# Patient Record
Sex: Male | Born: 1960 | Race: White | Hispanic: No | Marital: Married | Smoking: Former smoker
Health system: Southern US, Community
[De-identification: ages and names within clinical notes are randomized; demographics above are authoritative.]

## PROBLEM LIST (undated history)

## (undated) DIAGNOSIS — M1990 Unspecified osteoarthritis, unspecified site: Secondary | ICD-10-CM

## (undated) DIAGNOSIS — K5900 Constipation, unspecified: Secondary | ICD-10-CM

## (undated) DIAGNOSIS — E119 Type 2 diabetes mellitus without complications: Secondary | ICD-10-CM

## (undated) DIAGNOSIS — Z8546 Personal history of malignant neoplasm of prostate: Secondary | ICD-10-CM

## (undated) DIAGNOSIS — N189 Chronic kidney disease, unspecified: Secondary | ICD-10-CM

## (undated) DIAGNOSIS — E1142 Type 2 diabetes mellitus with diabetic polyneuropathy: Secondary | ICD-10-CM

## (undated) DIAGNOSIS — K219 Gastro-esophageal reflux disease without esophagitis: Secondary | ICD-10-CM

## (undated) DIAGNOSIS — G4730 Sleep apnea, unspecified: Secondary | ICD-10-CM

## (undated) DIAGNOSIS — I739 Peripheral vascular disease, unspecified: Secondary | ICD-10-CM

## (undated) DIAGNOSIS — I10 Essential (primary) hypertension: Secondary | ICD-10-CM

## (undated) DIAGNOSIS — C61 Malignant neoplasm of prostate: Secondary | ICD-10-CM

## (undated) DIAGNOSIS — I639 Cerebral infarction, unspecified: Secondary | ICD-10-CM

## (undated) HISTORY — PX: PROSTATECTOMY: SHX69

## (undated) HISTORY — PX: TAYLOR BUNIONECTOMY: SHX2485

## (undated) HISTORY — PX: WISDOM TOOTH EXTRACTION: SHX21

## (undated) HISTORY — PX: HERNIA REPAIR: SHX51

## (undated) HISTORY — PX: CORONARY ARTERY BYPASS GRAFT: SHX141

---

## 2004-08-09 ENCOUNTER — Ambulatory Visit: Payer: Self-pay | Admitting: Family Medicine

## 2005-03-14 ENCOUNTER — Ambulatory Visit (HOSPITAL_COMMUNITY): Admission: RE | Admit: 2005-03-14 | Discharge: 2005-03-14 | Payer: Self-pay | Admitting: Neurosurgery

## 2005-06-08 ENCOUNTER — Encounter: Admission: RE | Admit: 2005-06-08 | Discharge: 2005-06-08 | Payer: Self-pay | Admitting: Neurosurgery

## 2005-06-20 ENCOUNTER — Encounter: Admission: RE | Admit: 2005-06-20 | Discharge: 2005-06-20 | Payer: Self-pay | Admitting: Neurosurgery

## 2005-10-27 ENCOUNTER — Other Ambulatory Visit: Payer: Self-pay

## 2005-11-02 ENCOUNTER — Ambulatory Visit: Payer: Self-pay | Admitting: Surgery

## 2005-11-02 ENCOUNTER — Other Ambulatory Visit: Payer: Self-pay

## 2006-06-15 ENCOUNTER — Encounter: Payer: Self-pay | Admitting: Orthopedic Surgery

## 2006-06-26 ENCOUNTER — Encounter: Payer: Self-pay | Admitting: Orthopedic Surgery

## 2006-07-27 ENCOUNTER — Encounter: Payer: Self-pay | Admitting: Orthopedic Surgery

## 2012-08-02 ENCOUNTER — Ambulatory Visit: Payer: Self-pay | Admitting: Unknown Physician Specialty

## 2012-08-03 LAB — PATHOLOGY REPORT

## 2012-11-01 ENCOUNTER — Emergency Department: Payer: Self-pay | Admitting: Emergency Medicine

## 2012-11-01 LAB — CBC
MCH: 27.8 pg (ref 26.0–34.0)
MCHC: 33.7 g/dL (ref 32.0–36.0)
MCV: 83 fL (ref 80–100)
Platelet: 223 10*3/uL (ref 150–440)

## 2012-11-01 LAB — COMPREHENSIVE METABOLIC PANEL
Anion Gap: 6 — ABNORMAL LOW (ref 7–16)
Bilirubin,Total: 0.4 mg/dL (ref 0.2–1.0)
Calcium, Total: 9 mg/dL (ref 8.5–10.1)
Chloride: 101 mmol/L (ref 98–107)
Co2: 28 mmol/L (ref 21–32)
Creatinine: 1.32 mg/dL — ABNORMAL HIGH (ref 0.60–1.30)
EGFR (Non-African Amer.): >60
SGPT (ALT): 118 U/L — ABNORMAL HIGH (ref 12–78)
Total Protein: 7.3 g/dL (ref 6.4–8.2)

## 2012-11-01 LAB — CK TOTAL AND CKMB (NOT AT ARMC): CK, Total: 111 U/L (ref 35–232)

## 2012-11-01 LAB — TROPONIN I: Troponin-I: <0.02 ng/mL

## 2012-11-01 LAB — PRO B NATRIURETIC PEPTIDE: B-Type Natriuretic Peptide: 7 pg/mL (ref 0–125)

## 2013-01-08 ENCOUNTER — Ambulatory Visit: Payer: Self-pay | Admitting: Anesthesiology

## 2013-01-09 ENCOUNTER — Ambulatory Visit: Payer: Self-pay | Admitting: Anesthesiology

## 2013-02-15 ENCOUNTER — Ambulatory Visit: Payer: Self-pay | Admitting: Anesthesiology

## 2013-03-06 ENCOUNTER — Observation Stay: Payer: Self-pay | Admitting: Internal Medicine

## 2013-03-06 LAB — LIPID PANEL: VLDL Cholesterol, Calc: 48 mg/dL — ABNORMAL HIGH (ref 5–40)

## 2013-03-06 LAB — BASIC METABOLIC PANEL
Anion Gap: 5 — ABNORMAL LOW (ref 7–16)
BUN: 18 mg/dL (ref 7–18)
Chloride: 104 mmol/L (ref 98–107)
Creatinine: 1.08 mg/dL (ref 0.60–1.30)
EGFR (African American): >60
EGFR (Non-African Amer.): >60
Osmolality: 287 (ref 275–301)

## 2013-03-06 LAB — CBC
HGB: 14.7 g/dL (ref 13.0–18.0)
MCH: 27.8 pg (ref 26.0–34.0)
MCV: 83 fL (ref 80–100)
RBC: 5.29 10*6/uL (ref 4.40–5.90)

## 2013-03-06 LAB — CK TOTAL AND CKMB (NOT AT ARMC)
CK, Total: 112 U/L (ref 35–232)
CK, Total: 77 U/L (ref 35–232)
CK-MB: 2.4 ng/mL (ref 0.5–3.6)

## 2013-03-06 LAB — HEMOGLOBIN A1C: Hemoglobin A1C: 10.3 % — ABNORMAL HIGH (ref 4.2–6.3)

## 2013-03-06 LAB — TROPONIN I: Troponin-I: <0.02 ng/mL

## 2013-03-07 LAB — CBC WITH DIFFERENTIAL/PLATELET
Basophil #: 0.1 10*3/uL (ref 0.0–0.1)
Lymphocyte #: 2.5 10*3/uL (ref 1.0–3.6)
Lymphocyte %: 30.3 %
MCH: 28.1 pg (ref 26.0–34.0)
MCV: 84 fL (ref 80–100)
Monocyte %: 9.3 %
Neutrophil %: 57.4 %
Platelet: 196 10*3/uL (ref 150–440)
WBC: 8.4 10*3/uL (ref 3.8–10.6)

## 2013-03-07 LAB — BASIC METABOLIC PANEL
Anion Gap: 7 (ref 7–16)
BUN: 18 mg/dL (ref 7–18)
Calcium, Total: 8.7 mg/dL (ref 8.5–10.1)
Sodium: 138 mmol/L (ref 136–145)

## 2013-03-07 LAB — CK TOTAL AND CKMB (NOT AT ARMC): CK, Total: 64 U/L (ref 35–232)

## 2013-03-07 LAB — TROPONIN I: Troponin-I: <0.02 ng/mL

## 2013-06-13 ENCOUNTER — Ambulatory Visit: Payer: Self-pay | Admitting: Family Medicine

## 2013-08-26 ENCOUNTER — Ambulatory Visit: Payer: Self-pay | Admitting: Anesthesiology

## 2013-09-23 ENCOUNTER — Ambulatory Visit: Payer: Self-pay | Admitting: Anesthesiology

## 2015-01-16 NOTE — H&P (Signed)
PATIENT NAME:  Chase Harris, Chase Harris MR#:  [id number] DATE OF BIRTH:  05/30/1961  DATE OF ADMISSION:  03/06/2013  ADMITTING PHYSICIAN: Gladstone Lighter, MD   PRIMARY CARE PHYSICIAN: Dion Body, MD    CHIEF COMPLAINT: Chest pain.   HISTORY OF PRESENT ILLNESS: Mr. Shakoor is a 54 year old obese Caucasian male with past medical history significant for hypertension, diabetes, obstructive sleep apnea on CPAP, arthritis and history of prostate cancer, comes to the hospital secondary to chest pain that started this morning. The patient said since he was a diabetic he had a stress test done every year earlier about 5 years ago and then stopped because all of his stress tests were normal. He has not had a stress test done in the past 5 years, has not had chest pain lately up until this morning at 10:30 when he was sitting and talking to his friends and all of a sudden felt like a cramping heavy pain on the left side of the chest radiating to his jaw, and it bothered his right jaw more.  It was not associated with any diaphoresis, dyspnea, nausea or vomiting. It lasted for a good while, and so he presented to the ER. He is started on aspirin, nitro and his first set of troponin is negative.  Because of his risk factors, he is being admitted under observation to rule out angina.   PAST MEDICAL HISTORY: 1.  Chronic low back pain secondary to degenerative disk disease.  2.  Hypertension.  3.  History of chronic bronchitis and emphysema.  4.  Obstructive sleep apnea on continuous positive airway pressure.  5.  Diabetes mellitus.  6.  Rheumatoid arthritis.  7.  History of prostate cancer.   PAST SURGICAL HISTORY:  1.  Prostate cancer surgery.  2.  Hernia surgeries.   ALLERGIES: ALLERGIC TO PENICILLIN.    HOME MEDICATIONS:   1.  Lantus 56 units in the morning and 80 units at bedtime.  2.  Humalog 50-50 mix 18 units subcutaneously twice a day, which he has not been taking for a long time.  3.   Glyburide 10 mg p.o. b.i.d.  4.  Aspirin 81 mg p.o. daily.  5.  Lisinopril/HCTZ 10/12.5 mg 1 tablet p.o. daily.  6.  Loratadine 10 mg p.o. daily.  7.  Metformin 1000 mg in the morning and 1500 mg at bedtime.  8.  Multivitamin 1 tablet daily.  9.  Pravastatin 20 mg p.o. daily.  10.  Tramadol 100 mg every 4 hours as needed for pain.  11.  Vitamin D3, 1 tablet p.o. daily.  12.  Zantac 150 mg p.o. at  bedtime.   SOCIAL HISTORY: Lives at home by himself.  Quit smoking more than 24 years ago.  Currently still working.  Occasional alcohol use.   FAMILY HISTORY: Mom with a-fib and thyroid disease and dad had back problems.   REVIEW OF SYSTEMS:   CONSTITUTIONAL: No fever, fatigue or weakness.  EYES: No blurred vision, double vision, inflammation or glaucoma.  ENT: No tinnitus, ear pain, hearing loss, epistaxis or discharge.  RESPIRATORY: No cough, wheeze, hemoptysis or COPD.  CARDIOVASCULAR: Positive for chest pain.  No orthopnea, edema, arrhythmia, palpitations or syncope.  GASTROINTESTINAL: No nausea, vomiting, diarrhea, abdominal pain, hematemesis or melena.  GENITOURINARY: No dysuria, hematuria, renal calculus, frequency or incontinence.  ENDOCRINE: No polyuria, nocturia, thyroid problems, heat or cold intolerance.  HEMATOLOGY: No anemia, easy bruising or bleeding.  SKIN: No acne, rash or lesions.  MUSCULOSKELETAL: Positive for  small joint pain secondary to his rheumatoid arthritis and chronic back pain with radiculopathy and pain radiating down his calves and both legs.  NEUROLOGICAL: No numbness, weakness, CVA, transient ischemic attack or seizures.  PSYCHOLOGICAL: No anxiety, insomnia or depression.   PHYSICAL EXAMINATION: VITAL SIGNS: Temperature 98.3 degrees Fahrenheit, pulse 88, respirations 20, blood pressure 164/83, pulse oximetry 96% on room air.  GENERAL: A heavily built, well-nourished male sitting in bed, not in any acute distress.  HEENT: Normocephalic, atraumatic. Pupils are  equal, round, reacting to light. Anicteric sclerae. Extraocular movements intact. Oropharynx clear without erythema, mass or exudates.  NECK: Supple. No thyromegaly, JVD or carotid bruits. No lymphadenopathy.  LUNGS: Moving air bilaterally. No wheeze or crackles. No use of accessory muscles for breathing.  CARDIOVASCULAR: S1, S2 regular rate and rhythm. No murmurs, rubs or gallops.  ABDOMEN: Obese, soft, nontender, nondistended. No hepatosplenomegaly. Normal bowel sounds.  EXTREMITIES: No pedal edema. No clubbing or cyanosis. 2+ dorsalis pedis pulses palpable bilaterally.  SKIN: No acne, rash or lesions.  LYMPHATICS: No cervical lymphadenopathy.  NEUROLOGICAL: Cranial nerves intact. No focal motor or sensory deficits.  PSYCHOLOGICAL: The patient is awake, alert, oriented x 3.   LABORATORY AND RADIOLOGICAL DATA:  WBC 7.8, hemoglobin 14.7, hematocrit 44.1, platelet count is 200.  Sodium 136, potassium 4.2, chloride 104, bicarbonate 27, BUN 18, creatinine 1.0, glucose 327, and calcium of 9.3.  First set  of troponin is less than 0.02. D-dimer is negative.   Chest x-ray showing clear lung fields. No acute disease of the chest. EKG normal sinus rhythm, heart rate of 89, no acute ST-T wave abnormalities.   ASSESSMENT AND PLAN: A 54 year old, obese man with history of hypertension, diabetes, hyperlipidemia and obstructive sleep apnea, comes in for chest pain.   1.  Chest pain, likely angina:  Admit under observation to telemetry. First set of troponin is negative. We will recycle further 2 sets and get Myoview in the a.m. Aspirin, nitro and statin will be started.  2.  Hypertension:  Continue his lisinopril/HCTZ at this time.  3.  Diabetes mellitus:  Check hemoglobin A1c. Sugars seem to be uncontrolled. Continue his Lantus b.i.d., sliding scale insulin, metformin and glyburide.  4.  Obstructive sleep apnea: Continue on his CPAP.  5.  Gastrointestinal and deep vein thrombosis prophylaxis on Protonix  and Lovenox.   CODE STATUS:  FULL CODE.     TIME SPENT ON ADMISSION: 50 minutes.    ____________________________ Gladstone Lighter, MD rk:cb D: 03/06/2013 17:17:10 ET T: 03/06/2013 17:33:19 ET JOB#: [id number]  cc: Gladstone Lighter, MD, <Dictator> Dion Body, MD Gladstone Lighter MD ELECTRONICALLY SIGNED 03/19/2013 15:04

## 2015-01-16 NOTE — H&P (Signed)
PATIENT NAME:  Chase Harris, Chase Harris MR#:  [id number] DATE OF BIRTH:  1961/07/23  DATE OF ADMISSION:  01/08/2013  CHIEF COMPLAINT: Chronic low back pain.   PROCEDURE: None.   HISTORY OF PRESENT ILLNESS: The patient is a pleasant 54 year old white male with long-standing history of low back pain. He has been previously seen by Dr. Glenna Fellows over in [place] and this was 8 years ago, at which point he followed there. A previous MRI shows evidence of degenerative disk disease at multiple levels, but this is unavailable to me at this time and has been requested. He has previously been on medication management that has helped with his pain including hydrocodone and Dr. Carloyn Manner is no longer working with him and subsequently he is without medication management at this time. He has been seen by Dr. Netty Starring, who put him on tramadol on this was ineffective. He is basically describing a low back pain that radiates in the posterior lateral legs, especially the calves and anterior thighs. The etiology of the pain is unknown, but has been present for greater than 10 years with a maximum VAS of 10, best of 5, average of 7. It is not influenced by time of day, but worse with sitting and prolonged standing and certain types of rotational movement. Hot packs, medication management and warm showers seem to help the pain. It is worse with daytime cramping and associated with an aching, gnawing type of pain. A previous MRI scan was obtained that shows evidence of multilevel degenerative disk disease and this has been requested. Medication management has helped.   PAST MEDICAL HISTORY: Significant for high blood pressure, bronchitis, history of sleep apnea, history of diabetes, rheumatoid arthritis with a history of prostate cancer.    SOCIAL HISTORY: He is separated with two children. He does not smoke, quit 24 years ago. 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   CURRENT MEDICATIONS: Include loratadine, aspirin low  dose once a day, metformin, hydrochlorothiazide, pravastatin, glyburide, multivitamin, vitamin D3, Lantus, tramadol and Humalog.   ALLERGIES: PENICILLIN.   PHYSICAL EXAMINATION: Reveals a pleasant white male in no acute distress. He is alert and oriented x 3, cooperative and compliant. Heart has regular rate and rhythm. Lungs are clear to auscultation. Pupils equally round and reactive to light. Extraocular muscles are intact. Inspection of the low back reveals some paraspinous muscle tenderness, but this is mild. He does have pain with extension and left lateral rotation in the standing position. This does reproduce some of his pain on the left side. It is also present on the right, but less dramatic than on the left. With the patient in the supine position, he does have calf cramping with straight leg raise bilaterally. Muscle tone and bulk is good. No fasciculations. Sensation is intact throughout the lower extremities. I could not elicit a patella or Achilles reflex on either leg.   ASSESSMENT:  1.  Multilevel degenerative disk disease with chronic low back pain and L5 and L3 radicular symptoms.  2.  Myofascial low back pain.  3.  Facetogenic low back pain.   PLAN:  1.  I am going to schedule him for some physical therapy at the next available date for evaluation and treatment for his low back pain.  2.  He is going to proceed with a lumbar epidural steroid secondary to the constant calf cramping and anterior thigh pain he is experiencing. I do think this would be beneficial to help get his pain under better  control. The risks, benefits of the procedure are reviewed with him in full detail. All questions answered and no guarantees are made. We will plan to do this within the next few days 3.  I am also going to start him on Vicodin 7.5/300 to take 1 or 2 tablets per day for pain control. 4.  He can use anti-inflammatories as well baseline pain control. 5.  He may be a candidate for a diagnostic  facet block as well.   ____________________________ Alvina Filbert. Andree Elk, MD jga:aw D: 01/08/2013 14:28:45 ET T: 01/08/2013 14:36:41 ET JOB#: [id number]  cc: Alvina Filbert. Andree Elk, MD, <Dictator> Dion Body, MD Alvina Filbert Keaisha Sublette MD ELECTRONICALLY SIGNED 01/09/2013 9:05

## 2015-01-16 NOTE — Discharge Summary (Signed)
PATIENT NAME:  Chase Harris, Chase Harris MR#:  [id number] DATE OF BIRTH:  20-Feb-1961  DATE OF ADMISSION:  03/06/2013 DATE OF DISCHARGE:  03/07/2013  REASON FOR ADMISSION: Chest pain.   HISTORY OF PRESENT ILLNESS: Please see the dictated HPI done by Dr. Tressia Miners on 03/06/2013.  PAST MEDICAL HISTORY: 1.  Obesity.  2.  Benign hypertension.  3.  Type 2 diabetes.  4.  Obstructive sleep apnea, on CPAP.  5.  Osteoarthritis.  6.  Prostate cancer.  7.  Degenerative disk disease with chronic back pain.  8.  Rheumatoid arthritis.   MEDICATIONS ON ADMISSION: Please see admission note.   ALLERGIES: PENICILLIN.   SOCIAL AND FAMILY HISTORY AND REVIEW OF SYSTEMS: As per admission note.  PHYSICAL EXAMINATION: The patient was in no acute distress. Vital signs were stable and he was afebrile. HEENT exam was unremarkable. Neck was supple without JVD. Lungs were clear. Cardiac exam revealed a regular rate and rhythm with normal S1 and S2. Abdomen was soft and nontender. Extremities were without edema. Neurologic exam was grossly nonfocal.   LABORATORY AND DIAGNOSTICS: Chest x-ray was unremarkable. EKG revealed no acute ST-T wave changes.   Initial cardiac enzymes were negative. D-dimer was normal.   HOSPITAL COURSE: The patient was admitted with chest pain worrisome for angina. He was observed on telemetry. His enzymes remained negative. The patient underwent stress Myoview testing on 03/07/2013 which was negative with no evidence of ischemia. The patient had no further chest pain. He was subsequently discharged home for further outpatient follow-up. He was discharged in stable condition.   DISCHARGE DIAGNOSES: 1.  Noncardiac chest pain.  2.  Gastroesophageal reflux disease with esophageal spasm.  3.  Obesity.  4.  Benign hypertension.  5.  Type 2 diabetes.  6.  Morbid obesity.  7.  Obstructive sleep apnea, on CPAP.   DISCHARGE MEDICATIONS: 1.  Claritin 10 mg p.o. daily.  2.  Aspirin 81 mg p.o.  daily.  3.  Metformin 1000 mg p.o. q. a.m. and 1500 mg p.o. q. p.m.  4.  Pravastatin 20 mg p.o. at bedtime.  5.  Glyburide 10 mg p.o. b.i.d.  6.  Multivitamin 1 p.o. daily.  7.  Vitamin D3 1000 units p.o. daily.  8.  Tramadol 50 mg 1 to 2 tablets p.o. q. 4 hours p.r.n. pain.  9.  Zestoretic 10/12.5 mg 1 p.o. daily.  10.  Zantac 150 mg p.o. at bedtime.  11.  Humalog 50-50 mix 18 units sub-Q t.i.d. before meals.  12.  Lantus 80 units sub-Q at bedtime.  13.  Lantus 56 units sub-Q every a.m.   FOLLOW-UP PLANS AND APPOINTMENTS: The patient was discharged on a low-sodium, carbohydrate diet. He will follow up with Dr. Netty Starring in 1 to 2 weeks, sooner if needed.  ____________________________ Leonie Douglas Doy Hutching, MD jds:sb D: 03/15/2013 08:00:57 ET T: 03/15/2013 08:16:44 ET JOB#: [id number]  cc: Leonie Douglas. Doy Hutching, MD, <Dictator> JEFFREY Lennice Sites MD ELECTRONICALLY SIGNED 03/24/2013 21:52

## 2015-03-08 ENCOUNTER — Emergency Department: Payer: No Typology Code available for payment source

## 2015-03-08 ENCOUNTER — Emergency Department
Admission: EM | Admit: 2015-03-08 | Discharge: 2015-03-08 | Disposition: A | Discharge status: Home / Self Care | Payer: No Typology Code available for payment source | Attending: Emergency Medicine | Admitting: Emergency Medicine

## 2015-03-08 DIAGNOSIS — E119 Type 2 diabetes mellitus without complications: Secondary | ICD-10-CM | POA: Insufficient documentation

## 2015-03-08 DIAGNOSIS — H538 Other visual disturbances: Secondary | ICD-10-CM | POA: Insufficient documentation

## 2015-03-08 DIAGNOSIS — R51 Headache: Secondary | ICD-10-CM | POA: Diagnosis present

## 2015-03-08 DIAGNOSIS — Z87891 Personal history of nicotine dependence: Secondary | ICD-10-CM | POA: Insufficient documentation

## 2015-03-08 DIAGNOSIS — I10 Essential (primary) hypertension: Secondary | ICD-10-CM | POA: Diagnosis not present

## 2015-03-08 DIAGNOSIS — R519 Headache, unspecified: Secondary | ICD-10-CM

## 2015-03-08 HISTORY — DX: Malignant neoplasm of prostate: C61

## 2015-03-08 HISTORY — DX: Type 2 diabetes mellitus without complications: E11.9

## 2015-03-08 HISTORY — DX: Essential (primary) hypertension: I10

## 2015-03-08 LAB — COMPREHENSIVE METABOLIC PANEL
ALK PHOS: 61 U/L (ref 38–126)
ALT: 59 U/L (ref 17–63)
ANION GAP: 10 (ref 5–15)
AST: 38 U/L (ref 15–41)
Albumin: 3.8 g/dL (ref 3.5–5.0)
BUN: 21 mg/dL — AB (ref 6–20)
CALCIUM: 9.5 mg/dL (ref 8.9–10.3)
CO2: 26 mmol/L (ref 22–32)
Chloride: 104 mmol/L (ref 101–111)
Creatinine, Ser: 1.08 mg/dL (ref 0.61–1.24)
GLUCOSE: 139 mg/dL — AB (ref 65–99)
Potassium: 4.2 mmol/L (ref 3.5–5.1)
Sodium: 140 mmol/L (ref 135–145)
Total Bilirubin: 0.6 mg/dL (ref 0.3–1.2)
Total Protein: 6.9 g/dL (ref 6.5–8.1)

## 2015-03-08 LAB — CBC
HEMATOCRIT: 49 % (ref 40.0–52.0)
Hemoglobin: 16.1 g/dL (ref 13.0–18.0)
MCH: 27.6 pg (ref 26.0–34.0)
MCHC: 32.7 g/dL (ref 32.0–36.0)
MCV: 84.3 fL (ref 80.0–100.0)
Platelets: 215 10*3/uL (ref 150–440)
RBC: 5.82 MIL/uL (ref 4.40–5.90)
RDW: 14.5 % (ref 11.5–14.5)
WBC: 7.3 10*3/uL (ref 3.8–10.6)

## 2015-03-08 LAB — SEDIMENTATION RATE: SED RATE: 3 mm/h (ref 0–20)

## 2015-03-08 MED ORDER — SODIUM CHLORIDE 0.9 % IV SOLN
1000.0000 mL | Freq: Once | INTRAVENOUS | Status: AC
  Administered 2015-03-08: 1000 mL via INTRAVENOUS

## 2015-03-08 MED ORDER — LIDOCAINE-EPINEPHRINE-TETRACAINE (LET) SOLUTION: 3.0000 mL | Freq: Once | NASAL | Status: DC

## 2015-03-08 MED ORDER — TETRACAINE HCL 0.5 % OP SOLN
OPHTHALMIC | Status: AC
  Administered 2015-03-08: 2 [drp] via OPHTHALMIC
  Filled 2015-03-08: qty 2

## 2015-03-08 MED ORDER — DIPHENHYDRAMINE HCL 50 MG/ML IJ SOLN
INTRAMUSCULAR | Status: AC
  Filled 2015-03-08: qty 1

## 2015-03-08 MED ORDER — METOCLOPRAMIDE HCL 5 MG/ML IJ SOLN
20.0000 mg | Freq: Once | INTRAVENOUS | Status: AC
  Administered 2015-03-08: 20 mg via INTRAVENOUS
  Filled 2015-03-08: qty 4

## 2015-03-08 MED ORDER — BUTALBITAL-APAP-CAFFEINE 50-325-40 MG PO TABS: 1.0000 | ORAL_TABLET | Freq: Four times a day (QID) | ORAL | Status: AC | PRN

## 2015-03-08 MED ORDER — TETRACAINE HCL 0.5 % OP SOLN
2.0000 [drp] | Freq: Once | OPHTHALMIC | Status: AC
Start: 2015-03-08 — End: 2015-03-08
  Administered 2015-03-08: 2 [drp] via OPHTHALMIC

## 2015-03-08 MED ORDER — DIPHENHYDRAMINE HCL 50 MG/ML IJ SOLN
25.0000 mg | Freq: Once | INTRAMUSCULAR | Status: AC
  Administered 2015-03-08: 25 mg via INTRAVENOUS

## 2015-03-08 NOTE — ED Notes (Signed)
Pt c/o pain behind the left eye with HA since last Monday, was seen by PCP on Friday and given injection told to take benadryl and told to comes back on Monday if not better to do CT.the patient states the pain was worse today..denies migraine hx.Chase Harris

## 2015-03-08 NOTE — ED Notes (Signed)
Patient transported to CT 

## 2015-03-08 NOTE — ED Provider Notes (Signed)
[facility] Emergency Department Provider Note  ____________________________________________  Time seen: 11:45 AM  I have reviewed the triage vital signs and the nursing notes.   HISTORY  Chief Complaint Headache    HPI Chase Harris is a 54 y.o. male who presents with a headache. Patient notes that the pain is moderate, pressure-like in nature and behind his left eye. He has had this headache for a week. He saw his primary care physician who gave him a shot of medication and told to take Benadryl and Phenergan but it did not help. Patient is requesting a CT scan as he says he does not get headaches frequently. He has not had any fevers or chills. He has no neck pain either. No neuro deficits. No recent trauma. No blood thinners. He does not smoke     Past Medical History  Diagnosis Date  . Diabetes mellitus without complication   . Prostate cancer   . Hypertension     There are no active problems to display for this patient.   Past Surgical History  Procedure Laterality Date  . Prostatectomy    . Hernia repair      No current outpatient prescriptions on file.  Allergies Dynabac and Penicillins  No family history on file.  Social History History  Substance Use Topics  . Smoking status: Former Research scientist (life sciences)  . Smokeless tobacco: Never Used  . Alcohol Use: No    Review of Systems  Constitutional: Negative for fever. Eyes: Left eye feels blurry ENT: Negative for sore throat Cardiovascular: Negative for chest pain. Respiratory: Negative for shortness of breath. Gastrointestinal: Negative for abdominal pain, vomiting and diarrhea. Genitourinary: Negative for dysuria. Musculoskeletal: Negative for back pain. Skin: Negative for rash. Neurological: Positive for headache, negative for focal weakness   10-point ROS otherwise negative.  ____________________________________________   PHYSICAL EXAM:  VITAL SIGNS: ED Triage Vitals   Enc Vitals Group     BP 03/08/15 1131 165/75 mmHg     Pulse Rate 03/08/15 1131 98     Resp 03/08/15 1131 18     Temp 03/08/15 1131 98.7 F (37.1 C)     Temp Source 03/08/15 1131 Oral     SpO2 03/08/15 1131 94 %     Weight 03/08/15 1131 226 lb (102.513 kg)     Height 03/08/15 1131 _0  (1.753 m)     Head Cir --      Peak Flow --      Pain Score 03/08/15 1132 6     Pain Loc --      Pain Edu? --      Excl. in [place]? --      Constitutional: Alert and oriented. Well appearing and in no distress. Eyes: Conjunctivae are normal. PERRL. Tono-Pen used to check pressure. Highest pressure was 19 in the left eye. Eye exam otherwise normal.  ENT   Head: Normocephalic and atraumatic.   Nose: No rhinnorhea.   Mouth/Throat: Mucous membranes are moist. Cardiovascular: Normal rate, regular rhythm. Normal and symmetric distal pulses are present in all extremities. No murmurs, rubs, or gallops. Respiratory: Normal respiratory effort without tachypnea nor retractions. Breath sounds are clear and equal bilaterally.  Gastrointestinal: Soft and non-tender in all quadrants. No distention. There is no CVA tenderness. Genitourinary: deferred Musculoskeletal: Nontender with normal range of motion in all extremities. No lower extremity tenderness nor edema. Neurologic:  Normal speech and language. No gross focal neurologic deficits are appreciated. Skin:  Skin is warm, dry and  intact. No rash noted. Psychiatric: Mood and affect are normal. Patient exhibits appropriate insight and judgment.  ____________________________________________    LABS (pertinent positives/negatives)  Labs Reviewed  COMPREHENSIVE METABOLIC PANEL - Abnormal; Notable for the following:    Glucose, Bld 139 (*)    BUN 21 (*)    All other components within normal limits  CBC  SEDIMENTATION RATE    ____________________________________________   EKG  None  ____________________________________________     RADIOLOGY  CT head unremarkable  ____________________________________________   PROCEDURES  Procedure(s) performed: none  Critical Care performed: none  ____________________________________________   INITIAL IMPRESSION / ASSESSMENT AND PLAN / ED COURSE  Pertinent labs & imaging results that were available during my care of the patient were reviewed by me and considered in my medical decision making (see chart for details).  Patient given IV Reglan, IV Benadryl and normal saline and he reports significant improvement in headache. His CT scan is reassuring. His ESR is normal. Using the Tono-Pen I checked the pressure in the left eye which was also normal. Given that he is feeling similar feeling better and has excellent outpatient follow-up I will discharge him with fioricet prescription  ____________________________________________   FINAL CLINICAL IMPRESSION(S) / ED DIAGNOSES  Final diagnoses:  Acute nonintractable headache, unspecified headache type     Lavonia Drafts, MD 03/08/15 1416

## 2015-03-08 NOTE — ED Notes (Addendum)
Headache behind left eye since 03/02/15- no vomiting. Mild nausea. No light or photo sensitivity. Pt alert and oriented X4, active, cooperative, pt in NAD. RR even and unlabored, color WNL.

## 2015-03-08 NOTE — Discharge Instructions (Signed)

## 2015-03-11 ENCOUNTER — Other Ambulatory Visit: Payer: Self-pay | Admitting: Family Medicine

## 2015-03-11 DIAGNOSIS — R519 Headache, unspecified: Secondary | ICD-10-CM

## 2015-03-11 DIAGNOSIS — R51 Headache: Principal | ICD-10-CM

## 2015-03-12 ENCOUNTER — Other Ambulatory Visit: Payer: Self-pay | Admitting: Family Medicine

## 2015-03-12 ENCOUNTER — Ambulatory Visit
Admission: RE | Admit: 2015-03-12 | Discharge: 2015-03-12 | Disposition: A | Discharge status: Home / Self Care | Payer: No Typology Code available for payment source | Source: Ambulatory Visit | Attending: Family Medicine | Admitting: Family Medicine

## 2015-03-12 DIAGNOSIS — R51 Headache: Principal | ICD-10-CM

## 2015-03-12 DIAGNOSIS — R519 Headache, unspecified: Secondary | ICD-10-CM

## 2015-03-12 DIAGNOSIS — Z880 Allergy status to penicillin: Secondary | ICD-10-CM | POA: Diagnosis not present

## 2015-03-12 DIAGNOSIS — J0100 Acute maxillary sinusitis, unspecified: Secondary | ICD-10-CM | POA: Diagnosis not present

## 2015-03-12 DIAGNOSIS — Z888 Allergy status to other drugs, medicaments and biological substances status: Secondary | ICD-10-CM | POA: Diagnosis not present

## 2015-03-12 DIAGNOSIS — G9389 Other specified disorders of brain: Secondary | ICD-10-CM | POA: Diagnosis not present

## 2015-03-12 MED ORDER — GADOBENATE DIMEGLUMINE 529 MG/ML IV SOLN
20.0000 mL | Freq: Once | INTRAVENOUS | Status: AC | PRN
  Administered 2015-03-12: 20 mL via INTRAVENOUS

## 2015-03-13 ENCOUNTER — Other Ambulatory Visit: Payer: Self-pay | Admitting: Neurology

## 2015-03-13 DIAGNOSIS — R519 Headache, unspecified: Secondary | ICD-10-CM

## 2015-03-13 DIAGNOSIS — G8929 Other chronic pain: Secondary | ICD-10-CM

## 2015-03-13 DIAGNOSIS — R51 Headache: Principal | ICD-10-CM

## 2015-03-16 ENCOUNTER — Ambulatory Visit
Admission: RE | Admit: 2015-03-16 | Discharge: 2015-03-16 | Disposition: A | Discharge status: Home / Self Care | Payer: No Typology Code available for payment source | Source: Ambulatory Visit | Attending: Neurology | Admitting: Neurology

## 2015-03-16 DIAGNOSIS — I679 Cerebrovascular disease, unspecified: Secondary | ICD-10-CM | POA: Insufficient documentation

## 2015-03-16 DIAGNOSIS — R51 Headache: Secondary | ICD-10-CM | POA: Diagnosis not present

## 2015-03-16 DIAGNOSIS — G8929 Other chronic pain: Secondary | ICD-10-CM

## 2015-03-16 DIAGNOSIS — R519 Headache, unspecified: Secondary | ICD-10-CM

## 2015-03-16 MED ORDER — IOHEXOL 240 MG/ML SOLN: 25.0000 mL | Freq: Once | INTRAMUSCULAR | Status: DC | PRN

## 2015-03-16 MED ORDER — IOHEXOL 350 MG/ML SOLN
80.0000 mL | Freq: Once | INTRAVENOUS | Status: AC | PRN
  Administered 2015-03-16: 80 mL via INTRAVENOUS

## 2015-03-17 DIAGNOSIS — R519 Headache, unspecified: Secondary | ICD-10-CM | POA: Insufficient documentation

## 2015-03-17 DIAGNOSIS — R51 Headache: Secondary | ICD-10-CM

## 2016-03-21 DIAGNOSIS — M79642 Pain in left hand: Secondary | ICD-10-CM

## 2016-03-21 DIAGNOSIS — E119 Type 2 diabetes mellitus without complications: Secondary | ICD-10-CM | POA: Insufficient documentation

## 2016-03-21 DIAGNOSIS — M79641 Pain in right hand: Secondary | ICD-10-CM | POA: Insufficient documentation

## 2016-03-21 DIAGNOSIS — M1990 Unspecified osteoarthritis, unspecified site: Secondary | ICD-10-CM | POA: Insufficient documentation

## 2016-03-21 DIAGNOSIS — M0600 Rheumatoid arthritis without rheumatoid factor, unspecified site: Secondary | ICD-10-CM | POA: Insufficient documentation

## 2016-04-04 DIAGNOSIS — Z79899 Other long term (current) drug therapy: Secondary | ICD-10-CM | POA: Insufficient documentation

## 2016-05-29 IMAGING — MR MR HEAD WO/W CM
10 of 13 series · 37 of 48 positions shown · IV contrast (multihance)
Comparison: None.

CLINICAL DATA: Acute intractable headache with vision changes and
confusion.

EXAM:
MRI HEAD WITHOUT AND WITH CONTRAST
TECHNIQUE: Multiplanar, multiecho pulse sequences of the brain and surrounding
structures were obtained without and with intravenous contrast.
CONTRAST:  20mL MULTIHANCE GADOBENATE DIMEGLUMINE 529 MG/ML IV SOLN

[Series 4: DWI · axial · 4.0mm · 0.94mm/px · z∈[-77,+97]mm · 4 of 45 slices shown (1 of 4)]
[im 1/45]
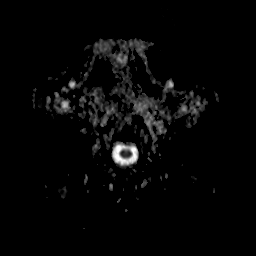
[im 15/45]
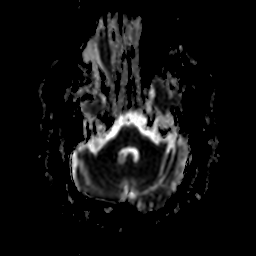
[im 30/45]
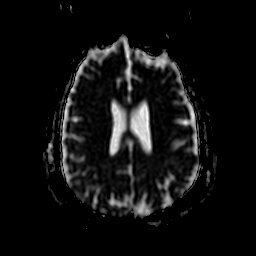
[im 45/45]
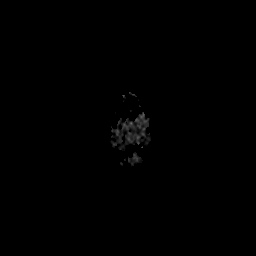

[Series 5: DWI · axial · 4.0mm · 0.94mm/px · z∈[-77,+97]mm · 4 of 45 slices shown (2 of 4)]
[im 1/45]
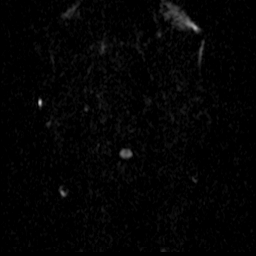
[im 15/45]
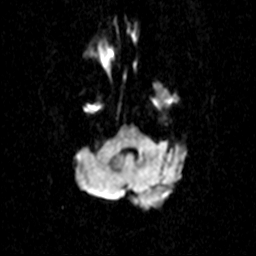
[im 30/45]
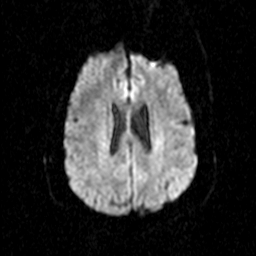
[im 45/45]
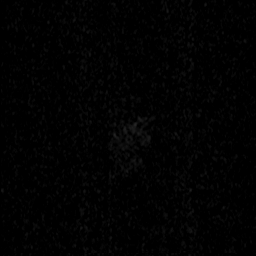

[Series 7: DWI · coronal · 5.0mm · 1.80mm/px · 4 of 41 slices shown (3 of 4)]
[im 1/41]
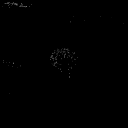
[im 14/41]
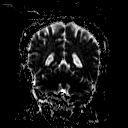
[im 27/41]
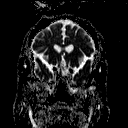
[im 41/41]
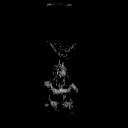

[Series 8: DWI · coronal · 5.0mm · 1.80mm/px · 4 of 38 slices shown (4 of 4)]
[im 1/38]
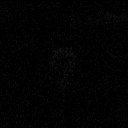
[im 13/38]
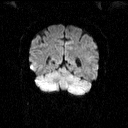
[im 25/38]
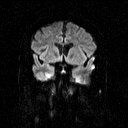
[im 38/38]
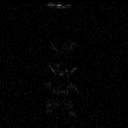

[Series 9: T2 · axial · 5.0mm · 0.45mm/px · z∈[-72,+96]mm · 3 of 27 slices shown (1 of 2)]
[im 1/27]
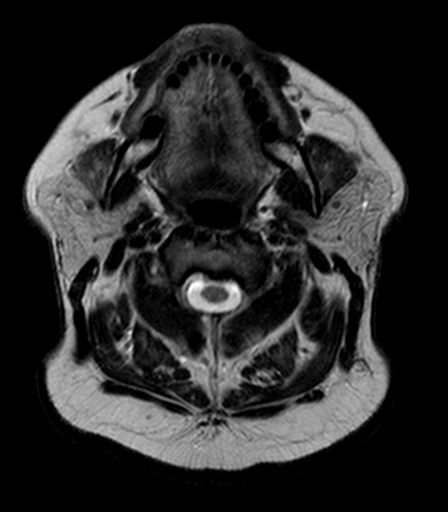
[im 14/27]
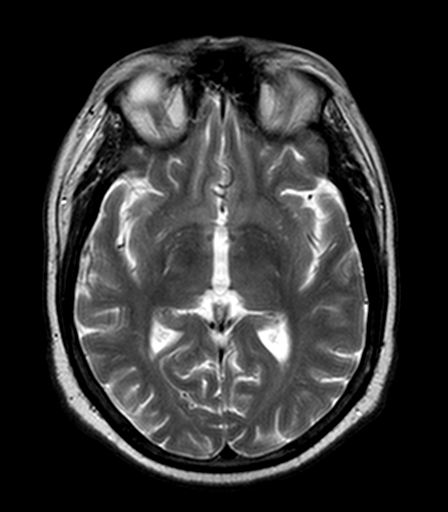
[im 27/27]
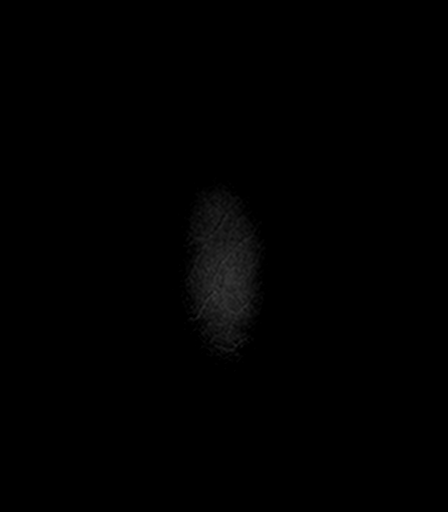

[Series 10: FLAIR · axial · 5.0mm · 0.90mm/px · z∈[-72,+96]mm · 3 of 27 slices shown]
[im 1/27]
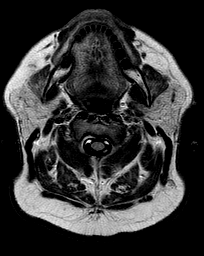
[im 14/27]
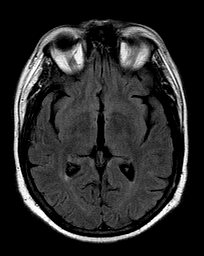
[im 27/27]
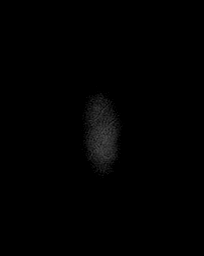

[Series 11: T2 · axial · 5.0mm · 0.45mm/px · z∈[-72,+96]mm · 3 of 27 slices shown (2 of 2)]
[im 1/27]
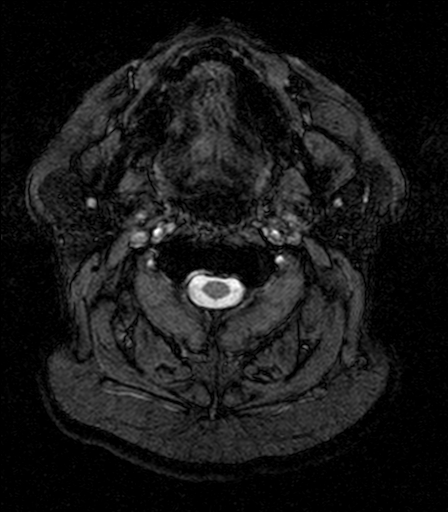
[im 14/27]
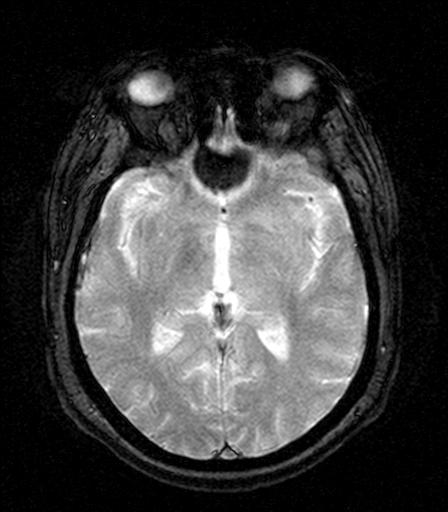
[im 27/27]
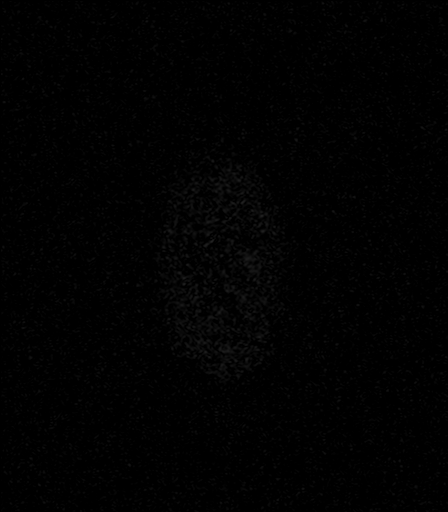

[Series 14: T1 post-contrast · axial · 3.0mm · 0.45mm/px · z∈[-82,+106]mm · 6 of 64 slices shown (1 of 3)]
[im 1/64]
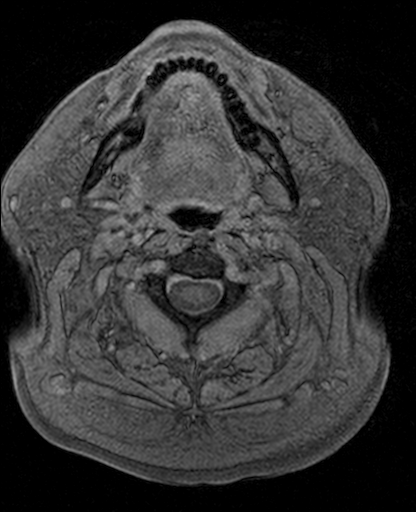
[im 13/64]
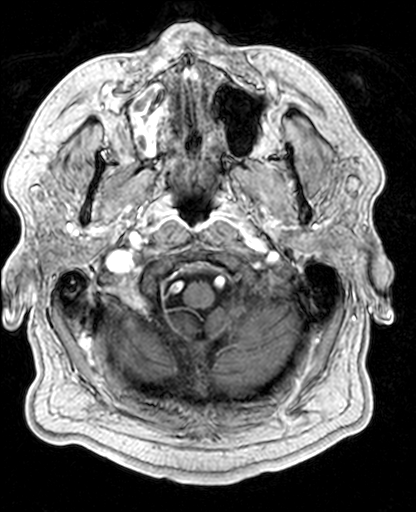
[im 26/64]
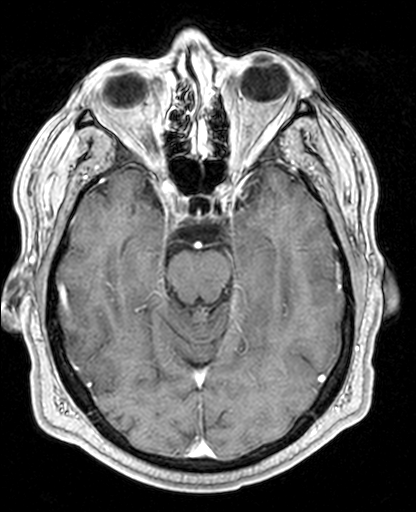
[im 38/64]
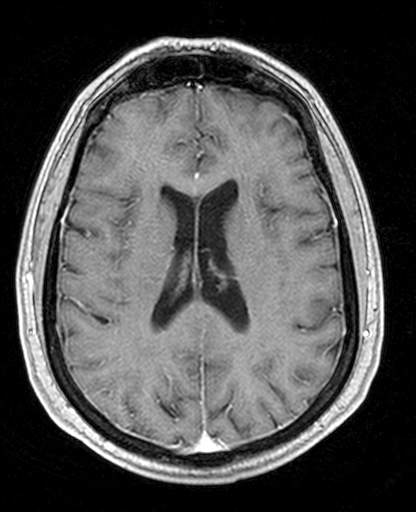
[im 51/64]
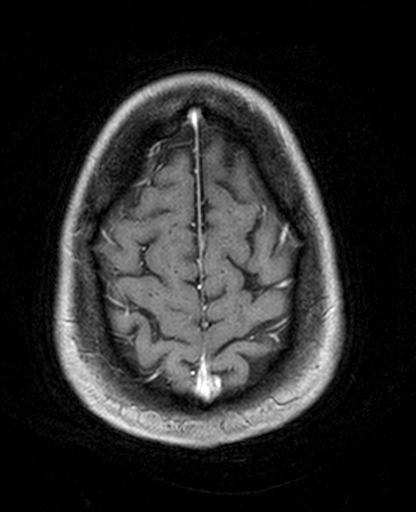
[im 64/64]
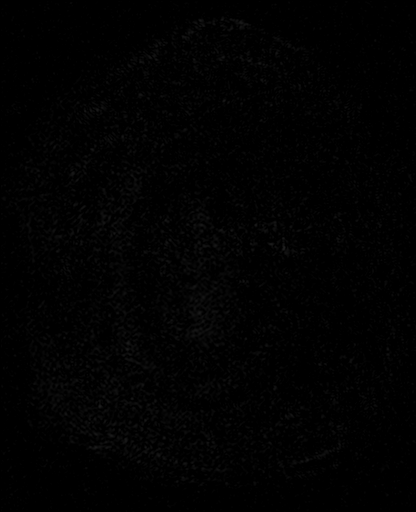

[Series 15: T1 post-contrast · coronal · 5.0mm · 0.45mm/px · 3 of 32 slices shown (2 of 3)]
[im 1/32]
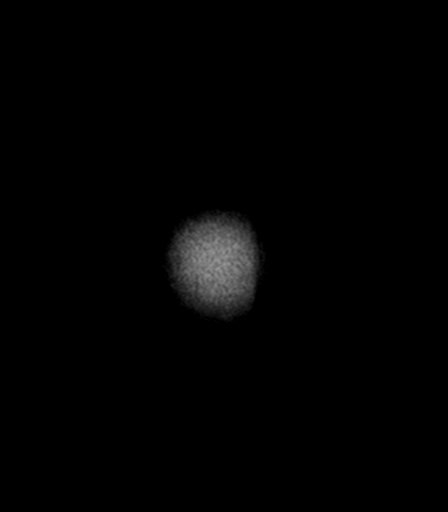
[im 16/32]
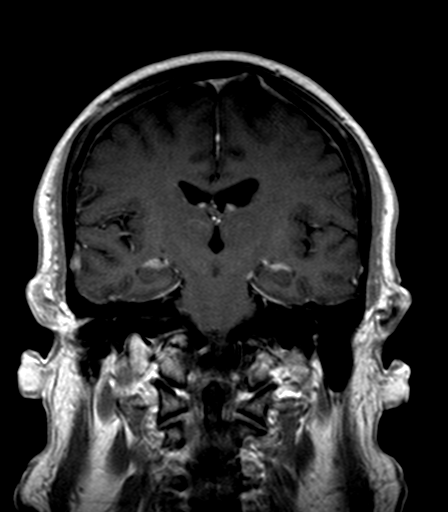
[im 32/32]
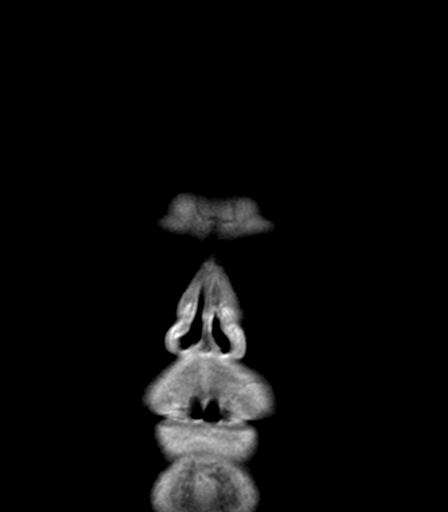

[Series 16: T1 post-contrast · sagittal · 5.0mm · 0.45mm/px · 3 of 29 slices shown (3 of 3)]
[im 1/29]
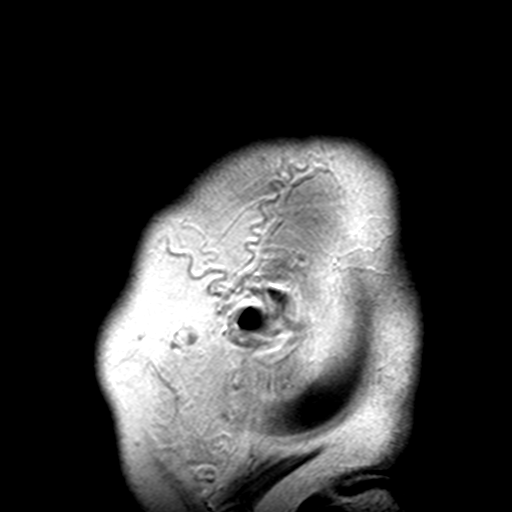
[im 15/29]
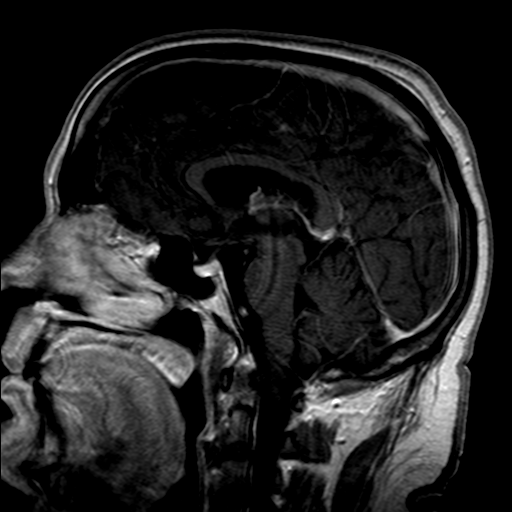
[im 29/29]
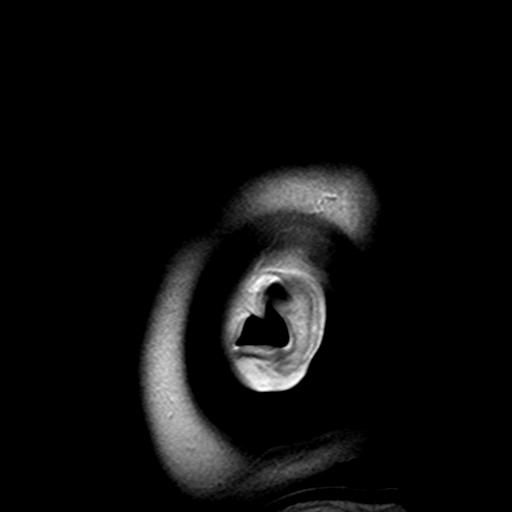

[37 of 48 positions shown; findings below may reference images not displayed]

FINDINGS: Calvarium and upper cervical spine: No focal marrow signal
abnormality.

Orbits: Negative.

Sinuses and Mastoids: Mucosal thickening and fluid level in the
right maxillary sinus. No evidence of vascular or intracranial
complication to explain the symptoms.

Brain:

Small area of gliosis in the superficial right temporal occipital
cortex, likely remote infarct. Slight irregularity of the right V4
segment, just before the PICA origin, could reflect atherosclerosis
and be related.

Few scattered T2 and FLAIR hyperintense foci in the bilateral
cerebral white matter, acceptable for age.

No acute infarct, hemorrhage, hydrocephalus, or mass lesion. No
evidence of large vessel occlusion.
IMPRESSION: 1. No acute intracranial findings.
2. Acute right maxillary sinusitis.
3. Small area of right temporal occipital cortex gliosis, likely
remote infarct.

## 2016-06-13 DIAGNOSIS — M65351 Trigger finger, right little finger: Secondary | ICD-10-CM | POA: Insufficient documentation

## 2016-06-23 ENCOUNTER — Telehealth: Payer: Self-pay | Admitting: *Deleted

## 2016-06-23 ENCOUNTER — Encounter: Payer: Self-pay | Admitting: Podiatry

## 2016-06-23 ENCOUNTER — Ambulatory Visit (INDEPENDENT_AMBULATORY_CARE_PROVIDER_SITE_OTHER): Payer: BLUE CROSS/BLUE SHIELD | Admitting: Podiatry

## 2016-06-23 ENCOUNTER — Ambulatory Visit (INDEPENDENT_AMBULATORY_CARE_PROVIDER_SITE_OTHER): Payer: BLUE CROSS/BLUE SHIELD

## 2016-06-23 DIAGNOSIS — R52 Pain, unspecified: Secondary | ICD-10-CM

## 2016-06-23 DIAGNOSIS — M7742 Metatarsalgia, left foot: Secondary | ICD-10-CM

## 2016-06-23 DIAGNOSIS — M21629 Bunionette of unspecified foot: Secondary | ICD-10-CM | POA: Diagnosis not present

## 2016-06-23 NOTE — Telephone Encounter (Addendum)
-----   Message from Trula Slade, DPM sent at 06/23/2016  1:51 PM EDT ----- Can you please order arterial studies for him due to vessel calcification  on xray. Thanks. Faxed to [facility].

## 2016-06-23 NOTE — Progress Notes (Signed)
Subjective:     Patient ID: Chase Harris, male   DOB: Mar 09, 1961, 55 y.o.   MRN: [id number]  HPI 55 year old male presents the office they for concerns of pain to the left foot which is been ongoing for quite some time however over the last several weeks he states he is a pain in the ball the foot after he walks more than 10-15 minutes. He states he is an avid hiker and walker and he feels that he is walking on a stone or a bruise to the pad of his foot. He denies any recent injury or trauma. Denies any any swelling or redness. He is in no recent treatment for this. He is diabetic and his last A1c was 7.5. Denies any claudication symptoms. No other complaints at this time.  Review of Systems  All other systems reviewed and are negative.      Objective:   Physical Exam General: AAO x3, NAD  Dermatological: Skin is warm, dry and supple bilateral. Nails x 10 are well manicured; remaining integument appears unremarkable at this time. There are no open sores, no preulcerative lesions, no rash or signs of infection present.  Vascular: Dorsalis Pedis artery and Posterior Tibial artery pedal pulses are 2/4 bilateral with immedate capillary fill time. Pedal hair growth present.No lower extremity edema is present. There is no pain with calf compression, swelling, warmth, erythema.   Neruologic: Sensation mildly decreased with Chase Harris monofilament  Musculoskeletal: There is prominence of the metatarsal heads plantarly with atrophy of the fat pad. There is mild tenderness along submetatarsal one to 3. There is no palpable neuroma identified. Mild tailors bunion is present and sufficiently he does get tenderness to lateral aspect of the fifth metatarsal head as well. There is no area pinpoint bony tenderness or pain the vibratory sensation. No pain on the dorsal aspect of the metatarsals. There is no overlying edema, erythema, increase in warmth. No other areas of tenderness bilaterally. MMT/5.  Range of motion intact.  Gait: Unassisted, Nonantalgic.      Assessment:     55 year old male left foot metatarsalgia, mild tailors bunion; vessel calcifications on x-ray    Plan:     -Treatment options discussed including all alternatives, risks, and complications -Etiology of symptoms were discussed -X-rays were obtained and reviewed with the patient. 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 Calcaneal spurring is present. No evidence of acute fracture. Vessel calcifications noted. -Metatarsal offloading pads were dispensed as well as offloading pad for the tailors bunion. Discussed with him the orthotic. Also discussed shoe gear modifications. -Given the vessel calcification we'll obtain arterial studies have baseline studies. -Follow-up in 4 weeks or sooner if needed. Call any questions or concerns meantime.  Chase Harris, DPM

## 2016-07-11 ENCOUNTER — Ambulatory Visit (INDEPENDENT_AMBULATORY_CARE_PROVIDER_SITE_OTHER): Payer: BLUE CROSS/BLUE SHIELD

## 2016-07-11 DIAGNOSIS — R52 Pain, unspecified: Secondary | ICD-10-CM

## 2016-07-11 DIAGNOSIS — M79609 Pain in unspecified limb: Secondary | ICD-10-CM | POA: Diagnosis not present

## 2016-07-12 NOTE — Telephone Encounter (Addendum)
-----   Message from Trula Slade, DPM sent at 07/12/2016 10:11 AM EDT ----- Please let him know that his arterial studies came back abnormal and there was some blockage. Please schedule him an appointment at [facility] for further evaluation. Informed pt of Dr. Leigh Aurora review of results and referral to [facility].

## 2016-07-13 ENCOUNTER — Encounter (INDEPENDENT_AMBULATORY_CARE_PROVIDER_SITE_OTHER): Payer: Self-pay

## 2016-07-18 ENCOUNTER — Encounter (INDEPENDENT_AMBULATORY_CARE_PROVIDER_SITE_OTHER): Payer: BLUE CROSS/BLUE SHIELD | Admitting: Vascular Surgery

## 2016-07-21 ENCOUNTER — Ambulatory Visit (INDEPENDENT_AMBULATORY_CARE_PROVIDER_SITE_OTHER): Payer: BLUE CROSS/BLUE SHIELD | Admitting: Podiatry

## 2016-07-21 ENCOUNTER — Encounter: Payer: Self-pay | Admitting: Podiatry

## 2016-07-21 ENCOUNTER — Telehealth: Payer: Self-pay | Admitting: *Deleted

## 2016-07-21 DIAGNOSIS — L84 Corns and callosities: Secondary | ICD-10-CM | POA: Diagnosis not present

## 2016-07-21 DIAGNOSIS — M216X2 Other acquired deformities of left foot: Secondary | ICD-10-CM | POA: Diagnosis not present

## 2016-07-21 DIAGNOSIS — M21629 Bunionette of unspecified foot: Secondary | ICD-10-CM | POA: Diagnosis not present

## 2016-07-21 DIAGNOSIS — R0989 Other specified symptoms and signs involving the circulatory and respiratory systems: Secondary | ICD-10-CM

## 2016-07-21 DIAGNOSIS — M79604 Pain in right leg: Secondary | ICD-10-CM

## 2016-07-21 DIAGNOSIS — M79605 Pain in left leg: Principal | ICD-10-CM

## 2016-07-21 NOTE — Telephone Encounter (Addendum)
-----   Message from Trula Slade, DPM sent at 07/21/2016 11:13 AM EDT ----- Can you please put in a referral for Dr. Gwenlyn Found? He is scheduled at [facility] but would like to see Dr. Gwenlyn Found. He had abnormal arterial studies and he has leg pain.   Also, he was told he needs a cardiac stress test and has a family history of coronary artery disease so I figured it would be best to see Dr. Gwenlyn Found. Faxed referral, pt clinicals and demographics to Dr. Kennon Holter office. 07/28/2016-Pt states he has not received a call from Dr. Kennon Holter office. I reviewed pt's Appt Referrals area and it said he was ready for scheduling. I left message informing pt that his records had be reviewed and he was ready to schedule and he could call for the appt, left [facility] appt line.

## 2016-07-28 ENCOUNTER — Encounter (INDEPENDENT_AMBULATORY_CARE_PROVIDER_SITE_OTHER): Payer: BLUE CROSS/BLUE SHIELD | Admitting: Vascular Surgery

## 2016-07-31 NOTE — Progress Notes (Signed)
Subjective: 55 year old male presents the office today for follow-up evaluation of left foot pain. He states it is about the same as to what it was last appointment. He had his vascular testing performed which should come back abnormal he has an upcoming appointment with vein and vascular. He has one spot of the bottom of his left foot that has become painfully points of submetatarsal 5. Denies any open sores denies any redness or drainage or any swelling to his foot. He states he does get cramping and pain to his thigh into his legs at times. Denies any further claudication symptoms.  Denies any systemic complaints such as fevers, chills, nausea, vomiting. No acute changes since last appointment, and no other complaints at this time.   Objective: AAO x3, NAD DP/PT pulses palpable bilaterally, CRT less than 3 seconds Minimal hyperkeratotic tissue present left foot metatarsal 5. Upon debridement no underlying ulceration, drainage or signs of infection. There is tenderness palpation of this area. Mild tailors bunion is also present. There is no other areas of tenderness no other areas of pinpoint bony tenderness or pain the vibratory sensation. There is prominent metatarsal heads plantarly and atrophy of fat pad. No edema, erythema, increase in warmth to bilateral lower extremities.  No open lesions or pre-ulcerative lesions.  No pain with calf compression, swelling, warmth, erythema  Assessment: Submetatarsal 5 pain left foot  Plan: -All treatment options discussed with the patient including all alternatives, risks, complications.  -Hyperkeratotic lesion was debrided without complications or bleeding. Dispensed metatarsal offloading pad as well as pad for tailors bunion. Discussed orthotics in the future. -Follow-up of an vascular due to abnormal arterial studies -Patient encouraged to call the office with any questions, concerns, change in symptoms.   Celesta Gentile, DPM

## 2016-08-05 ENCOUNTER — Encounter (INDEPENDENT_AMBULATORY_CARE_PROVIDER_SITE_OTHER): Payer: BLUE CROSS/BLUE SHIELD | Admitting: Vascular Surgery

## 2016-08-08 ENCOUNTER — Telehealth: Payer: Self-pay | Admitting: Cardiovascular Disease

## 2016-08-08 NOTE — Telephone Encounter (Signed)
Records received from [facility] for apt on 08/23/16 with Dr Gwenlyn Found. Records given to Nenita h (medical records) CN

## 2016-08-22 ENCOUNTER — Encounter: Payer: Self-pay | Admitting: *Deleted

## 2016-08-22 ENCOUNTER — Other Ambulatory Visit: Payer: Self-pay | Admitting: *Deleted

## 2016-08-22 DIAGNOSIS — I10 Essential (primary) hypertension: Secondary | ICD-10-CM | POA: Insufficient documentation

## 2016-08-22 DIAGNOSIS — E6609 Other obesity due to excess calories: Secondary | ICD-10-CM | POA: Insufficient documentation

## 2016-08-22 DIAGNOSIS — Z8546 Personal history of malignant neoplasm of prostate: Secondary | ICD-10-CM | POA: Insufficient documentation

## 2016-08-22 DIAGNOSIS — E119 Type 2 diabetes mellitus without complications: Secondary | ICD-10-CM

## 2016-08-22 DIAGNOSIS — IMO0001 Reserved for inherently not codable concepts without codable children: Secondary | ICD-10-CM | POA: Insufficient documentation

## 2016-08-22 DIAGNOSIS — Z794 Long term (current) use of insulin: Secondary | ICD-10-CM

## 2016-08-22 DIAGNOSIS — E782 Mixed hyperlipidemia: Secondary | ICD-10-CM | POA: Insufficient documentation

## 2016-08-22 DIAGNOSIS — G4730 Sleep apnea, unspecified: Secondary | ICD-10-CM | POA: Insufficient documentation

## 2016-08-23 ENCOUNTER — Ambulatory Visit (INDEPENDENT_AMBULATORY_CARE_PROVIDER_SITE_OTHER): Payer: BLUE CROSS/BLUE SHIELD | Admitting: Cardiovascular Disease

## 2016-08-23 ENCOUNTER — Encounter: Payer: Self-pay | Admitting: Cardiovascular Disease

## 2016-08-23 VITALS — BP 124/75 | HR 87 | Ht 69.0 in | Wt 241.8 lb

## 2016-08-23 DIAGNOSIS — I739 Peripheral vascular disease, unspecified: Secondary | ICD-10-CM

## 2016-08-23 DIAGNOSIS — I10 Essential (primary) hypertension: Secondary | ICD-10-CM | POA: Diagnosis not present

## 2016-08-23 DIAGNOSIS — I70219 Atherosclerosis of native arteries of extremities with intermittent claudication, unspecified extremity: Secondary | ICD-10-CM | POA: Insufficient documentation

## 2016-08-23 NOTE — Patient Instructions (Signed)
Medication Instructions: Your physician recommends that you continue on your current medications as directed. Please refer to the Current Medication list given to you today.   Follow-Up: Your physician recommends that you schedule a follow-up appointment as needed with Dr. Berry.  If you need a refill on your cardiac medications before your next appointment, please call your pharmacy.  

## 2016-08-23 NOTE — Assessment & Plan Note (Signed)
Chase Harris was referred to me by Dr. Jacqualyn Posey for evaluation of PAD. He has a history of hypertension and diabetes. He has chronic pain in both lower extremities there regardless of activity when he goes to sleep and wakes up, stands up and ambulates. He has seen Dr. Jacqualyn Posey for a painful left foot. Dopplers performed at [facility] were essentially normal with medial calcification and triphasic waveforms. He did have a moderately elevated velocity in his right popliteal artery but really denies claudication. At this point, I see no indication for intervention and recommend conservative therapy.

## 2016-08-23 NOTE — Progress Notes (Signed)
08/23/2016 Chase Harris   1960/12/07  [id number]  Primary Physician Chase Body, MD Primary Cardiologist: Chase Harp MD Chase Harris  HPI:  [name] is a 55 year old moderately overweight married Caucasian male father of 2, grandfather and 2 grandchildren accompanied by his wife Chase Harris  today. 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 He has a history of hypertension, hyperlipidemia, diabetes with hemoglobin A1c in the 7 range. He has had a stroke June 2016 that had brief ophthalmologic sequela. He's never had a heart attack. He denies chest pain or shortness of breath. He saw Dr. Jacqualyn Harris for left foot pain. Dopplers performed at [facility] were essentially normal triphasic waveforms are void down to his tibial arteries. He had a moderately elevated velocity is right popliteal artery. He gives no symptoms of claudication and has no evidence of skin breakdown.   Current Outpatient Prescriptions  Medication Sig Dispense Refill  . aspirin EC 81 MG tablet Take by mouth.    . canagliflozin (INVOKANA) 300 MG TABS tablet Take by mouth.    . fluticasone (FLONASE) 50 MCG/ACT nasal spray Place into the nose.    . gabapentin (NEURONTIN) 300 MG capsule   4  . glyBURIDE (DIABETA) 5 MG tablet   10  . hydroxychloroquine (PLAQUENIL) 200 MG tablet Take by mouth daily.    . insulin aspart (NOVOLOG) 100 UNIT/ML FlexPen Inject into the skin.    [name] insulin aspart protamine - aspart (NOVOLOG 70/30 MIX) (70-30) 100 UNIT/ML FlexPen Inject into the skin.    . Insulin Detemir (LEVEMIR) 100 UNIT/ML Pen Inject into the skin.    . Insulin Glargine (LANTUS SOLOSTAR) 100 UNIT/ML Solostar Pen 80 units sq qhs    . Insulin Pen Needle (B-D ULTRAFINE III SHORT PEN) 31G X 8 MM MISC USE THREE TIMES A DAY    . leflunomide (ARAVA) 20 MG tablet   1  . lisinopril-hydrochlorothiazide (PRINZIDE,ZESTORETIC) 10-12.5 MG tablet Take by mouth.    .  loratadine (CLARITIN) 10 MG tablet Take by mouth.    [name] LYRICA 75 MG capsule   0  . metFORMIN (GLUCOPHAGE) 1000 MG tablet TAKE 1 TABLET IN  THE MORNING AND TAKE 1 AND 1/2 TABLET IN THE EVENING    . Multiple Vitamin (MULTI-VITAMINS) TABS Take by mouth.    . nortriptyline (PAMELOR) 25 MG capsule Take by mouth.    . ranitidine (ZANTAC) 150 MG tablet Take by mouth.     No current facility-administered medications for this visit.     Allergies  Allergen Reactions  . Penicillins Rash    At birth  . Dynabac [Dirithromycin] Nausea And Vomiting    Other reaction(s): Abdominal Pain    Social History   Social History  . Marital status: Divorced    Spouse name: N/A  . Number of children: N/A  . Years of education: N/A   Occupational History  . Not on file.   Social History Main Topics  . Smoking status: Former Research scientist (life sciences)  . Smokeless tobacco: Never Used  . Alcohol use No  . Drug use: No  . Sexual activity: Yes   Other Topics Concern  . Not on file   Social History Narrative  . No narrative on file     Review of Systems: General: negative for chills, fever, night sweats or weight changes.  Cardiovascular: negative for chest pain, dyspnea on exertion, edema, orthopnea, palpitations, paroxysmal nocturnal dyspnea or shortness of  breath Dermatological: negative for rash Respiratory: negative for cough or wheezing Urologic: negative for hematuria Abdominal: negative for nausea, vomiting, diarrhea, bright red blood per rectum, melena, or hematemesis Neurologic: negative for visual changes, syncope, or dizziness All other systems reviewed and are otherwise negative except as noted above.    Blood pressure 124/75, pulse 87, height 5\' 9"  (1.753 m), weight 241 lb 12.8 oz (109.7 kg).  General appearance: alert and no distress Neck: no adenopathy, no carotid bruit, no JVD, supple, symmetrical, trachea midline and thyroid not enlarged, symmetric, no tenderness/mass/nodules Lungs: clear to  auscultation bilaterally Heart: regular rate and rhythm, S1, S2 normal, no murmur, click, rub or gallop Extremities: extremities normal, atraumatic, no cyanosis or edema and 2+ pedal pulses bilaterally  EKG Sinus rhythm at 87 with right bundle branch block. I personally reviewed this EKG  ASSESSMENT AND PLAN:   Peripheral arterial disease [facility]) Mr. Chase Harris was referred to me by Dr. Jacqualyn Harris for evaluation of PAD. He has a history of hypertension and diabetes. He has chronic pain in both lower extremities there regardless of activity when he goes to sleep and wakes up, stands up and ambulates. He has seen Dr. Jacqualyn Harris for a painful left foot. Dopplers performed at [facility] were essentially normal with medial calcification and triphasic waveforms. He did have a moderately elevated velocity in his right popliteal artery but really denies claudication. At this point, I see no indication for intervention and recommend conservative therapy.      Chase Harp MD FACP,FACC,FAHA, [facility] 08/23/2016 9:49 AM

## 2016-09-01 ENCOUNTER — Encounter: Payer: Self-pay | Admitting: Podiatry

## 2016-09-01 ENCOUNTER — Ambulatory Visit (INDEPENDENT_AMBULATORY_CARE_PROVIDER_SITE_OTHER): Payer: BLUE CROSS/BLUE SHIELD | Admitting: Podiatry

## 2016-09-01 DIAGNOSIS — Q828 Other specified congenital malformations of skin: Secondary | ICD-10-CM | POA: Diagnosis not present

## 2016-09-01 DIAGNOSIS — M216X2 Other acquired deformities of left foot: Secondary | ICD-10-CM | POA: Diagnosis not present

## 2016-09-01 DIAGNOSIS — M21629 Bunionette of unspecified foot: Secondary | ICD-10-CM | POA: Diagnosis not present

## 2016-09-01 DIAGNOSIS — M722 Plantar fascial fibromatosis: Secondary | ICD-10-CM

## 2016-09-06 NOTE — Progress Notes (Signed)
Subjective: 55 year old male presents the office today for follow-up evaluation of left foot pain. He states he continues to get pain to the left foot submetatarsal 5 where he points. Areas painful with weightbearing and pressure. He continues to get small callus formation over the area.no recent injury or trauma. He occasionally gets some discomfort in the arch of the foot as well. This is more intermittent in nature.He did recently follow-up with Dr. Alvester Chou as well. Denies any systemic complaints such as fevers, chills, nausea, vomiting. No acute changes since last appointment, and no other complaints at this time.   Objective: AAO x3, NAD DP/PT pulses palpable bilaterally, CRT less than 3 seconds Minimal hyperkeratotic tissue present left foot metatarsal 5. Upon debridement no underlying ulceration, drainage or signs of infection. There is tenderness palpation of this area. Mild tailors bunion is also present. There is prominent submetatarsal 5 and the left foot there is tenderness directly along the metatarsal head plantarly to this area. This is where the majority of symptoms are localized. There are no other areas of tenderness no other areas of pinpoint bony tenderness or pain the vibratory sensation. There is prominent metatarsal heads plantarly and atrophy of fat pad. There is currently no tenderness to palpatiohe medial and the plantar fascial in the arch the foot however subjectively he does get discomfort in this area at times. No edema, erythema, increase in warmth to bilateral lower extremities.  No open lesions or pre-ulcerative lesions.  No pain with calf compression, swelling, warmth, erythema  Assessment: Submetatarsal 5 pain left foot; plantar fasciitis  Plan: -All treatment options discussed with the patient including all alternatives, risks, complications.  -Hyperkeratotic lesion was debrided without complications or bleeding.  -At this time given the pain as well as the  prominence I recommended a custom insert with the fifth metatarsal head offloading.he wishes to proceed with this today. He was scanned for orthotics and they were sent to [facility] labs. -Follow-up in 3 weeks to pick up inserts or sooner if needed.  Celesta Gentile, DPM

## 2016-09-09 DIAGNOSIS — Z7189 Other specified counseling: Secondary | ICD-10-CM | POA: Insufficient documentation

## 2016-09-09 DIAGNOSIS — Z7185 Encounter for immunization safety counseling: Secondary | ICD-10-CM | POA: Insufficient documentation

## 2016-09-22 ENCOUNTER — Encounter: Payer: Self-pay | Admitting: Podiatry

## 2016-09-22 ENCOUNTER — Ambulatory Visit (INDEPENDENT_AMBULATORY_CARE_PROVIDER_SITE_OTHER): Payer: BLUE CROSS/BLUE SHIELD | Admitting: Podiatry

## 2016-09-22 DIAGNOSIS — M216X2 Other acquired deformities of left foot: Secondary | ICD-10-CM

## 2016-09-22 DIAGNOSIS — M21629 Bunionette of unspecified foot: Secondary | ICD-10-CM

## 2016-09-22 DIAGNOSIS — M7662 Achilles tendinitis, left leg: Secondary | ICD-10-CM

## 2016-09-22 NOTE — Progress Notes (Signed)
Subjective: 55 year old male presents the office if the cup orthotics for pain to the second metatarsal 5 year and the left foot. However over the last week he started to have pain in the back of his left heel. Denies any recent injury or trauma. The pain is worse in the morning he first gets up and after activity the pain is improved however it does rub in shoes causing irritation of the back of the heel. No numbness or tingling to this area. The pain does not wake him up at night. Denies any systemic complaints such as fevers, chills, nausea, vomiting. No acute changes since last appointment, and no other complaints at this time.   Objective: AAO x3, NAD DP/PT pulses palpable bilaterally, CRT less than 3 seconds There is tenderness posterior aspect the left calcaneus insertion of the Achilles tendon. There is no pain along the substance of the Achilles tendon and Thompson test is negative. There is no overlying edema, erythema, increase in warmth. There is no pain with lateral compression of the calcaneus. Minimal discomfort to metatarsal 5 of the left foot. No other areas of tenderness identified today. No other open lesions or pre-ulcerative lesions. No pain with calf compression, swelling, warmth, erythema.  Assessment: Patient was in state pick up orthotics and a new complaint of posterior heel pain/insertional Achilles tendinitis  Plan: -Treatment options discussed including all alternatives, risks, and complications -Etiology of symptoms were discussed -Night splint was dispensed. Stretching exercises. Order compound cream for anti-inflammatory. Ice the area and limit activity. -Orthotics were dispensed today. Oral and written break in instructions were discussed. -Follow up in 4 weeks or sooner if needed. Call any questions concerns meantime.  Celesta Gentile, DPM

## 2016-09-22 NOTE — Patient Instructions (Signed)

## 2016-10-20 ENCOUNTER — Ambulatory Visit: Payer: BLUE CROSS/BLUE SHIELD | Admitting: Podiatry

## 2016-11-03 ENCOUNTER — Encounter: Payer: Self-pay | Admitting: Podiatry

## 2016-11-03 ENCOUNTER — Ambulatory Visit (INDEPENDENT_AMBULATORY_CARE_PROVIDER_SITE_OTHER): Payer: BLUE CROSS/BLUE SHIELD | Admitting: Podiatry

## 2016-11-03 DIAGNOSIS — L84 Corns and callosities: Secondary | ICD-10-CM

## 2016-11-03 DIAGNOSIS — M21629 Bunionette of unspecified foot: Secondary | ICD-10-CM

## 2016-11-04 ENCOUNTER — Telehealth: Payer: Self-pay | Admitting: *Deleted

## 2016-11-04 NOTE — Telephone Encounter (Signed)
"  I saw Dr. Jacqualyn Posey yesterday and he wanted me to call you to set up my surgery."  What type of surgery are you having?  "It's a Oncologist."  Do you have a date in mind that you would like to do it?  He does surgery on Tuesday.  "Yes, I'd like to do it on Wednesday, March 7."  I will get it scheduled.  You can register with the surgical center, instructions are in the pamphlet in blue bag.  Someone from the surgical center will call you a day or two prior to surgery date with the arrival time.

## 2016-11-08 NOTE — Progress Notes (Signed)
Subjective: 56 year old male presents the office today for follow-up evaluation and contined to the left foot he points to the lateral aspect of the fifth metatarsal where he if majority of pain. He gets some pain in the plantar aspect of the fifth metatarsal he states the majority of pain is the outside. At this time he is inquiring about possible surgical intervention to help decrease his pain. He also is no systemic callus the back of his heel which is been painful which is been ongoing for 1-2 weeks. Denies any recent injury. No drainage or pus.  Objective: AAO x3, NAD DP/PT pulses palpable bilaterally, CRT less than 3 seconds There is mild tenderness posterior aspect of the calcaneous on the area of the hyperkeratotic lesion. Upon debridement there is no underlying ulceration, drainage or any signs of infection. The majority tenderness appears to be localized the left foot on the tailor's bunion on the lateral aspect of the fifth metatarsal head. There is mild irritation from shoe gear. There is no pain with MPJ range of motion. Minimal tenderness palpation along submetatarsal 5. There is no other areas of bony tenderness or pain the vibratory sensation. No other open lesions or pre-ulcerative lesions. No pain with calf compression, swelling, warmth, erythema.  Assessment: Mr. Kirkman presents today for concerns of continuing pain along the tailors bunion to the left foot  Plan: -Treatment options discussed including all alternatives, risks, and complications -Hyperkeratotic lesion was sharply debrided without complications or bleeding. Offloading. -Ice and his orthotics back to help further modify them increase the arch. -At this time I discussed with him both conservative and surgical treatment options. The pain to his left is been ongoing for several months at this time and he said no improvement. He wishes to pursue a surgical intervention. I discussed that this does guarantee resolution  of pain.  Discussed with him the left tailors bunionectomy with screw fixation and he wishes to proceed.  -The incision placement as well as the postoperative course was discussed with the patient. I discussed risks of the surgery which include, but not limited to, infection, bleeding, pain, swelling, need for further surgery, delayed or nonhealing, painful or ugly scar, numbness or sensation changes, over/under correction, recurrence, transfer lesions, further deformity, hardware failure, DVT/PE, loss of toe/foot. Patient understands these risks and wishes to proceed with surgery. The surgical consent was reviewed with the patient all 3 pages were signed. No promises or guarantees were given to the outcome of the procedure. All questions were answered to the best of my ability. Before the surgery the patient was encouraged to call the office if there is any further questions. The surgery will be performed at the [facility] on an outpatient basis.  Celesta Gentile, DPM

## 2016-11-09 ENCOUNTER — Telehealth: Payer: Self-pay | Admitting: *Deleted

## 2016-11-09 NOTE — Telephone Encounter (Signed)
-----   Message from Trula Slade, DPM sent at 11/08/2016  7:26 AM EST ----- I have sent letters to his rheumatologist and cardiologist asking for clearance. Can you see if he has had a recent A1c? His last was from several years ago and was pretty elevated. If he has not had it recently, can you please order one? Thanks.

## 2016-11-09 NOTE — Telephone Encounter (Signed)
I am calling on behalf of Dr. Jacqualyn Posey.  He wants to know if you have had a recent A1c.  "Yes, I told him I had one about a week ago.  It was 7.1.  He must have forgot I told him."  Can you call your doctor and get him to send [place] a copy of the results.  "Yeah I guess but I can't do it right now.  I'll do it later."

## 2016-11-09 NOTE — Telephone Encounter (Signed)
Yes, I just would like it in writing if possible. Thanks.

## 2016-11-16 ENCOUNTER — Encounter: Payer: Self-pay | Admitting: Podiatry

## 2016-11-18 NOTE — Telephone Encounter (Signed)
Per Dawn, patient called back and stated he had taken care of this already.  He said he took it to the [place] office.  He also stated his doctor's office had sent it as well.

## 2016-11-18 NOTE — Telephone Encounter (Signed)
I left patient a reminder that we need a copy of his most recent A1c.

## 2016-11-30 DIAGNOSIS — M2012 Hallux valgus (acquired), left foot: Secondary | ICD-10-CM | POA: Diagnosis not present

## 2016-12-01 ENCOUNTER — Telehealth: Payer: Self-pay | Admitting: *Deleted

## 2016-12-01 NOTE — Telephone Encounter (Addendum)
Pt states he has sharp and throbbing pain and percocet isn't helping. I spoke with pt and instructed to remove cam boot, open-ended sock, ace wrap only and elevate foot for 15 minutes, but if pain worsened to dangle foot 15 minutes, this being the only time it was advised to dangle the foot. After 15 minutes, place foot level with hip and rewrap the ace looser starting at the toes going up, replace sock and reapply the cam boot. I told pt if he tolerated ibuprofen OTC, he could take as the package instructed in between doses of the Percocet. I encouraged pt to call with concerns. Pt states understanding.12/07/2016-pt states Dr. Jacqualyn Posey said if he needed pain medication percocet he could pick it up in the [place] office. Dr. Jacqualyn Posey states refill as previously. I told pt he could pick up the rx in the [place] office today between 4:30pm and 5:00pm or tomorrow 11:30am and 12:00pm. Pt states he will pick up today.

## 2016-12-02 ENCOUNTER — Telehealth: Payer: Self-pay

## 2016-12-02 MED ORDER — OXYCODONE-ACETAMINOPHEN 5-325 MG PO TABS: 1.0000 | ORAL_TABLET | ORAL | 0 refills | Status: DC | PRN

## 2016-12-02 NOTE — Telephone Encounter (Signed)
Patient called requesting a refill of Percocet from Dr. Jacqualyn Posey.  Per Marcy Siren, ok to give Percocet 5/325 1 to 2 q4-6 hours prn #[id number]  Per Dr. Jacqualyn Posey.

## 2016-12-02 NOTE — Telephone Encounter (Signed)
Rx has been printed and signed, patient contacted to come to [place] office to pick up rx

## 2016-12-05 ENCOUNTER — Ambulatory Visit (INDEPENDENT_AMBULATORY_CARE_PROVIDER_SITE_OTHER): Payer: Self-pay | Admitting: Podiatry

## 2016-12-05 ENCOUNTER — Ambulatory Visit (INDEPENDENT_AMBULATORY_CARE_PROVIDER_SITE_OTHER): Payer: BLUE CROSS/BLUE SHIELD

## 2016-12-05 DIAGNOSIS — Z9889 Other specified postprocedural states: Secondary | ICD-10-CM

## 2016-12-05 DIAGNOSIS — M21629 Bunionette of unspecified foot: Secondary | ICD-10-CM

## 2016-12-05 MED ORDER — CLINDAMYCIN HCL 300 MG PO CAPS: 300.0000 mg | ORAL_CAPSULE | Freq: Three times a day (TID) | ORAL | 2 refills | Status: DC

## 2016-12-05 NOTE — Progress Notes (Signed)
Subjective: Chase Harris is a 56 y.o. is seen today in office s/p left tailors bunionectomy preformed on 11/30/16. He state his pain is improving. He has remained in the CAM boot at all times and he does use crutches for balance to avoid putting all of his weight on his surgical foot. He has been taking antibiotics, clindamycin. Denies any systemic complaints such as fevers, chills, nausea, vomiting. No calf pain, chest pain, shortness of breath.   Objective: General: No acute distress, AAOx3  DP/PT pulses palpable 2/4, CRT < 3 sec to all digits.  Protective sensation intact. Motor function intact.  Left foot: Incision is well coapted without any evidence of dehiscence and sutures are intact. There is no ascending cellulitis, fluctuance, crepitus, malodor, drainage/purulence. There is mild edema around the surgical site. There is moderate pain along the surgical site. There does appear to be some mild erythema to the lateral foot but there is no warmth associated with it and there is no ascending cellulitis. This is likely more from inflammation as opposed to infection.  No other areas of tenderness to bilateral lower extremities.  No other open lesions or pre-ulcerative lesions.  No pain with calf compression, swelling, warmth, erythema.   Assessment and Plan:  Status post left tailors bunionectomy, doing well with no complications   -Treatment options discussed including all alternatives, risks, and complications -X-rays were obtained and reviewed with the patient. S/p tailors bunionectomy. Hardware intact.  -Continue clindamycin. Will continue for 1 more week to prevent infection.  -Continue with surgical boot. Wear at all times. Crutches as needed but can be WBAT.  -Ice/elevation -Pain medication as needed. -Monitor for any clinical signs or symptoms of infection and DVT/PE and directed to call the office immediately should any occur or go to the ER. -Follow-up in 1 week for suture  removal or sooner if any problems arise. In the meantime, encouraged to call the office with any questions, concerns, change in symptoms.   Celesta Gentile, DPM

## 2016-12-07 MED ORDER — OXYCODONE-ACETAMINOPHEN 5-325 MG PO TABS: 1.0000 | ORAL_TABLET | Freq: Three times a day (TID) | ORAL | 0 refills | Status: DC | PRN

## 2016-12-07 NOTE — Telephone Encounter (Signed)
Rx printed and given to the patient at 5:01pm today

## 2016-12-12 ENCOUNTER — Ambulatory Visit (INDEPENDENT_AMBULATORY_CARE_PROVIDER_SITE_OTHER): Payer: Self-pay | Admitting: Podiatry

## 2016-12-12 DIAGNOSIS — M21629 Bunionette of unspecified foot: Secondary | ICD-10-CM

## 2016-12-12 DIAGNOSIS — Z9889 Other specified postprocedural states: Secondary | ICD-10-CM

## 2016-12-12 MED ORDER — OXYCODONE-ACETAMINOPHEN 5-325 MG PO TABS: 1.0000 | ORAL_TABLET | Freq: Three times a day (TID) | ORAL | 0 refills | Status: DC | PRN

## 2016-12-18 NOTE — Progress Notes (Signed)
Subjective: Chase Harris is a 56 y.o. is seen today in office s/p left tailors bunionectomy preformed on 11/30/16. He state his pain is improving. He has tried to go for a day without pain medication but he did have to go back to taking it. He has remained in the CAM boot. He has been taking antibiotics, clindamycin. Denies any systemic complaints such as fevers, chills, nausea, vomiting. No calf pain, chest pain, shortness of breath.   Objective: General: No acute distress, AAOx3  DP/PT pulses palpable 2/4, CRT < 3 sec to all digits.  Protective sensation intact. Motor function intact.  Left foot: Incision is well coapted without any evidence of dehiscence and sutures are intact. There is no ascending cellulitis, fluctuance, crepitus, malodor, drainage/purulence. There is decreased edema around the surgical site. There is mild to moderate pain along the surgical site however this is improving. The area of the erythema has greatly improved. This was likely more from inflammation as opposed to infection. No increase in warmth. No ascending cellulitis.  No other areas of tenderness to bilateral lower extremities.  No other open lesions or pre-ulcerative lesions.  No pain with calf compression, swelling, warmth, erythema.   Assessment and Plan:  Status post left tailors bunionectomy, improving.   -Treatment options discussed including all alternatives, risks, and complications -I left the sutures intact today. The incision is healing well but not yet ready to come out.  Elizebeth Koller course of clindamycin.  --Continue with surgical boot. Wear at all times. Crutches as needed but can be WBAT.  -Ice/elevation -Pain medication as needed. -Monitor for any clinical signs or symptoms of infection and DVT/PE and directed to call the office immediately should any occur or go to the ER. -Follow-up in 1 week for suture removal or sooner if any problems arise. In the meantime, encouraged to call the office  with any questions, concerns, change in symptoms.   Celesta Gentile, DPM

## 2016-12-23 ENCOUNTER — Encounter: Payer: Self-pay | Admitting: Podiatry

## 2016-12-23 ENCOUNTER — Ambulatory Visit (INDEPENDENT_AMBULATORY_CARE_PROVIDER_SITE_OTHER): Payer: BLUE CROSS/BLUE SHIELD | Admitting: Podiatry

## 2016-12-23 DIAGNOSIS — M21629 Bunionette of unspecified foot: Secondary | ICD-10-CM | POA: Diagnosis not present

## 2016-12-23 DIAGNOSIS — Z9889 Other specified postprocedural states: Secondary | ICD-10-CM

## 2016-12-23 MED ORDER — OXYCODONE-ACETAMINOPHEN 5-325 MG PO TABS: 1.0000 | ORAL_TABLET | Freq: Three times a day (TID) | ORAL | 0 refills | Status: DC | PRN

## 2016-12-23 NOTE — Progress Notes (Signed)
Subjective: Chase Harris is a 56 y.o. is seen today in office s/p left tailors bunionectomy preformed on 11/30/16. He state his pain is improving. He is a fever refill pain medicine today. He also presents today to have his sutures removed. He remained in the cam boot. He is been elevating the has not been putting ice on the foot.   Denies any systemic complaints such as fevers, chills, nausea, vomiting. No calf pain, chest pain, shortness of breath.   Objective: General: No acute distress, AAOx3  DP/PT pulses palpable 2/4, CRT < 3 sec to all digits.  Protective sensation intact. Motor function intact.  Left foot: Incision is well coapted without any evidence of dehiscence and sutures are intact. There is no ascending cellulitis, fluctuance, crepitus, malodor, drainage/purulence. There is mild and improved edema around the surgical site. There is mild pain along the surgical site however this is improving he states as well. He states the area just feels "sensitive". There is no erythema. There is no increase in warmth of the foot.  No ascending cellulitis.  No other areas of tenderness to bilateral lower extremities.  No other open lesions or pre-ulcerative lesions.  No pain with calf compression, swelling, warmth, erythema.   Assessment and Plan:  Status post left tailors bunionectomy, improving.   -Treatment options discussed including all alternatives, risks, and complications -Sutures removed today without complications. Interbody on it was applied followed by a bandage. He can start to shower tomorrow and apply Neosporin and a bandage. -He can with a surgical shoe. -Pain medication as needed. Refilled this today. -Weightbearing as tolerated. -Ice and elevation. -Compression anklet dispensed today. -Monitor for any clinical signs or symptoms of infection and DVT/PE and directed to call the office immediately should any occur or go to the ER. -Follow-up in 2 weeks for x-rays or sooner  if any problems arise. In the meantime, encouraged to call the office with any questions, concerns, change in symptoms.   Celesta Gentile, DPM

## 2017-01-06 ENCOUNTER — Ambulatory Visit (INDEPENDENT_AMBULATORY_CARE_PROVIDER_SITE_OTHER): Payer: Self-pay | Admitting: Podiatry

## 2017-01-06 ENCOUNTER — Ambulatory Visit (INDEPENDENT_AMBULATORY_CARE_PROVIDER_SITE_OTHER): Payer: BLUE CROSS/BLUE SHIELD

## 2017-01-06 ENCOUNTER — Encounter: Payer: Self-pay | Admitting: Podiatry

## 2017-01-06 VITALS — Temp 97.5°F

## 2017-01-06 DIAGNOSIS — L03116 Cellulitis of left lower limb: Secondary | ICD-10-CM

## 2017-01-06 DIAGNOSIS — M21629 Bunionette of unspecified foot: Secondary | ICD-10-CM

## 2017-01-06 MED ORDER — OXYCODONE-ACETAMINOPHEN 5-325 MG PO TABS: 1.0000 | ORAL_TABLET | Freq: Three times a day (TID) | ORAL | 0 refills | Status: DC | PRN

## 2017-01-06 MED ORDER — CLINDAMYCIN HCL 300 MG PO CAPS: 300.0000 mg | ORAL_CAPSULE | Freq: Three times a day (TID) | ORAL | 2 refills | Status: DC

## 2017-01-09 NOTE — Progress Notes (Signed)
Subjective: Chase Harris is a 56 y.o. is seen today in office s/p left tailors bunionectomy preformed on 11/30/16. He states it is starting this past Monday started to get more sensitive and his wife is noticed this gotten somewhat more red around the incision. He has been taking Percocet for pain. Denies any drainage or pus and denies any red streaks. His remain in surgical shoe.  Denies any systemic complaints such as fevers, chills, nausea, vomiting. No calf pain, chest pain, shortness of breath.   Objective: General: No acute distress, AAOx3  DP/PT pulses palpable 2/4, CRT < 3 sec to all digits.  Protective sensation intact. Motor function intact.  Left foot: Incision appears to be coapted how there does appear to be small superficial area within the incision of fibrotic tissue. There does appear to be some faint erythema directly around the incision but there is no ascending cellulitis. There is mild edema to the area with is no fluctuation, crepitus or any malodor. No drainage or pus is expressed today.  No other areas of tenderness to bilateral lower extremities.  No other open lesions or pre-ulcerative lesions.  No pain with calf compression, swelling, warmth, erythema.   Assessment and Plan:  Status post left tailors bunionectomy,with localized infection..   -Treatment options discussed including all alternatives, risks, and complications -X-rays were obtained and reviewed with the patient. Hardware intact. No evidence of loosening. No evidence of acute fracture and foot otherwise. -Recommended Betadine wet-to-dry dressing changes. -Remaining surgical shoe/cam boot at all times. -We'll start clindamycin given erythema. -Pain medication as needed. Refilled this today. -Weightbearing as tolerated. -Ice and elevation. -Monitor for any clinical signs or symptoms of infection and DVT/PE and directed to call the office immediately should any occur or go to the ER. -Follow-up in 1  weeks or sooner if any problems arise. In the meantime, encouraged to call the office with any questions, concerns, change in symptoms.   Celesta Gentile, DPM

## 2017-01-13 ENCOUNTER — Encounter: Payer: Self-pay | Admitting: Podiatry

## 2017-01-13 ENCOUNTER — Ambulatory Visit (INDEPENDENT_AMBULATORY_CARE_PROVIDER_SITE_OTHER): Payer: BLUE CROSS/BLUE SHIELD | Admitting: Podiatry

## 2017-01-13 ENCOUNTER — Telehealth: Payer: Self-pay | Admitting: *Deleted

## 2017-01-13 VITALS — Temp 97.2°F

## 2017-01-13 DIAGNOSIS — M21629 Bunionette of unspecified foot: Secondary | ICD-10-CM

## 2017-01-13 DIAGNOSIS — T8130XA Disruption of wound, unspecified, initial encounter: Secondary | ICD-10-CM

## 2017-01-13 MED ORDER — OXYCODONE-ACETAMINOPHEN 5-325 MG PO TABS: 1.0000 | ORAL_TABLET | Freq: Three times a day (TID) | ORAL | 0 refills | Status: DC | PRN

## 2017-01-13 MED ORDER — COLLAGENASE 250 UNIT/GM EX OINT: 1.0000 "application " | TOPICAL_OINTMENT | Freq: Every day | CUTANEOUS | 0 refills | Status: DC

## 2017-01-13 NOTE — Telephone Encounter (Addendum)
Faxed required form, demographics and clinicals to [facility] for Santyl order. [facility] called for the depth wound. Faxed forms again with 0.1cm depth to [facility]. 01/18/2017-[facility] states the [facility] arrived at pt's pharmacy this morning.02/17/2017-[facility] states pt says he is allergic to Doxycycline. Dr. Jacqualyn Posey ordered Clindamycin 300mg  #21 one capsule tid. Orders escribed to [place]. Pt called to see if it would be okay for him to be in recreational water this weekend. I left message informing pt that if he had any wound on his foot he should not swim or be in any water, lake, ocean or pool.

## 2017-01-16 NOTE — Progress Notes (Signed)
Subjective: Chase Harris is a 56 y.o. is seen today in office s/p left tailors bunionectomy preformed on 11/30/16. He states that his pain is improved some but is still symptomatic is occasional sharp pains on surgical site. He has remained in the surgical shoe. He is been using Betadine dressing changes of the wound daily. He states the redness has improved denies any red streaks. Denies any drainage or pus. He has continue with antibiotics. Denies any systemic complaints such as fevers, chills, nausea, vomiting. No calf pain, chest pain, shortness of breath.   Objective: General: No acute distress, AAOx3  DP/PT pulses palpable 2/4, CRT < 3 sec to all digits.  Protective sensation intact. Motor function intact.  Left foot: Small superficial wound dehiscence encompassing the underlying skin. Area of fibrotic skin present within the wound base. The periphery the wound appears granular with the wound is majority fibrotic. There is localized edema and faint erythema but this appears be much improved compared to what it was last appointment. There is no drainage or pus expressed today there is no malodor. No other areas of tenderness to bilateral lower extremities.  No other open lesions or pre-ulcerative lesions.  No pain with calf compression, swelling, warmth, erythema.   Assessment and Plan:  Status post left tailors bunionectomy, with superficial wound dehiscence  -Treatment options discussed including all alternatives, risks, and complications -Recommended Santyl dressing changes daily. This was ordered for him today. Now continue with Betadine until he gets the Santyl. -Remaining surgical shoe. -Continue antibiotics. -Monitor for any clinical signs or symptoms of infection and directed to call the office immediately should any occur or go to the ER. -Follow-up 3 weeks or sooner if any problems arise. In the meantime, encouraged to call the office with any questions, concerns, change in  symptoms.   Celesta Gentile, DPM

## 2017-01-23 ENCOUNTER — Ambulatory Visit (INDEPENDENT_AMBULATORY_CARE_PROVIDER_SITE_OTHER): Payer: Self-pay | Admitting: Podiatry

## 2017-01-23 ENCOUNTER — Encounter: Payer: Self-pay | Admitting: Podiatry

## 2017-01-23 VITALS — BP 126/79 | HR 90

## 2017-01-23 DIAGNOSIS — T8130XA Disruption of wound, unspecified, initial encounter: Secondary | ICD-10-CM

## 2017-01-23 DIAGNOSIS — M21629 Bunionette of unspecified foot: Secondary | ICD-10-CM

## 2017-01-23 MED ORDER — OXYCODONE-ACETAMINOPHEN 5-325 MG PO TABS: 1.0000 | ORAL_TABLET | Freq: Three times a day (TID) | ORAL | 0 refills | Status: DC | PRN

## 2017-01-23 NOTE — Progress Notes (Signed)
Subjective: Chase Harris is a 56 y.o. is seen today in office s/p left tailors bunionectomy preformed on 11/30/16. He presents today for follow-up evaluation of wound to the left foot due to wound dehiscence. He has been using santyl daily and he has continued on antibiotics. He will finish antibiotics this Thursday. Denies any systemic complaints such as fevers, chills, nausea, vomiting. No calf pain, chest pain, shortness of breath.   Objective: General: No acute distress, AAOx3  DP/PT pulses palpable 2/4, CRT < 3 sec to all digits.  Protective sensation intact. Motor function intact.  Left foot: Small superficial wound dehiscence encompassing the underlying skin. There is still some fibrotic tissue over the wound but it is much improved. There is a faint rim of erythema around the incision but this looks more like inflammation as opposed to infection. No drainage or pus. No ascending cellulitis. No malodor. No other areas of tenderness to bilateral lower extremities.  No other open lesions or pre-ulcerative lesions.  No pain with calf compression, swelling, warmth, erythema.   Assessment and Plan:  Status post left tailors bunionectomy, with superficial wound dehiscence  -Treatment options discussed including all alternatives, risks, and complications -the wound is improving. I sharply debrided the fibrotic tissue today. Use steri-strips followed by santyl daily.  -Remaining surgical shoe. -Finish course of antibiotics. If there is any increase in redness, swelling or any drainage will restart but we will observe off of antibiotics.  -Monitor for any clinical signs or symptoms of infection and directed to call the office immediately should any occur or go to the ER. -Follow-up  or sooner if any problems arise. In the meantime, encouraged to call the office with any questions, concerns, change in symptoms.   Celesta Gentile, DPM

## 2017-01-23 NOTE — Patient Instructions (Signed)
Continue santyl ointment daily

## 2017-01-30 ENCOUNTER — Ambulatory Visit: Payer: BLUE CROSS/BLUE SHIELD | Admitting: Podiatry

## 2017-02-02 ENCOUNTER — Ambulatory Visit (INDEPENDENT_AMBULATORY_CARE_PROVIDER_SITE_OTHER): Payer: Self-pay | Admitting: Podiatry

## 2017-02-02 ENCOUNTER — Encounter: Payer: Self-pay | Admitting: Podiatry

## 2017-02-02 DIAGNOSIS — M21629 Bunionette of unspecified foot: Secondary | ICD-10-CM

## 2017-02-02 DIAGNOSIS — T8130XA Disruption of wound, unspecified, initial encounter: Secondary | ICD-10-CM

## 2017-02-09 NOTE — Progress Notes (Signed)
Subjective: Chase Harris is a 56 y.o. is seen today in office s/p left tailors bunionectomy preformed on 11/30/16. He states the areas painful. His continue with daily dressing changes with Santyl to the wound. He states the wound is getting better denies any drainage or pus. He states that she is slow healing. He still taking pain medication however he does not need a refill today. Denies any systemic complaints such as fevers, chills, nausea, vomiting. No calf pain, chest pain, shortness of breath.   Objective: General: No acute distress, AAOx3  DP/PT pulses palpable 2/4, CRT < 3 sec to all digits.  Protective sensation intact. Motor function intact.  Left foot: Small superficial wound dehiscence encompassing the underlying skin. There is fibrotic tissue along the wound. There is very minimal faint rim of erythema around the incision but there is no ascending cellulitis. There is no warmth. Mild edema to the area but this appears to be somewhat improved. There is no fluctuance or crepitus. There is no malodor. Mild tenderness to palpation of the surgical site today. This has seen about the same. No other areas of tenderness to bilateral lower extremities.  No other open lesions or pre-ulcerative lesions.  No pain with calf compression, swelling, warmth, erythema.   Assessment and Plan:  Status post left tailors bunionectomy, with superficial wound dehiscence  -Treatment options discussed including all alternatives, risks, and complications -The wound is improving, although slowly. I sharply debrided the fibrotic tissue today. Use steri-strips followed by santyl daily.  -Remain in surgical shoe. -Monitor for any clinical signs or symptoms of infection and directed to call the office immediately should any occur or go to the ER. -Follow-up  or sooner if any problems arise. In the meantime, encouraged to call the office with any questions, concerns, change in symptoms.   Celesta Gentile,  DPM

## 2017-02-10 ENCOUNTER — Ambulatory Visit (INDEPENDENT_AMBULATORY_CARE_PROVIDER_SITE_OTHER): Payer: Self-pay | Admitting: Podiatry

## 2017-02-10 ENCOUNTER — Encounter: Payer: Self-pay | Admitting: Podiatry

## 2017-02-10 ENCOUNTER — Ambulatory Visit: Payer: BLUE CROSS/BLUE SHIELD

## 2017-02-10 DIAGNOSIS — M21629 Bunionette of unspecified foot: Secondary | ICD-10-CM

## 2017-02-10 DIAGNOSIS — T8130XA Disruption of wound, unspecified, initial encounter: Secondary | ICD-10-CM

## 2017-02-10 MED ORDER — OXYCODONE-ACETAMINOPHEN 5-325 MG PO TABS: 1.0000 | ORAL_TABLET | Freq: Three times a day (TID) | ORAL | 0 refills | Status: DC | PRN

## 2017-02-10 NOTE — Progress Notes (Signed)
Subjective: Chase Harris is a 56 y.o. is seen today in office s/p left tailors bunionectomy preformed on 11/30/16. He states that he is doing somewhat better and he continue the Santyl dressing changes daily. He states the areas still tenderness asking for refill of pain medication today. He denies any drainage or pus coming from the wound denies any red streaking. Denies any pus coming from the wound. He is continued surgical shoe. Denies any systemic complaints such as fevers, chills, nausea, vomiting. No calf pain, chest pain, shortness of breath.   Objective: General: No acute distress, AAOx3  DP/PT pulses palpable 2/4, CRT < 3 sec to all digits.  Protective sensation intact. Motor function intact.  Left foot: Small superficial wound dehiscence encompassing the underlying skin. There is fibrotic tissue along the wound. The proximal portion incision appears to be healed. The wound measures 2.4 x 0.4 cm in a superficial.  There is very minimal faint rim of erythema around the incision but there is no ascending cellulitis. There is no warmth. Mild edema to the area but this appears to be somewhat improved. There is no fluctuance or crepitus. There is no malodor. Mild tenderness to palpation of the surgical site today. This has seen about the same. No other areas of tenderness to bilateral lower extremities.  No other open lesions or pre-ulcerative lesions.  No pain with calf compression, swelling, warmth, erythema.   Assessment and Plan:  Status post left tailors bunionectomy, with superficial wound dehiscence  -Treatment options discussed including all alternatives, risks, and complications -Today I anesthetized the area with a mixture of lidocaine and Marcaine plain. Once anesthetized I did Shelby debridement the wound to healthy, granular wound base. Santyl was applied followed by a bandage. Continue daily dressing changes with Santyl. -Refill pain medication today. -Remain in surgical  shoe. -Monitor for any clinical signs or symptoms of infection and directed to call the office immediately should any occur or go to the ER. -Follow-up  or sooner if any problems arise. In the meantime, encouraged to call the office with any questions, concerns, change in symptoms.   Celesta Gentile, DPM

## 2017-02-13 DIAGNOSIS — E1142 Type 2 diabetes mellitus with diabetic polyneuropathy: Secondary | ICD-10-CM | POA: Insufficient documentation

## 2017-02-17 ENCOUNTER — Encounter: Payer: Self-pay | Admitting: Podiatry

## 2017-02-17 ENCOUNTER — Ambulatory Visit (INDEPENDENT_AMBULATORY_CARE_PROVIDER_SITE_OTHER): Payer: BLUE CROSS/BLUE SHIELD | Admitting: Podiatry

## 2017-02-17 DIAGNOSIS — T8130XA Disruption of wound, unspecified, initial encounter: Secondary | ICD-10-CM

## 2017-02-17 DIAGNOSIS — L97521 Non-pressure chronic ulcer of other part of left foot limited to breakdown of skin: Secondary | ICD-10-CM

## 2017-02-17 MED ORDER — CLINDAMYCIN HCL 300 MG PO CAPS: 300.0000 mg | ORAL_CAPSULE | Freq: Three times a day (TID) | ORAL | 0 refills | Status: DC

## 2017-02-17 MED ORDER — DOXYCYCLINE HYCLATE 100 MG PO TABS: 100.0000 mg | ORAL_TABLET | Freq: Two times a day (BID) | ORAL | 0 refills | Status: DC

## 2017-02-17 NOTE — Progress Notes (Signed)
Subjective: Chase Harris is a 56 y.o. is seen today in office s/p left tailors bunionectomy preformed on 11/30/16. He states that he is doing better and the pain is improving. He has decreased the amount of pain medication he is taking. He feels the wound has improved. He continues with santyl dressing changes daily. He has continued surgical shoe. Denies any systemic complaints such as fevers, chills, nausea, vomiting. No calf pain, chest pain, shortness of breath.   Objective: General: No acute distress, AAOx3  DP/PT pulses palpable 2/4, CRT < 3 sec to all digits.  Protective sensation intact. Motor function intact.  Left foot: Small superficial wound dehiscence encompassing the underlying skin. There is fibrotic tissue along the wound. The proximal portion incision appears to be healed. The wound measures 2 x 0.3 cm and is superficial.  There is very minimal faint rim of erythema around the incision but there is no ascending cellulitis. There is no warmth. Mild edema to the area but this appears to continue to be some mild improvement. There is no fluctuance or crepitus. There is no malodor. Mild tenderness to palpation of the surgical site today. This has seen about the same. No other areas of tenderness to bilateral lower extremities.  No other open lesions or pre-ulcerative lesions.  No pain with calf compression, swelling, warmth, erythema.   Assessment and Plan:  Status post left tailors bunionectomy, with superficial wound dehiscence  -Treatment options discussed including all alternatives, risks, and complications -Today I anesthetized the area with a mixture of lidocaine and Marcaine plain. Once anesthetized I did sharply debrided the wound to healthy, granular wound base. Santyl was applied followed by a bandage. Continue daily dressing changes with Santyl. -Pain medication prn -Remain in surgical shoe. -Doxycycline due to mild erythema and the wound has been open for some time.  This is more of a precatuation.  -Monitor for any clinical signs or symptoms of infection and directed to call the office immediately should any occur or go to the ER. -Follow-up  In 1 week or sooner if any problems arise. In the meantime, encouraged to call the office with any questions, concerns, change in symptoms.   Celesta Gentile, DPM

## 2017-02-24 ENCOUNTER — Encounter: Payer: Self-pay | Admitting: Podiatry

## 2017-02-24 ENCOUNTER — Ambulatory Visit (INDEPENDENT_AMBULATORY_CARE_PROVIDER_SITE_OTHER): Payer: BLUE CROSS/BLUE SHIELD | Admitting: Podiatry

## 2017-02-24 DIAGNOSIS — L97521 Non-pressure chronic ulcer of other part of left foot limited to breakdown of skin: Secondary | ICD-10-CM

## 2017-02-24 DIAGNOSIS — T8130XA Disruption of wound, unspecified, initial encounter: Secondary | ICD-10-CM

## 2017-02-24 NOTE — Progress Notes (Signed)
Subjective: Chase Harris is a 56 y.o. is seen today in office s/p left tailors bunionectomy preformed on 11/30/16. He states the wound is healing he is having less pain when he sits pain. Majority pain decreases to the bottom of the foot and he points to submetatarsal 5. He has decrease amount of pain medicine he has been taking. He is continue with surgical shoe. He did go to the beach over the weekend or surgical shoe. Denies any systemic complaints such as fevers, chills, nausea, vomiting. No calf pain, chest pain, shortness of breath.   Objective: General: No acute distress, AAOx3  DP/PT pulses palpable 2/4, CRT < 3 sec to all digits.  Protective sensation intact. Motor function intact.  Left foot: Small superficial wound dehiscence encompassing the underlying skin.  The majority incision appears much well healed compared to previous. On the distal portion are 2 separate small areas. The very distal portion incision has a superficial opening approximate 0.3 x 0.2 cm in a superficial. This proximal to this more along the mid substance the incision or just distal to this meds line of the incision is a wound measuring 0.3 x 0.3 x 0.2 cm. Is no probing, undermining or tunneling. Faint erythema this is likely correlate more to the bandage inflammation as opposed to infection. There is no ascending synovitis, warmth. There is mild edema.  Tenderness on the dorsal 5 and mildly overlying the incision.  No other areas of tenderness to bilateral lower extremities.  No other open lesions or pre-ulcerative lesions.  No pain with calf compression, swelling, warmth, erythema.     Assessment and Plan:  Status post left tailors bunionectomy, with superficial wound dehiscence with improvement  -Treatment options discussed including all alternatives, risks, and complications -I sharply debrided the wound today to healthy tissue. Iodosorb dressing changes daily. Hold off on the [place]. Continue daily  dressing changes. -Continue surgical shoe at all times. -Monitor for any clinical signs or symptoms of infection and directed to call the office immediately should any occur or go to the ER. -Finish course of antibiotics -RTC 1 week or sooner if needed.  Celesta Gentile, DPM

## 2017-02-28 ENCOUNTER — Encounter: Payer: Self-pay | Admitting: Podiatry

## 2017-02-28 NOTE — Progress Notes (Signed)
Left foot talus bunionectomy

## 2017-03-03 ENCOUNTER — Ambulatory Visit (INDEPENDENT_AMBULATORY_CARE_PROVIDER_SITE_OTHER): Payer: BLUE CROSS/BLUE SHIELD

## 2017-03-03 ENCOUNTER — Encounter: Payer: Self-pay | Admitting: Podiatry

## 2017-03-03 ENCOUNTER — Ambulatory Visit (INDEPENDENT_AMBULATORY_CARE_PROVIDER_SITE_OTHER): Payer: Self-pay | Admitting: Podiatry

## 2017-03-03 DIAGNOSIS — M21629 Bunionette of unspecified foot: Secondary | ICD-10-CM | POA: Diagnosis not present

## 2017-03-06 NOTE — Progress Notes (Signed)
Subjective: Chase Harris is a 56 y.o. is seen today in office s/p left tailors bunionectomy preformed on 11/30/16. He states that he is doing better. He still gets some pain on the bottom and he points to submetatarsal 5. He has not noticed any drainage or pus coming from the incision incision is doing better. Denies any increase in redness or swelling. Denies any systemic complaints such as fevers, chills, nausea, vomiting. No calf pain, chest pain, shortness of breath.   Objective: General: No acute distress, AAOx3  DP/PT pulses palpable 2/4, CRT < 3 sec to all digits.  Protective sensation intact. Motor function intact.  Left foot: Small superficial wound dehiscence emesis towards the distal portion of the incision. The remainder the incision appears to be coapted this point and the wound overall is much improved. The wound measures 0.2 x 0.2 x 0.1 cm distally. There is a faint from of erythema around the wound but this is more inflammation as opposed to infection. There is no ascending cellulitis. There is no warmth. There is minimal edema. Tenderness submetatarsal 5 area. No other area of tenderness identified at this time. No other areas of tenderness to bilateral lower extremities.  No other open lesions or pre-ulcerative lesions.  No pain with calf compression, swelling, warmth, erythema.   Assessment and Plan:  Status post left tailors bunionectomy, with superficial wound dehiscence with improvement  -Treatment options discussed including all alternatives, risks, and complications -X-rays were taken and reviewed. Hardware intact. Slight prominence the screw plantarly which may be causing patient's symptoms. -Shop and debrided the wound surgically no tissue. Steri-Strip was applied followed by Iodosorb. Continue daily dressing changes. -Continue surgical shoe. Offloading pads were dispensed. -I discussed with him in the future may get have the screw removed. -Monitor for any clinical  signs or symptoms of infection and directed to call the office immediately should any occur or go to the ER. -RTC as scheduled or sooner if needed.  Celesta Gentile, DPM

## 2017-03-13 ENCOUNTER — Ambulatory Visit (INDEPENDENT_AMBULATORY_CARE_PROVIDER_SITE_OTHER): Payer: Self-pay | Admitting: Podiatry

## 2017-03-13 ENCOUNTER — Encounter: Payer: Self-pay | Admitting: Podiatry

## 2017-03-13 DIAGNOSIS — L97521 Non-pressure chronic ulcer of other part of left foot limited to breakdown of skin: Secondary | ICD-10-CM

## 2017-03-13 DIAGNOSIS — M21629 Bunionette of unspecified foot: Secondary | ICD-10-CM

## 2017-03-13 MED ORDER — SULFAMETHOXAZOLE-TRIMETHOPRIM 800-160 MG PO TABS: 1.0000 | ORAL_TABLET | Freq: Two times a day (BID) | ORAL | 0 refills | Status: DC

## 2017-03-13 NOTE — Progress Notes (Signed)
Subjective: Chase Harris is a 56 y.o. is seen today in office s/p left tailors bunionectomy preformed on 11/30/16. He states that the pain does continue but the wound is healing. He has not measuring drains or pus. He has some faint redness from the area but denies any red streaks or any pus. He did try to wear a shoe but the pressure on the incisions that were did not wear to cause more pain. Denies any systemic complaints such as fevers, chills, nausea, vomiting. No calf pain, chest pain, shortness of breath.   Objective: General: No acute distress, AAOx3  DP/PT pulses palpable 2/4, CRT < 3 sec to all digits.  Protective sensation intact. Motor function intact.  Left foot: Hyperkeratotic lesion present to the distal portion of the incision. Upon debridement the wound appears to almost completely healed this point. A well-formed scars present on the proximal aspect of the incision. His family and redness from the area this moment to clear drainage but no pus. No ascending synovitis. There is no fluctuance, crepitus, malodor. There is no other area of tenderness except on surgical site. No other areas of tenderness to bilateral lower extremities.  No other open lesions or pre-ulcerative lesions.  No pain with calf compression, swelling, warmth, erythema.   Assessment and Plan:  Status post left tailors bunionectomy, with superficial wound dehiscence with improvement  -Treatment options discussed including all alternatives, risks, and complications -Debrided the hyperkeratotic tissue on the incision. Given the faint amount of erythema will start Bactrim which was prescribed today. Overall the wound is almost completely healed. -Discussed with him and open toed shoe to take pressure off the area. Otherwise continuing surgical shoe. Dispensed a compression anklet for swelling as well. -Monitor for any clinical signs or symptoms of infection and directed to call the office immediately should any  occur or go to the ER. -RTC 10 days or sooner if needed.   Celesta Gentile, DPM

## 2017-03-24 ENCOUNTER — Ambulatory Visit (INDEPENDENT_AMBULATORY_CARE_PROVIDER_SITE_OTHER): Payer: Self-pay | Admitting: Podiatry

## 2017-03-24 DIAGNOSIS — M21629 Bunionette of unspecified foot: Secondary | ICD-10-CM

## 2017-03-24 DIAGNOSIS — L97521 Non-pressure chronic ulcer of other part of left foot limited to breakdown of skin: Secondary | ICD-10-CM

## 2017-04-03 NOTE — Progress Notes (Signed)
Subjective: Chase Harris is a 56 y.o. is seen today in office s/p left tailors bunionectomy preformed on 11/30/16. He states he is doing well and is been wearing shoe. He states incision is healed he denies any redness or drainage or any swelling. Still gets pain to the bottom and he points to the submetatarsal 5 area. Denies any recent injury or trauma. He has no other concerns today no new complaints.  Denies any systemic complaints such as fevers, chills, nausea, vomiting. No calf pain, chest pain, shortness of breath.   Objective: General: No acute distress, AAOx3  DP/PT pulses palpable 2/4, CRT < 3 sec to all digits.  Protective sensation intact. Motor function intact.  Left foot: Hyperkeratotic tissue is present over the incision. Upon debridement the wound appears to be healed today there is no drainage or pus or any underlying ulceration. There is no swelling erythema, ascending cellulitis. There is faint edema. There is tenderness on metatarsal 5 area. There is noted. Tenderness identified at this time. No other open lesions or pre-ulcerative lesions.  No pain with calf compression, swelling, warmth, erythema.   Assessment and Plan:  Status post left tailors bunionectomy, with superficial wound dehiscence with improvement  -Treatment options discussed including all alternatives, risks, and complications -Incision appears to be healed today. There is no clinical signs of infection. He can remain in a regular shoe area directly recommended him to return to the orthotics the offloading pad to the sub-met 5 area. I do with a similar symptoms are becoming from the screw which is slightly elongated. I discussed with him hard removal we'll plan on doing this next 2 months if symptoms continue. -Monitor for any clinical signs or symptoms of infection and directed to call the office immediately should any occur or go to the ER. -RTC as scheduled r sooner if needed.   Celesta Gentile, DPM

## 2017-04-17 ENCOUNTER — Encounter: Payer: Self-pay | Admitting: Podiatry

## 2017-04-17 ENCOUNTER — Ambulatory Visit (INDEPENDENT_AMBULATORY_CARE_PROVIDER_SITE_OTHER): Payer: BLUE CROSS/BLUE SHIELD | Admitting: Podiatry

## 2017-04-17 ENCOUNTER — Ambulatory Visit (INDEPENDENT_AMBULATORY_CARE_PROVIDER_SITE_OTHER): Payer: BLUE CROSS/BLUE SHIELD

## 2017-04-17 DIAGNOSIS — M2012 Hallux valgus (acquired), left foot: Secondary | ICD-10-CM | POA: Diagnosis not present

## 2017-04-17 DIAGNOSIS — Z9889 Other specified postprocedural states: Secondary | ICD-10-CM

## 2017-04-17 DIAGNOSIS — T8484XA Pain due to internal orthopedic prosthetic devices, implants and grafts, initial encounter: Secondary | ICD-10-CM | POA: Insufficient documentation

## 2017-04-17 NOTE — Progress Notes (Signed)
Subjective: Chase Harris is a 56 y.o. is seen today in office s/p left tailors bunionectomy preformed on 11/30/16. He states that he is doing well and the incision remains well healed. However, he is still getting pain and points to submetatarsal 5 where he gets the pain with walking. He has been wearing a regular sandal. He has no other concerns. He also presents today to discuss possible hardware removal. Denies any systemic complaints such as fevers, chills, nausea, vomiting. No calf pain, chest pain, shortness of breath.   Objective: General: No acute distress, AAOx3; presents today in a regular shoe.  DP/PT pulses palpable 2/4, CRT < 3 sec to all digits.  Protective sensation intact. Motor function intact.  Left foot: Incision is well healed and there is no opening. There is no drainage or pus. There is no surrounding erythema or signs of infection. There continues to be tenderness submetatarsal 5 and there is trace edema to this area. No erythema or increase in warmth.  No other open lesions or pre-ulcerative lesions.  No pain with calf compression, swelling, warmth, erythema.   Assessment and Plan:  Status post left tailors bunionectomy, with superficial wound dehiscence with improvement  -Treatment options discussed including all alternatives, risks, and complications -X-rays were obtained and reviewed with the patient. Osteotomy appears to be healed. Skin marker is utilized to identify an area of pain. This testing to correspond to the distal portion of the screw on the lateral view. -At this time discussed the most conservative as well as surgical treatment options. This time given his pain level and I believe this is coming from the screw have recommended hardware removal. He wishes to proceed with this. -The incision placement as well as the postoperative course was discussed with the patient. I discussed risks of the surgery which include, but not limited to, infection, bleeding,  pain, swelling, need for further surgery, delayed or nonhealing, painful or ugly scar, numbness or sensation changes, over/under correction, recurrence, transfer lesions, further deformity, hardware failure, DVT/PE, loss of toe/foot. Patient understands these risks and wishes to proceed with surgery. The surgical consent was reviewed with the patient all 3 pages were signed. No promises or guarantees were given to the outcome of the procedure. All questions were answered to the best of my ability. Before the surgery the patient was encouraged to call the office if there is any further questions. The surgery will be performed at the [facility] on an outpatient basis. -Surgical shoe dispensed for postop.   Celesta Gentile, DPM

## 2017-04-17 NOTE — Patient Instructions (Signed)

## 2017-04-26 ENCOUNTER — Encounter: Payer: Self-pay | Admitting: Podiatry

## 2017-04-26 DIAGNOSIS — Z4889 Encounter for other specified surgical aftercare: Secondary | ICD-10-CM | POA: Diagnosis not present

## 2017-04-26 NOTE — Progress Notes (Signed)
Patient presents to the [facility] today for surgical intervention of the LEFT foot for hardware removal of a painful screw from previous tailors bunion surgery. The surgical consent was reviewed with the patient and we discussed the procedure as well as the postoperative course. I again discussed all alternatives, risks, complications. I answered all of their questions to the best of my ability and they wish to proceed with surgery. No promises or guarantees were given as to the outcome of the surgery.   The surgical consent was signed.   Patient is NPO since midnight.  The patient does not have have a history of blood clots or bleeding disorders. He is on aspirin 81mg  and took this last night.   Antibiotics given preop. Will send home with vicodin 5/325 1 tab PO q4-6 h prn pain disp #20.   No further questions from him or his wife who is with him today.   Celesta Gentile, DPM

## 2017-05-01 ENCOUNTER — Ambulatory Visit (INDEPENDENT_AMBULATORY_CARE_PROVIDER_SITE_OTHER): Payer: Self-pay | Admitting: Podiatry

## 2017-05-01 ENCOUNTER — Ambulatory Visit (INDEPENDENT_AMBULATORY_CARE_PROVIDER_SITE_OTHER): Payer: BLUE CROSS/BLUE SHIELD

## 2017-05-01 ENCOUNTER — Encounter: Payer: Self-pay | Admitting: Podiatry

## 2017-05-01 VITALS — BP 154/87 | HR 101

## 2017-05-01 DIAGNOSIS — T8484XA Pain due to internal orthopedic prosthetic devices, implants and grafts, initial encounter: Secondary | ICD-10-CM

## 2017-05-01 DIAGNOSIS — Z9889 Other specified postprocedural states: Secondary | ICD-10-CM | POA: Diagnosis not present

## 2017-05-01 MED ORDER — HYDROCODONE-ACETAMINOPHEN 5-325 MG PO TABS: 1.0000 | ORAL_TABLET | ORAL | 0 refills | Status: DC | PRN

## 2017-05-01 NOTE — Progress Notes (Signed)
Subjective: Chase Harris is a 56 y.o. is seen today in office s/p left foot HWR preformed on 04/26/2017. They state their pain is improving. He has been wearing the surgical shoe but states it is too big. He has been taking pain medication which helps but is decreasing the amount he has been taking. Denies any systemic complaints such as fevers, chills, nausea, vomiting. No calf pain, chest pain, shortness of breath.   Objective: General: No acute distress, AAOx3  DP/PT pulses palpable 2/4, CRT < 3 sec to all digits.  Protective sensation intact. Motor function intact.  Left foot: Incision is well coapted without any evidence of dehiscence and sutures intact. There is no surrounding erythema, ascending cellulitis, fluctuance, crepitus, malodor, drainage/purulence. There is mild edema around the surgical site. There is mild pain along the surgical site.  No other areas of tenderness to bilateral lower extremities.  No other open lesions or pre-ulcerative lesions.  No pain with calf compression, swelling, warmth, erythema.   Assessment and Plan:  Status post left foot HWR, doing well with no complications   -Treatment options discussed including all alternatives, risks, and complications -X-rays were obtained and reviewed with the patient. Status post removal of hardware removal. No evidence of acute fracture.  -Incision is healing well. Antibiotic ointment and a bandage applied.  -New surgical shoe was dispensed that was fitting correctly.  -Ice/elevation -Pain medication as needed. -Monitor for any clinical signs or symptoms of infection and DVT/PE and directed to call the office immediately should any occur or go to the ER. -Follow-up in 10 days for POSSIBLE suture removal  or sooner if any problems arise. In the meantime, encouraged to call the office with any questions, concerns, change in symptoms.   Celesta Gentile, DPM

## 2017-05-08 ENCOUNTER — Encounter: Payer: BLUE CROSS/BLUE SHIELD | Admitting: Podiatry

## 2017-05-11 ENCOUNTER — Encounter: Payer: Self-pay | Admitting: Podiatry

## 2017-05-11 ENCOUNTER — Ambulatory Visit (INDEPENDENT_AMBULATORY_CARE_PROVIDER_SITE_OTHER): Payer: BLUE CROSS/BLUE SHIELD | Admitting: Podiatry

## 2017-05-11 DIAGNOSIS — T8484XA Pain due to internal orthopedic prosthetic devices, implants and grafts, initial encounter: Secondary | ICD-10-CM

## 2017-05-11 DIAGNOSIS — Z9889 Other specified postprocedural states: Secondary | ICD-10-CM

## 2017-05-11 MED ORDER — HYDROCODONE-ACETAMINOPHEN 5-325 MG PO TABS: 1.0000 | ORAL_TABLET | Freq: Four times a day (QID) | ORAL | 0 refills | Status: DC | PRN

## 2017-05-11 NOTE — Progress Notes (Signed)
Subjective: Chase Harris is a 56 y.o. is seen today in office s/p left foot HWR preformed on 04/26/2017. He states he is doing much better. He still takes pain medicine intermittently. He is wearing the surgical shoe and tourniquet described elevated as much as possible when he does do quite a bit of walking and a surgical shoe. He states that the foot feels much better compared to the prior surgery and better since the surgery. Denies any systemic complaints such as fevers, chills, nausea, vomiting. No calf pain, chest pain, shortness of breath.   Objective: General: No acute distress, AAOx3  DP/PT pulses palpable 2/4, CRT < 3 sec to all digits.  Protective sensation intact. Motor function intact.  Left foot: Incision is well coapted without any evidence of dehiscence and sutures intact. There is no surrounding erythema, ascending cellulitis, fluctuance, crepitus, malodor, drainage/purulence. There is mild edema around the surgical site. There is no significant pain along the surgical site.  No other areas of tenderness to bilateral lower extremities.  No other open lesions or pre-ulcerative lesions.  No pain with calf compression, swelling, warmth, erythema.   Assessment and Plan:  Status post left foot HWR, doing well with no complications   -Treatment options discussed including all alternatives, risks, and complications -Incision appears to be healing well is no evidence of dehiscence. Given the history versus delayed healing able to stitches intact until next pointed. Antibiotic ointment was applied followed by a bandage. Continue dressing clean, dry, intact. Remaining surgical shoe. Ice and elevation. -Monitor for any clinical signs or symptoms of infection and directed to call the office immediately should any occur or go to the ER. -Follow-up in 1 week for POSSIBLE suture removal  or sooner if any problems arise. In the meantime, encouraged to call the office with any questions,  concerns, change in symptoms.   Celesta Gentile, DPM

## 2017-05-18 ENCOUNTER — Ambulatory Visit (INDEPENDENT_AMBULATORY_CARE_PROVIDER_SITE_OTHER): Payer: Self-pay | Admitting: Podiatry

## 2017-05-18 ENCOUNTER — Encounter: Payer: Self-pay | Admitting: Podiatry

## 2017-05-18 DIAGNOSIS — T8484XA Pain due to internal orthopedic prosthetic devices, implants and grafts, initial encounter: Secondary | ICD-10-CM

## 2017-05-18 DIAGNOSIS — Z9889 Other specified postprocedural states: Secondary | ICD-10-CM

## 2017-05-18 NOTE — Progress Notes (Signed)
Subjective: Chase Harris is a 56 y.o. is seen today in office s/p left foot HWR preformed on 04/26/2017. He presents today for suture removal. He states that he has remained in the surgical shoe and he is doing well. His pain is improved. He states that he can even walk in the surgical shoe without a limp and he states he has not felt like this in a long time. Denies any increase in swelling that he has noticed. Denies any systemic complaints such as fevers, chills, nausea, vomiting. No calf pain, chest pain, shortness of breath.   Objective: General: No acute distress, AAOx3  DP/PT pulses palpable 2/4, CRT < 3 sec to all digits.  Protective sensation intact. Motor function intact.  Left foot: Incision is well coapted without any evidence of dehiscence and sutures intact. There is no surrounding erythema, ascending cellulitis, fluctuance, crepitus, malodor, drainage/purulence. There is minimal edema around the surgical site. There is no significant pain along the surgical site. No signs of infection.  No other areas of tenderness to bilateral lower extremities.  No other open lesions or pre-ulcerative lesions.  No pain with calf compression, swelling, warmth, erythema.   Assessment and Plan:  Status post left foot HWR, doing well with no complications   -Treatment options discussed including all alternatives, risks, and complications -Incision appears to be healing well is no evidence of dehiscence. Sutures removed today. After removal the incision remains well coapted. Steri-stips were applied followed by a bandage. He can start to shower tomorrow. Apply antibiotic ointment and a bandage daily -Remain in surgical shoe.  -Ice/elevation.  -Pain medication as needed.  -Monitor for any clinical signs or symptoms of infection and directed to call the office immediately should any occur or go to the ER. -Follow-up in 10 days or sooner if any problems arise. In the meantime, encouraged to call  the office with any questions, concerns, change in symptoms.   Celesta Gentile, DPM

## 2017-05-26 NOTE — Progress Notes (Signed)
DOS 04-26-2017 REMOVAL FIXATION DEEP K-WIRE/SCREW LEFT

## 2017-06-05 ENCOUNTER — Encounter: Payer: Self-pay | Admitting: Podiatry

## 2017-06-05 ENCOUNTER — Ambulatory Visit (INDEPENDENT_AMBULATORY_CARE_PROVIDER_SITE_OTHER): Payer: Self-pay | Admitting: Podiatry

## 2017-06-05 VITALS — BP 163/86 | HR 87 | Resp 16

## 2017-06-05 DIAGNOSIS — Z9889 Other specified postprocedural states: Secondary | ICD-10-CM

## 2017-06-05 DIAGNOSIS — T8484XA Pain due to internal orthopedic prosthetic devices, implants and grafts, initial encounter: Secondary | ICD-10-CM

## 2017-06-05 MED ORDER — TRAMADOL HCL 50 MG PO TABS: 50.0000 mg | ORAL_TABLET | Freq: Two times a day (BID) | ORAL | 0 refills | Status: DC | PRN

## 2017-06-05 NOTE — Progress Notes (Signed)
Subjective: Chase Harris is a 56 y.o. is seen today in office s/p left foot HWR preformed on 04/26/2017. He states that overall he is doing better. He states that after he stands or walks for about 3 hours he starts to get pain to his foot and has take a break. He has been taking some pain medication at night as he begins and sharp pain to the toe at times. He denies any recent injury or trauma denies any swelling. He has not been wearing the orthotics. Denies any systemic complaints such as fevers, chills, nausea, vomiting. No calf pain, chest pain, shortness of breath.   Objective: General: No acute distress, AAOx3  DP/PT pulses palpable 2/4, CRT < 3 sec to all digits.  Protective sensation intact. Motor function intact.  Left foot: Incision is well coapted without any evidence of dehiscence and a scar is well formed. There is no tenderness on the surgical incision but there is a mild discomfort to the plantar aspect of the second metatarsal 5 area on the left foot. There is no other area pinpoint bony tenderness. There is no significant edema there is no erythema or increase in warmth. Subjectively he still gets some pain to this area as well as sharp pains at nighttime. No other areas of tenderness to bilateral lower extremities.  No other open lesions or pre-ulcerative lesions.  No pain with calf compression, swelling, warmth, erythema.   Assessment and Plan:  Status post left foot HWR, doing well with no complications   -Treatment options discussed including all alternatives, risks, and complications -Incision is well-healed today. I did modify his orthotics to help take pressure off of the area. I recommended start trying to wear the inserts again. I prescribed tramadol to help him with his pain at nighttime and also discussed an increase his gabapentin to 3 pills at nighttime. He is going try this as well. Discussed side effects the medication with increase in the dose. -Continue  supportive shoe gear. Do not go barefoot.  Celesta Gentile, DPM

## 2017-07-10 ENCOUNTER — Ambulatory Visit (INDEPENDENT_AMBULATORY_CARE_PROVIDER_SITE_OTHER): Payer: BLUE CROSS/BLUE SHIELD | Admitting: Podiatry

## 2017-07-10 ENCOUNTER — Encounter: Payer: Self-pay | Admitting: Podiatry

## 2017-07-10 DIAGNOSIS — T8484XA Pain due to internal orthopedic prosthetic devices, implants and grafts, initial encounter: Secondary | ICD-10-CM

## 2017-07-10 DIAGNOSIS — M21622 Bunionette of left foot: Secondary | ICD-10-CM

## 2017-07-10 DIAGNOSIS — Z9889 Other specified postprocedural states: Secondary | ICD-10-CM

## 2017-07-10 NOTE — Progress Notes (Signed)
Subjective: Chase Harris is a 56 y.o. is seen today in office s/p left foot HWR preformed on 04/26/2017. He states from a surgical standpoint she feels that the areas healed very well. He still has some discomfort if he stands for more than 2 hours on his feet stressing get pain underneath the fifth toe area and points submetatarsal 5. He has been more active on his feet and wearing a regular shoe. He is a recently on vacation at the beach and is on his feet quite a bit. He denies any significant swelling or redness to his feet overall he feels that he is improving. He does wear the inserts. Denies any systemic complaints such as fevers, chills, nausea, vomiting. No calf pain, chest pain, shortness of breath.   Objective: General: No acute distress, AAOx3  DP/PT pulses palpable 2/4, CRT < 3 sec to all digits.  Protective sensation intact. Motor function intact.  Left foot: Incision is well coapted without any evidence of dehiscence and a scar is well formed. There is no direct tenderness palpation of the surgical site. There is still some discomfort to metatarsal 5 area. There is no significant edema, erythema, increase in warmth. There is no clinical signs of infection present. Upon evaluation of his shoes he has an inverted type shoe for he rolls medially. There is no other areas of pinpoint tenderness identified bilaterally. No other areas of tenderness to bilateral lower extremities.  No other open lesions or pre-ulcerative lesions.  No pain with calf compression, swelling, warmth, erythema.   Assessment and Plan:  Status post left foot HWR, doing well with no complications; flatfoot   -Treatment options discussed including all alternatives, risks, and complications -From a surgical standpoint I believe that he has well-healed the surgery. Using that his remaining tenderness is due to his foot type as well as prominent metatarsal heads. I did have recommended evaluate him today as well for  modifications of his orthotics and he did modify the inserts to help give him more support and help take pressure off the foot and hold him more rectus. We'll see how this does appear also discussed the changes shoes. Continue ice and elevation. Compression anklet as needed. I'll see him back next 6 weeks or sooner if any issues are to arise. Call any questions or concerns in the meantime.  Celesta Gentile, DPM

## 2017-07-12 ENCOUNTER — Telehealth: Payer: Self-pay | Admitting: Podiatry

## 2017-07-12 NOTE — Telephone Encounter (Signed)
Spoke to pt and gave him benefit information for orthotic coverage.Covered at 100% if no office visit is filed. Pt said to go ahead with ordering them.

## 2017-07-31 ENCOUNTER — Encounter: Payer: Self-pay | Admitting: Podiatry

## 2017-07-31 ENCOUNTER — Ambulatory Visit (INDEPENDENT_AMBULATORY_CARE_PROVIDER_SITE_OTHER): Payer: BLUE CROSS/BLUE SHIELD | Admitting: Podiatry

## 2017-07-31 DIAGNOSIS — M21622 Bunionette of left foot: Secondary | ICD-10-CM

## 2017-07-31 DIAGNOSIS — I739 Peripheral vascular disease, unspecified: Secondary | ICD-10-CM | POA: Diagnosis not present

## 2017-08-01 ENCOUNTER — Telehealth: Payer: Self-pay | Admitting: Podiatry

## 2017-08-01 NOTE — Telephone Encounter (Signed)
I had an appointment and saw Dr. Jacqualyn Posey yesterday morning. He was supposed to send in a medication to my pharmacy for me. I went to the drugstore today and they said they haven't gotten a prescription from him. Please check into that and call me back at [phone].

## 2017-08-02 MED ORDER — GABAPENTIN 300 MG PO CAPS: 300.0000 mg | ORAL_CAPSULE | Freq: Every day | ORAL | 4 refills | Status: DC

## 2017-08-02 NOTE — Progress Notes (Signed)
Subjective: Chase Harris is a 56 y.o. is seen today in office s/p left foot HWR preformed on 04/26/2017. He states that from a surgical standpoint he gets some occasional discomfort after he stands for about 2 hours. His main concern is that he is noticing discoloration to the foot since last appointment. He feels that he gets some pain behind his knee at times but he denies any calf pain or swelling or cramping to his leg. He did bring up the circulation today as originally when he saw [facility] he was told he has significant blockage And after following up with Dr. Alvester Chou he felt it "was nothing to worry about". Overall he is concerned about a circulation to his feet. He has no other concerns today. Denies any systemic complaints such as fevers, chills, nausea, vomiting. No calf pain, chest pain, shortness of breath.   Objective: General: No acute distress, AAOx3  DP/PT pulses palpable 2/4, CRT < 3 sec to all digits.  Protective sensation intact. Motor function intact.  Left foot: Incision is well coapted without any evidence of dehiscence and a scar is well formed. Mild discomfort along the surgical site but overall this appears to be improved. There is mild swelling to the forefoot but there is no erythema or increase in warmth. There does appear to be some chronic skin discoloration to the left foot compared to the contralateral extremity also showed me pictures of this today. There is no other open lesions or pre-ulcerative lesions.  No pain with calf compression, swelling, warmth, erythema.   Assessment and Plan:  Status post left foot HWR, doing well with no complications; flatfoot   -Treatment options discussed including all alternatives, risks, and complications -From the surgical standpoint he is doing well. I do want to get a circulation checks. We will order this for [facility] to have a repeat arterial study and he will to go ahead and get this done. Continue  supportive shoe gear and orthotics however he is still waiting orthotics to come back in. -For nerve pain he is on gabapentin 300mg  at night. Refilled today.  -Monitor for any clinical signs or symptoms of infection and directed to call the office immediately should any occur or go to the ER. -RTC after vascular studies or sooner if needed.   Celesta Gentile, DPM

## 2017-08-03 ENCOUNTER — Telehealth: Payer: Self-pay | Admitting: *Deleted

## 2017-08-03 ENCOUNTER — Other Ambulatory Visit: Payer: Self-pay | Admitting: Podiatry

## 2017-08-03 DIAGNOSIS — R609 Edema, unspecified: Secondary | ICD-10-CM

## 2017-08-03 DIAGNOSIS — L819 Disorder of pigmentation, unspecified: Secondary | ICD-10-CM

## 2017-08-03 NOTE — Telephone Encounter (Signed)
-----   Message from Trula Slade, DPM sent at 08/02/2017  1:42 PM EST ----- Can you please order arterial studies due to swelling, discoloration? Thanks. He would like this done at [facility]

## 2017-08-03 NOTE — Telephone Encounter (Signed)
Faxed orders to [facility]. 

## 2017-08-04 ENCOUNTER — Ambulatory Visit (INDEPENDENT_AMBULATORY_CARE_PROVIDER_SITE_OTHER): Payer: BLUE CROSS/BLUE SHIELD

## 2017-08-04 ENCOUNTER — Telehealth: Payer: Self-pay | Admitting: *Deleted

## 2017-08-04 DIAGNOSIS — L819 Disorder of pigmentation, unspecified: Secondary | ICD-10-CM

## 2017-08-04 DIAGNOSIS — R609 Edema, unspecified: Secondary | ICD-10-CM

## 2017-08-04 LAB — VAS US LOWER EXTREMITY ARTERIAL DUPLEX
LATIBDISTSYS: 106 cm/s
LEFT PERO DIST SYS: 29 cm/s
LPOPDPSV: -73 cm/s
LPOPPPSV: 83 cm/s
LSFPPSV: 87 cm/s
Left super femoral dist sys PSV: -67 cm/s
Left super femoral mid sys PSV: -82 cm/s
RATIBDISTSYS: 41 cm/s
RSFDPSV: -74 cm/s
RTIBDISTSYS: 55 cm/s
Right peroneal sys PSV: 35 cm/s
Right popliteal dist sys PSV: -55 cm/s
Right popliteal prox sys PSV: 162 cm/s
Right super femoral mid sys PSV: -84 cm/s
Right super femoral prox sys PSV: -82 cm/s
left post tibial dist sys: 57 cm/s

## 2017-08-04 MED ORDER — GABAPENTIN 300 MG PO CAPS: ORAL_CAPSULE | ORAL | 3 refills | Status: DC

## 2017-08-04 NOTE — Telephone Encounter (Signed)
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 Calling about the e-script sent by Dr. Jacqualyn Posey. It is for gabapentin 300 mg daily but pt says its supposed to be 300 mg TID that he was on 300 mg BID but that Dr. Jacqualyn Posey was increasing it. If someone could please check that and get back with [place] or send [place] an updated prescription. Our phone number is [phone]. Thank you.

## 2017-08-04 NOTE — Telephone Encounter (Signed)
Pt states he takes Gabapentin 300mg  2 capsules at night, and Dr. Jacqualyn Posey was going to increase by one, but he is not sure when.

## 2017-08-04 NOTE — Telephone Encounter (Signed)
Can you ask him how he is taking it? Is he taking all 3 pills at night or is he spreading it out?

## 2017-08-04 NOTE — Telephone Encounter (Signed)
I wrote for 3 times a day. He can take the 2 two at night if he still wants then once in the morning. Or take one in the AM, lunch, night

## 2017-08-04 NOTE — Telephone Encounter (Signed)
I informed pt of Dr. Leigh Aurora orders and called to [place].

## 2017-08-04 NOTE — Telephone Encounter (Signed)
[place] states pt was previously on Gabapentin 300mg  tid, rx 08/02/2917 written for hs only. Routed message to Dr. Jacqualyn Posey.

## 2017-08-09 ENCOUNTER — Telehealth: Payer: Self-pay | Admitting: Podiatry

## 2017-08-09 DIAGNOSIS — R609 Edema, unspecified: Secondary | ICD-10-CM

## 2017-08-09 NOTE — Telephone Encounter (Signed)
I called pt to schedule an appt to [facility] and pt is seeing Rick on 11.19.18.[name]  Pt then asked if we have gotten his results from the vein doctor.Marland KitchenMarland Kitchen

## 2017-08-09 NOTE — Telephone Encounter (Signed)
Pt called and I informed him of Dr. Leigh Aurora review of doppler results. Pt said Dr. Jacqualyn Posey had not schedule another appt, but was waiting to see what these results were and then decide what to do.

## 2017-08-09 NOTE — Telephone Encounter (Addendum)
-----   Message from Trula Slade, DPM sent at 08/09/2017  7:05 AM EST ----- Left leg appears stable from a vascular standpoint and no worsening- please let him know Val. Thanks. Left message informing pt Dr. Jacqualyn Posey had reviewed circulation results and to call again.

## 2017-08-10 NOTE — Telephone Encounter (Signed)
Lets continue to try to wear a shoe and insert and let the area heal. I think the discoloration is from the swelling. If he is concerned we can get him in to see vascular again, but I don't think they are going to do anything. I would like to get a venous reflux study though to check the veins to make sure they are working well. Follow-up in 4 weeks or sooner if needed.

## 2017-08-11 NOTE — Telephone Encounter (Signed)
I informed pt of Dr. Leigh Aurora recommendations and orders for venous reflux. Pt states he would like to have the venous studies at [facility]. I told him I would send the orders there and he would be able to schedule with them when they opened. Pt states he has an appt with Liliane Channel Monday and will pick up his orthotics. Faxed orders to [facility].

## 2017-08-11 NOTE — Telephone Encounter (Signed)
Chase Harris, I talked to you this morning about getting the venous reflux study done. I called [facility] in [place] and they said they couldn't get it done until after Christmas. You had mentioned a place in [place], could you check with them to see or something to see if they could get it done sooner or give me the information. [phone]. Thank you.

## 2017-08-11 NOTE — Addendum Note (Signed)
Addended by: Harriett Sine D on: 08/11/2017 11:01 AM   Modules accepted: Orders

## 2017-08-11 NOTE — Telephone Encounter (Signed)
[facility] scheduled pt for 09/04/2017 at 12:30pm to arrive at 12:15pm. I informed pt and he said he knew the location.

## 2017-08-14 ENCOUNTER — Encounter: Payer: BLUE CROSS/BLUE SHIELD | Admitting: Orthotics

## 2017-08-14 DIAGNOSIS — M21629 Bunionette of unspecified foot: Secondary | ICD-10-CM | POA: Diagnosis not present

## 2017-09-04 ENCOUNTER — Inpatient Hospital Stay (HOSPITAL_COMMUNITY)
Admission: RE | Admit: 2017-09-04 | Payer: BLUE CROSS/BLUE SHIELD | Source: Ambulatory Visit | Attending: Podiatry | Admitting: Podiatry

## 2017-09-11 ENCOUNTER — Encounter: Payer: BLUE CROSS/BLUE SHIELD | Admitting: Podiatry

## 2017-09-20 ENCOUNTER — Ambulatory Visit (HOSPITAL_COMMUNITY)
Admission: RE | Admit: 2017-09-20 | Discharge: 2017-09-20 | Disposition: A | Discharge status: Home / Self Care | Payer: BLUE CROSS/BLUE SHIELD | Source: Ambulatory Visit | Attending: Cardiovascular Disease | Admitting: Cardiovascular Disease

## 2017-09-20 DIAGNOSIS — R609 Edema, unspecified: Secondary | ICD-10-CM | POA: Diagnosis not present

## 2017-09-28 ENCOUNTER — Ambulatory Visit (INDEPENDENT_AMBULATORY_CARE_PROVIDER_SITE_OTHER): Payer: BLUE CROSS/BLUE SHIELD | Admitting: Podiatry

## 2017-09-28 ENCOUNTER — Encounter: Payer: Self-pay | Admitting: Podiatry

## 2017-09-28 DIAGNOSIS — M21622 Bunionette of left foot: Secondary | ICD-10-CM | POA: Diagnosis not present

## 2017-09-28 DIAGNOSIS — R609 Edema, unspecified: Secondary | ICD-10-CM

## 2017-09-29 ENCOUNTER — Telehealth: Payer: Self-pay | Admitting: *Deleted

## 2017-09-29 NOTE — Telephone Encounter (Signed)
Faxed referral, clinicals and demographics to [facility].

## 2017-09-29 NOTE — Progress Notes (Signed)
Subjective: Chase Harris is a 57 y.o. is seen today in office s/p left foot HWR preformed on 04/26/2017. He states that the surgery site is doing well and the pain is improving. He is able to wear a regular shoe and he feels that he is gradually getting better.  He also presents today to discuss Pap smear results.  He does have some swelling to the left leg as well as some discoloration at times but denies any cramping or pain when walking.  He has no other concerns today. Denies any systemic complaints such as fevers, chills, nausea, vomiting. No calf pain, chest pain, shortness of breath.   Objective: General: No acute distress, AAOx3  DP/PT pulses palpable 2/4, CRT < 3 sec to all digits.  Protective sensation intact. Motor function intact.  Left foot: Incision is well coapted without any evidence of dehiscence and a scar is well formed.  Is minimal tenderness palpation of the surgical site and overall the symptoms have much improved.  There is mild edema of the left foot there is no significant erythema or increase in warmth.  There is no other areas of tenderness identified bilaterally.  There is no other open lesions or pre-ulcerative lesions.  No pain with calf compression, swelling, warmth, erythema.   Assessment and Plan:  Status post left foot HWR, doing well with no complications; venous insuffiencey   -Treatment options discussed including all alternatives, risks, and complications -Surgical standpoint he is doing well he does wear regular shoe with an insert pedal he will continue with this.  He states that his symptoms are continuing to improve.  On discharge in the postoperative course as he is improving I discussed with him there is any change in symptoms but there is any worsening or recurrence to call the office.  He understands this and agrees this plan. -Reviewed the venous reflux study and there was reflux in the left lower extremity.  Discussed with him compression socks as  well as vascular referral.  We will place a referral for vein specialist. Elevation  Trula Slade DPM

## 2017-09-29 NOTE — Telephone Encounter (Signed)
-----   Message from Trula Slade, DPM sent at 09/29/2017  1:03 PM EST ----- Done  ----- Message ----- From: Andres Ege, RN Sent: 09/29/2017  10:44 AM To: Trula Slade, DPM  Dr. Jacqualyn Posey, I need LOV 09/28/2017 to send to [facility]. Marcy Siren

## 2017-10-17 ENCOUNTER — Encounter (INDEPENDENT_AMBULATORY_CARE_PROVIDER_SITE_OTHER): Payer: Self-pay | Admitting: Vascular Surgery

## 2017-10-17 ENCOUNTER — Ambulatory Visit (INDEPENDENT_AMBULATORY_CARE_PROVIDER_SITE_OTHER): Payer: BLUE CROSS/BLUE SHIELD | Admitting: Vascular Surgery

## 2017-10-17 VITALS — BP 149/79 | HR 91 | Resp 18 | Ht 68.0 in | Wt 257.0 lb

## 2017-10-17 DIAGNOSIS — Z794 Long term (current) use of insulin: Secondary | ICD-10-CM

## 2017-10-17 DIAGNOSIS — E118 Type 2 diabetes mellitus with unspecified complications: Secondary | ICD-10-CM

## 2017-10-17 DIAGNOSIS — I10 Essential (primary) hypertension: Secondary | ICD-10-CM

## 2017-10-17 DIAGNOSIS — I83813 Varicose veins of bilateral lower extremities with pain: Secondary | ICD-10-CM | POA: Diagnosis not present

## 2017-10-17 NOTE — Progress Notes (Signed)
Patient ID: Chase Harris, male   DOB: 1961-01-20, 57 y.o.   MRN: [id number]  Chief Complaint  Patient presents with  . New Patient (Initial Visit)    Swelling and reflux    HPI Chase Harris is a 57 y.o. male.  I am asked to see the patient by Dr. Shanda Bumps for evaluation of leg pain and stasis dermatitis.  The patient presents with complaints of symptomatic varicosities of the lower extremities. The patient reports a long standing history of varicosities and they have become painful over time. There was no clear inciting event or causative factor that started the symptoms.  The left leg is more severly affected terms of swelling and discoloration although he may have more pain in the right. The patient elevates the legs for relief. The pain is described as aching and burning in the thighs and calves. The symptoms are generally most severe in the evening, particularly when they have been on their feet for long periods of time. Anti-inflammatories have been used to try to improve the symptoms with limited success. The patient complains of frequent swelling as an associated symptom. The patient has no previous history of deep venous thrombosis or superficial thrombophlebitis to their knowledge.  He did have arterial studies which showed normal arterial flow in both lower extremities several months ago.  He also had a left leg venous reflux study performed at institution which showed left great saphenous vein reflux.     Past Medical History:  Diagnosis Date  . Diabetes mellitus without complication (HCC)    Metformin  . Hypertension   . Prostate cancer [facility])     Past Surgical History:  Procedure Laterality Date  . HERNIA REPAIR    . PROSTATECTOMY      Family History  Problem Relation Age of Onset  . Congestive Heart Failure Father   . Hypertension Father   . Kidney disease Father   . Diabetes Paternal Grandmother   . Diabetes Paternal Aunt   . Hypothyroidism Mother       Social History Social History   Tobacco Use  . Smoking status: Former Research scientist (life sciences)  . Smokeless tobacco: Never Used  Substance Use Topics  . Alcohol use: No  . Drug use: No     Allergies  Allergen Reactions  . Penicillins Rash    At birth  . Doxycycline     Unknown - pt informed pharmacist 02/17/2017.  [name] Dynabac [Dirithromycin] Nausea And Vomiting    Other reaction(s): Abdominal Pain    Current Outpatient Medications  Medication Sig Dispense Refill  . albuterol (PROVENTIL HFA) 108 (90 Base) MCG/ACT inhaler Inhale into the lungs.    [name] aspirin EC 81 MG tablet Take by mouth.    . canagliflozin (INVOKANA) 300 MG TABS tablet Take by mouth.    . fluticasone (FLONASE) 50 MCG/ACT nasal spray Place into the nose.    [name] FOLIC ACID PO Take by mouth.    . gabapentin (NEURONTIN) 300 MG capsule Take 1 capsule (300mg ) in the morning and 2 capsules (600mg ) at bedtime. 90 capsule 3  . glyBURIDE (DIABETA) 5 MG tablet   10  . hydrochlorothiazide (HYDRODIURIL) 25 MG tablet   0  . hydroxychloroquine (PLAQUENIL) 200 MG tablet Take by mouth daily.    . insulin aspart (NOVOLOG) 100 UNIT/ML FlexPen Inject into the skin.    . Insulin Pen Needle (B-D ULTRAFINE III SHORT PEN) 31G X 8 MM MISC USE THREE TIMES A DAY    .  lisinopril (PRINIVIL,ZESTRIL) 40 MG tablet   0  . loratadine (CLARITIN) 10 MG tablet Take by mouth.    . metFORMIN (GLUCOPHAGE) 1000 MG tablet TAKE 1 TABLET IN  THE MORNING AND TAKE 1 AND 1/2 TABLET IN THE EVENING    . methotrexate 50 MG/2ML injection Take 1 CC SQ once a week , 12 weeks    . PREDNISONE PO Take by mouth.    . ranitidine (ZANTAC) 150 MG tablet Take by mouth.    . sulfamethoxazole-trimethoprim (BACTRIM,SEPTRA) 400-80 MG tablet Take by mouth.    . TRESIBA FLEXTOUCH 200 UNIT/ML SOPN Pt stated, "Takes 90 units at night time"  6  . collagenase (SANTYL) ointment Apply 1 application topically daily. (Patient not taking: Reported on 10/17/2017) 90 g 0  . HYDROcodone-acetaminophen  (NORCO/VICODIN) 5-325 MG tablet Take 1 tablet by mouth every 6 (six) hours as needed. (Patient not taking: Reported on 10/17/2017) 20 tablet 0  . insulin aspart protamine - aspart (NOVOLOG 70/30 MIX) (70-30) 100 UNIT/ML FlexPen Inject into the skin.    . Insulin Detemir (LEVEMIR FLEXTOUCH) 100 UNIT/ML Pen Levemir FlexTouch U-100 Insulin 100 unit/mL (3 mL) subcutaneous pen    . lisinopril-hydrochlorothiazide (PRINZIDE,ZESTORETIC) 10-12.5 MG tablet Take by mouth.    . lovastatin (MEVACOR) 20 MG tablet   2  . methotrexate (50 MG/ML) 1 g injection Inject into the vein once.    . traMADol (ULTRAM) 50 MG tablet Take 1 tablet (50 mg total) by mouth every 12 (twelve) hours as needed. (Patient not taking: Reported on 10/17/2017) 14 tablet 0   No current facility-administered medications for this visit.       REVIEW OF SYSTEMS (Negative unless checked)  Constitutional: [] Weight loss  [] Fever  [] Chills Cardiac: [] Chest pain   [] Chest pressure   [] Palpitations   [] Shortness of breath when laying flat   [] Shortness of breath at rest   [] Shortness of breath with exertion. Vascular:  [] Pain in legs with walking   [x] Pain in legs at rest   [] Pain in legs when laying flat   [] Claudication   [] Pain in feet when walking  [] Pain in feet at rest  [] Pain in feet when laying flat   [] History of DVT   [] Phlebitis   [x] Swelling in legs   [x] Varicose veins   [] Non-healing ulcers Pulmonary:   [] Uses home oxygen   [] Productive cough   [] Hemoptysis   [] Wheeze  [] COPD   [] Asthma Neurologic:  [] Dizziness  [] Blackouts   [] Seizures   [] History of stroke   [] History of TIA  [] Aphasia   [] Temporary blindness   [] Dysphagia   [] Weakness or numbness in arms   [] Weakness or numbness in legs Musculoskeletal:  [x] Arthritis   [] Joint swelling   [] Joint pain   [] Low back pain Hematologic:  [] Easy bruising  [] Easy bleeding   [] Hypercoagulable state   [] Anemic  [] Hepatitis Gastrointestinal:  [] Blood in stool   [] Vomiting blood   [] Gastroesophageal reflux/heartburn   [] Abdominal pain Genitourinary:  [] Chronic kidney disease   [] Difficult urination  [] Frequent urination  [] Burning with urination   [] Hematuria Skin:  [] Rashes   [] Ulcers   [] Wounds Psychological:  [] History of anxiety   []  History of major depression.    Physical Exam BP (!) 149/79 (BP Location: Left Arm, Patient Position: Sitting)   Pulse 91   Resp 18   Ht 5\' 8"  (1.727 m)   Wt 116.6 kg (257 lb)   BMI 39.08 kg/m  Gen:  WD/WN, NAD Head: [place]/AT, No temporalis wasting.  Ear/Nose/Throat:  Hearing grossly intact, dentition good Eyes: Sclera non-icteric. Conjunctiva clear Neck: Supple, no nuchal rigidity. Trachea midline Pulmonary:  Good air movement, no use of accessory muscles, respirations not labored.  Cardiac: RRR, No JVD Vascular: Varicosities diffuse and measuring up to 3 mm in the right lower extremity        Varicosities diffuse and measuring up to 3 mm in the left lower extremity Vessel Right Left  Radial Palpable Palpable                          PT Palpable Palpable  DP Palpable Palpable   Gastrointestinal: soft, non-tender/non-distended.  Musculoskeletal: M/S 5/5 throughout.  Trace BLE edema with mild to moderate stasis changes on the right, moderate stasis changes on the left. Neurologic: Sensation grossly intact in extremities.  Symmetrical.  Speech is fluent.  Psychiatric: Judgment intact, Mood & affect appropriate for pt's clinical situation. Dermatologic: No rashes or ulcers noted.  No cellulitis or open wounds.    Radiology No results found.  Labs Recent Results (from the past 2160 hour(s))  VAS [place] LOWER EXTREMITY ARTERIAL DUPLEX     Status: None   Collection Time: 08/04/17  8:36 AM  Result Value Ref Range   Left super femoral prox sys PSV 87 cm/s   Left super femoral mid sys PSV -82 cm/s   Left super femoral dist sys PSV -67 cm/s   Left popliteal prox sys PSV 83 cm/s   Left popliteal dist sys PSV -73 cm/s    Right super femoral prox sys PSV -82 cm/s   Right super femoral mid sys PSV -84 cm/s   Right super femoral dist sys PSV -74 cm/s   Right popliteal prox sys PSV 162 cm/s   Right popliteal dist sys PSV -55 cm/s   Right peroneal sys PSV 35 cm/s   Left ant tibial distal sys 106 cm/s   left post tibial dist sys 57 cm/s   LEFT PERO DIST SYS 29.00 cm/s   RIGHT ANT DIST TIBAL SYS PSV 41 cm/s   RIGHT POST TIB DIST SYS 55 cm/s    Assessment/Plan:  Type 2 diabetes mellitus (HCC) blood glucose control important in reducing the progression of atherosclerotic disease. Also, involved in wound healing. On appropriate medications.   Essential hypertension with goal blood pressure less than 140/90 blood pressure control important in reducing the progression of atherosclerotic disease. On appropriate oral medications.   Varicose veins of leg with pain, bilateral   The patient has symptoms consistent with chronic venous insufficiency. We discussed the natural history and treatment options for venous disease.  He has had a left leg reflux study which was positive for reflux in the left great saphenous vein, but has not had a reflux study on the right leg.  His right leg also has prominent varicosities and stasis changes as well as pain.  I recommended the regular use of 20 - 30 mm Hg compression stockings, and prescribed these today. I recommended leg elevation and anti-inflammatories as needed for pain. I have also recommended a complete venous duplex to assess the venous system for reflux or thrombotic issues. This can be done at the patient's convenience. I will see the patient back in 3 months to assess the response to conservative management, and determine further treatment options.     Leotis Pain 10/17/2017, 2:27 PM   This note was created with Dragon medical transcription system.  Any errors from dictation are unintentional.

## 2017-10-17 NOTE — Patient Instructions (Signed)
Varicose Veins Varicose veins are veins that have become enlarged and twisted. They are usually seen in the legs but can occur in other parts of the body as well. What are the causes? This condition is the result of valves in the veins not working properly. Valves in the veins help to return blood from the leg to the heart. If these valves are damaged, blood flows backward and backs up into the veins in the leg near the skin. This causes the veins to become larger. What increases the risk? People who are on their feet a lot, who are pregnant, or who are overweight are more likely to develop varicose veins. What are the signs or symptoms?  Bulging, twisted-appearing, bluish veins, most commonly found on the legs.  Leg pain or a feeling of heaviness. These symptoms may be worse at the end of the day.  Leg swelling.  Changes in skin color. How is this diagnosed? A health care provider can usually diagnose varicose veins by examining your legs. Your health care provider may also recommend an ultrasound of your leg veins. How is this treated? Most varicose veins can be treated at home.However, other treatments are available for people who have persistent symptoms or want to improve the cosmetic appearance of the varicose veins. These treatment options include:  Sclerotherapy. A solution is injected into the vein to close it off.  Laser treatment. A laser is used to heat the vein to close it off.  Radiofrequency vein ablation. An electrical current produced by radio waves is used to close off the vein.  Phlebectomy. The vein is surgically removed through small incisions made over the varicose vein.  Vein ligation and stripping. The vein is surgically removed through incisions made over the varicose vein after the vein has been tied (ligated). Follow these instructions at home:   Do not stand or sit in one position for long periods of time. Do not sit with your legs crossed. Rest with your  legs raised during the day.  Wear compression stockings as directed by your health care provider. These stockings help to prevent blood clots and reduce swelling in your legs.  Do not wear other tight, encircling garments around your legs, pelvis, or waist.  Walk as much as possible to increase blood flow.  Raise the foot of your bed at night with 2-inch blocks.  If you get a cut in the skin over the vein and the vein bleeds, lie down with your leg raised and press on it with a clean cloth until the bleeding stops. Then place a bandage (dressing) on the cut. See your health care provider if it continues to bleed. Contact a health care provider if:  The skin around your ankle starts to break down.  You have pain, redness, tenderness, or hard swelling in your leg over a vein.  You are uncomfortable because of leg pain. This information is not intended to replace advice given to you by your health care provider. Make sure you discuss any questions you have with your health care provider. Document Released: 06/22/2005 Document Revised: 02/18/2016 Document Reviewed: 03/15/2016 Elsevier Interactive Patient Education  2017 [employer]vier Inc.  

## 2017-10-17 NOTE — Assessment & Plan Note (Signed)
blood pressure control important in reducing the progression of atherosclerotic disease. On appropriate oral medications.  

## 2017-10-17 NOTE — Assessment & Plan Note (Signed)
blood glucose control important in reducing the progression of atherosclerotic disease. Also, involved in wound healing. On appropriate medications.  

## 2018-01-02 ENCOUNTER — Telehealth: Payer: Self-pay | Admitting: Podiatry

## 2018-01-02 NOTE — Telephone Encounter (Signed)
I was calling to speak to Chase Harris about my foot. She can call me back at [phone].

## 2018-01-02 NOTE — Telephone Encounter (Signed)
Pt states he is calling for Vickie, she had a little sleeve that went over her little toe and it had little silicone button and it really helped to keep her on her feet. I told pt that he could pick up the silicone sleeve in the [place] office.

## 2018-01-16 ENCOUNTER — Encounter (INDEPENDENT_AMBULATORY_CARE_PROVIDER_SITE_OTHER): Payer: BLUE CROSS/BLUE SHIELD

## 2018-01-16 ENCOUNTER — Ambulatory Visit (INDEPENDENT_AMBULATORY_CARE_PROVIDER_SITE_OTHER): Payer: BLUE CROSS/BLUE SHIELD | Admitting: Vascular Surgery

## 2018-01-22 DIAGNOSIS — G8929 Other chronic pain: Secondary | ICD-10-CM | POA: Insufficient documentation

## 2018-04-12 DIAGNOSIS — M79676 Pain in unspecified toe(s): Secondary | ICD-10-CM

## 2018-05-22 DIAGNOSIS — M1711 Unilateral primary osteoarthritis, right knee: Secondary | ICD-10-CM | POA: Insufficient documentation

## 2018-06-26 ENCOUNTER — Encounter: Payer: Self-pay | Admitting: *Deleted

## 2018-06-27 ENCOUNTER — Encounter
Admission: RE | Disposition: A | Discharge status: Home / Self Care | Payer: Self-pay | Source: Ambulatory Visit | Attending: Unknown Physician Specialty

## 2018-06-27 ENCOUNTER — Ambulatory Visit
Admission: RE | Admit: 2018-06-27 | Discharge: 2018-06-27 | Disposition: A | Discharge status: Home / Self Care | Payer: BLUE CROSS/BLUE SHIELD | Source: Ambulatory Visit | Attending: Unknown Physician Specialty | Admitting: Unknown Physician Specialty

## 2018-06-27 ENCOUNTER — Ambulatory Visit: Payer: BLUE CROSS/BLUE SHIELD | Admitting: Anesthesiology

## 2018-06-27 ENCOUNTER — Encounter: Payer: Self-pay | Admitting: *Deleted

## 2018-06-27 ENCOUNTER — Other Ambulatory Visit: Payer: Self-pay

## 2018-06-27 DIAGNOSIS — R1310 Dysphagia, unspecified: Secondary | ICD-10-CM | POA: Diagnosis not present

## 2018-06-27 DIAGNOSIS — Z87891 Personal history of nicotine dependence: Secondary | ICD-10-CM | POA: Insufficient documentation

## 2018-06-27 DIAGNOSIS — K449 Diaphragmatic hernia without obstruction or gangrene: Secondary | ICD-10-CM | POA: Insufficient documentation

## 2018-06-27 DIAGNOSIS — K5730 Diverticulosis of large intestine without perforation or abscess without bleeding: Secondary | ICD-10-CM | POA: Insufficient documentation

## 2018-06-27 DIAGNOSIS — E1142 Type 2 diabetes mellitus with diabetic polyneuropathy: Secondary | ICD-10-CM | POA: Insufficient documentation

## 2018-06-27 DIAGNOSIS — M1990 Unspecified osteoarthritis, unspecified site: Secondary | ICD-10-CM | POA: Diagnosis not present

## 2018-06-27 DIAGNOSIS — Z7951 Long term (current) use of inhaled steroids: Secondary | ICD-10-CM | POA: Insufficient documentation

## 2018-06-27 DIAGNOSIS — K2970 Gastritis, unspecified, without bleeding: Secondary | ICD-10-CM | POA: Insufficient documentation

## 2018-06-27 DIAGNOSIS — Z1211 Encounter for screening for malignant neoplasm of colon: Secondary | ICD-10-CM | POA: Diagnosis present

## 2018-06-27 DIAGNOSIS — G4730 Sleep apnea, unspecified: Secondary | ICD-10-CM | POA: Insufficient documentation

## 2018-06-27 DIAGNOSIS — Z8673 Personal history of transient ischemic attack (TIA), and cerebral infarction without residual deficits: Secondary | ICD-10-CM | POA: Diagnosis not present

## 2018-06-27 DIAGNOSIS — Z7982 Long term (current) use of aspirin: Secondary | ICD-10-CM | POA: Insufficient documentation

## 2018-06-27 DIAGNOSIS — D123 Benign neoplasm of transverse colon: Secondary | ICD-10-CM | POA: Insufficient documentation

## 2018-06-27 DIAGNOSIS — K219 Gastro-esophageal reflux disease without esophagitis: Secondary | ICD-10-CM | POA: Diagnosis not present

## 2018-06-27 DIAGNOSIS — K640 First degree hemorrhoids: Secondary | ICD-10-CM | POA: Diagnosis not present

## 2018-06-27 DIAGNOSIS — Z794 Long term (current) use of insulin: Secondary | ICD-10-CM | POA: Insufficient documentation

## 2018-06-27 DIAGNOSIS — I10 Essential (primary) hypertension: Secondary | ICD-10-CM | POA: Diagnosis not present

## 2018-06-27 HISTORY — DX: Sleep apnea, unspecified: G47.30

## 2018-06-27 HISTORY — DX: Cerebral infarction, unspecified: I63.9

## 2018-06-27 HISTORY — DX: Type 2 diabetes mellitus with diabetic polyneuropathy: E11.42

## 2018-06-27 HISTORY — PX: ESOPHAGOGASTRODUODENOSCOPY (EGD) WITH PROPOFOL: SHX5813

## 2018-06-27 HISTORY — DX: Gastro-esophageal reflux disease without esophagitis: K21.9

## 2018-06-27 HISTORY — DX: Personal history of malignant neoplasm of prostate: Z85.46

## 2018-06-27 HISTORY — DX: Unspecified osteoarthritis, unspecified site: M19.90

## 2018-06-27 HISTORY — DX: Constipation, unspecified: K59.00

## 2018-06-27 HISTORY — PX: COLONOSCOPY WITH PROPOFOL: SHX5780

## 2018-06-27 LAB — GLUCOSE, CAPILLARY: Glucose-Capillary: 110 mg/dL — ABNORMAL HIGH (ref 70–99)

## 2018-06-27 SURGERY — COLONOSCOPY WITH PROPOFOL
Anesthesia: General

## 2018-06-27 MED ORDER — PROPOFOL 10 MG/ML IV BOLUS
INTRAVENOUS | Status: DC | PRN
  Administered 2018-06-27 (×8): 30 mg via INTRAVENOUS
  Administered 2018-06-27: 20 mg via INTRAVENOUS
  Administered 2018-06-27: 30 mg via INTRAVENOUS
  Administered 2018-06-27: 20 mg via INTRAVENOUS
  Administered 2018-06-27 (×3): 30 mg via INTRAVENOUS

## 2018-06-27 MED ORDER — SODIUM CHLORIDE 0.9 % IV SOLN: INTRAVENOUS | Status: DC

## 2018-06-27 MED ORDER — SODIUM CHLORIDE 0.9 % IV SOLN
INTRAVENOUS | Status: DC
  Administered 2018-06-27: 08:00:00 via INTRAVENOUS

## 2018-06-27 MED ORDER — GLYCOPYRROLATE 0.2 MG/ML IJ SOLN
INTRAMUSCULAR | Status: DC | PRN
  Administered 2018-06-27: 0.2 mg via INTRAVENOUS

## 2018-06-27 MED ORDER — PROPOFOL 500 MG/50ML IV EMUL
INTRAVENOUS | Status: AC
  Filled 2018-06-27: qty 50

## 2018-06-27 NOTE — Transfer of Care (Signed)
Immediate Anesthesia Transfer of Care Note  Patient: Shade Kaley Rolph  Procedure(s) Performed: COLONOSCOPY WITH PROPOFOL (N/A ) ESOPHAGOGASTRODUODENOSCOPY (EGD) WITH PROPOFOL (N/A )  Patient Location: PACU  Anesthesia Type:General  Level of Consciousness: awake and alert   Airway & Oxygen Therapy: Patient Spontanous Breathing and Patient connected to nasal cannula oxygen  Post-op Assessment: Report given to RN and Post -op Vital signs reviewed and stable  Post vital signs: Reviewed and stable  Last Vitals:  Vitals Value Taken Time  BP 120/70 06/27/2018  8:27 AM  Temp 36.2 C 06/27/2018  8:20 AM  Pulse 79 06/27/2018  8:27 AM  Resp 15 06/27/2018  8:27 AM  SpO2 98 % 06/27/2018  8:27 AM  Vitals shown include unvalidated device data.  Last Pain:  Vitals:   06/27/18 0827  TempSrc:   PainSc: 0-No pain         Complications: No apparent anesthesia complications

## 2018-06-27 NOTE — Op Note (Signed)
[facility] Gastroenterology Patient Name: Chase Harris Procedure Date: 06/27/2018 7:54 AM MRN: [id number] Account #: [id number] Date of Birth: 09-Jun-1961 Admit Type: Outpatient Age: 57 Room: [facility] ENDO ROOM 2 Gender: Male Note Status: Finalized Procedure:            Colonoscopy Indications:          Screening for colorectal malignant neoplasm Providers:            Manya Silvas, MD Referring MD:         Dion Body (Referring MD) Medicines:            Propofol per Anesthesia Complications:        No immediate complications. Procedure:            Pre-Anesthesia Assessment:                       - After reviewing the risks and benefits, the patient                        was deemed in satisfactory condition to undergo the                        procedure.                       After obtaining informed consent, the colonoscope was                        passed under direct vision. Throughout the procedure,                        the patient's blood pressure, pulse, and oxygen                        saturations were monitored continuously. The                        Colonoscope was introduced through the anus and                        advanced to the the cecum, identified by appendiceal                        orifice and ileocecal valve. The colonoscopy was                        performed without difficulty. The patient tolerated the                        procedure well. The quality of the bowel preparation                        was adequate to identify polyps. Findings:      A small polyp was found in the transverse colon. The polyp was sessile.       The polyp was removed with a hot snare. Resection and retrieval were       complete.      A few small-mouthed diverticula were found in the sigmoid colon.      Internal hemorrhoids were found during endoscopy. The hemorrhoids were       small and  Grade I (internal hemorrhoids that do not prolapse).      The exam was otherwise without abnormality. Impression:           - One small polyp in the transverse colon, removed with                        a hot snare. Resected and retrieved.                       - Diverticulosis in the sigmoid colon.                       - Internal hemorrhoids.                       - The examination was otherwise normal. Recommendation:       - Await pathology results. Manya Silvas, MD 06/27/2018 8:21:04 AM This report has been signed electronically. Number of Addenda: 0 Note Initiated On: 06/27/2018 7:54 AM Scope Withdrawal Time: 0 hours 12 minutes 35 seconds  Total Procedure Duration: 0 hours 17 minutes 34 seconds       [facility]

## 2018-06-27 NOTE — Anesthesia Preprocedure Evaluation (Signed)
Anesthesia Evaluation  Patient identified by MRN, date of birth, ID band Patient awake    Reviewed: Allergy & Precautions, H&P , NPO status , Patient's Chart, lab work & pertinent test results, reviewed documented beta blocker date and time   Airway Mallampati: II   Neck ROM: full    Dental  (+) Poor Dentition, Teeth Intact   Pulmonary neg pulmonary ROS, sleep apnea and Continuous Positive Airway Pressure Ventilation , former smoker,    Pulmonary exam normal        Cardiovascular Exercise Tolerance: Poor hypertension, On Medications + Peripheral Vascular Disease  negative cardio ROS Normal cardiovascular exam Rhythm:regular Rate:Normal     Neuro/Psych  Headaches,  Neuromuscular disease CVA negative neurological ROS  negative psych ROS   GI/Hepatic negative GI ROS, Neg liver ROS, GERD  Medicated,  Endo/Other  negative endocrine ROSdiabetesMorbid obesity  Renal/GU negative Renal ROS  negative genitourinary   Musculoskeletal   Abdominal   Peds  Hematology negative hematology ROS (+)   Anesthesia Other Findings Past Medical History: No date: Arthritis No date: Constipation No date: Diabetes mellitus without complication (HCC)     Comment:  Metformin No date: Diabetic peripheral neuropathy (HCC) No date: GERD (gastroesophageal reflux disease) No date: History of prostate cancer No date: Hypertension No date: Prostate cancer ([place]) No date: Sleep apnea No date: Stroke [facility]) Past Surgical History: No date: HERNIA REPAIR No date: PROSTATECTOMY No date: TAYLOR BUNIONECTOMY No date: WISDOM TOOTH EXTRACTION   Reproductive/Obstetrics negative OB ROS                             Anesthesia Physical Anesthesia Plan  ASA: III  Anesthesia Plan: General   Post-op Pain Management:    Induction:   PONV Risk Score and Plan:   Airway Management Planned:   Additional Equipment:    Intra-op Plan:   Post-operative Plan:   Informed Consent: I have reviewed the patients History and Physical, chart, labs and discussed the procedure including the risks, benefits and alternatives for the proposed anesthesia with the patient or authorized representative who has indicated his/her understanding and acceptance.   Dental Advisory Given  Plan Discussed with: CRNA  Anesthesia Plan Comments:         Anesthesia Quick Evaluation

## 2018-06-27 NOTE — Anesthesia Post-op Follow-up Note (Signed)
Anesthesia QCDR form completed.        

## 2018-06-27 NOTE — Op Note (Addendum)
[facility] Patient Name: Chase Harris Procedure Date: 06/27/2018 7:15 AM MRN: [id number] Account #: [id number] Date of Birth: 1961-08-14 Admit Type: Outpatient Age: 57 Room: [facility] ENDO ROOM 2 Gender: Male Note Status: Finalized Procedure:            Upper GI endoscopy Indications:          Dysphagia Providers:            Manya Silvas, MD Referring MD:         Dion Body (Referring MD) Medicines:            Propofol per Anesthesia Complications:        No immediate complications. Procedure:            Pre-Anesthesia Assessment:                       - After reviewing the risks and benefits, the patient                        was deemed in satisfactory condition to undergo the                        procedure.                       - After reviewing the risks and benefits, the patient                        was deemed in satisfactory condition to undergo the                        procedure.                       After obtaining informed consent, the endoscope was                        passed under direct vision. Throughout the procedure,                        the patient's blood pressure, pulse, and oxygen                        saturations were monitored continuously. The Endoscope                        was introduced through the mouth, and advanced to the                        second part of duodenum. The upper GI endoscopy was                        accomplished with some difficulty. The patient                        tolerated the procedure fairly well. Findings:      The Z-line was irregular and was found 40 cm from the incisors. Biopsy       done.      A small hiatal hernia was present. The remainder of the procedure was       done without abnormal findings.  The examined duodenum was normal. Impression:           - Z-line irregular, 40 cm from the incisors.                       - Small hiatal hernia.            - Normal examined duodenum.                       - No specimens collected. Recommendation:       - Await pathology results. Manya Silvas, MD 06/27/2018 7:53:24 AM This report has been signed electronically. Number of Addenda: 0 Note Initiated On: 06/27/2018 7:15 AM      [facility]

## 2018-06-27 NOTE — Anesthesia Postprocedure Evaluation (Signed)
Anesthesia Post Note  Patient: Chase Harris  Procedure(s) Performed: COLONOSCOPY WITH PROPOFOL (N/A ) ESOPHAGOGASTRODUODENOSCOPY (EGD) WITH PROPOFOL (N/A )  Patient location during evaluation: PACU Anesthesia Type: General Level of consciousness: awake and alert Pain management: pain level controlled Vital Signs Assessment: post-procedure vital signs reviewed and stable Respiratory status: spontaneous breathing, nonlabored ventilation, respiratory function stable and patient connected to nasal cannula oxygen Cardiovascular status: blood pressure returned to baseline and stable Postop Assessment: no apparent nausea or vomiting Anesthetic complications: no     Last Vitals:  Vitals:   06/27/18 0820 06/27/18 0827  BP: 120/70 120/70  Pulse: 80 78  Resp: 16 14  Temp: (!) 36.2 C   SpO2: 96% 97%    Last Pain:  Vitals:   06/27/18 0827  TempSrc:   PainSc: 0-No pain                 Molli Barrows

## 2018-06-27 NOTE — H&P (Signed)
Primary Care Physician:  Dion Body, MD Primary Gastroenterologist:  Dr. Vira Agar  Pre-Procedure History & Physical: HPI:  Chase Harris is a 57 y.o. male is here for an endoscopy and colonoscopy.  Done for [facility] colon polyps and dysphagia.   Past Medical History:  Diagnosis Date  . Arthritis   . Constipation   . Diabetes mellitus without complication (HCC)    Metformin  . Diabetic peripheral neuropathy ([place])   . GERD (gastroesophageal reflux disease)   . History of prostate cancer   . Hypertension   . Prostate cancer ([place])   . Sleep apnea   . Stroke [facility])     Past Surgical History:  Procedure Laterality Date  . HERNIA REPAIR    . PROSTATECTOMY    . TAYLOR BUNIONECTOMY    . WISDOM TOOTH EXTRACTION      Prior to Admission medications   Medication Sig Start Date End Date Taking? Authorizing Provider  aspirin EC 81 MG tablet Take by mouth.   Yes [provider]  canagliflozin (INVOKANA) 300 MG TABS tablet Take by mouth. 05/27/15  Yes [provider]  ezetimibe (ZETIA) 10 MG tablet Take 10 mg by mouth daily.   Yes [provider]  fluticasone (FLONASE) 50 MCG/ACT nasal spray Place into the nose. 03/09/16  Yes [provider]  FOLIC ACID PO Take by mouth.   Yes [provider]  gabapentin (NEURONTIN) 300 MG capsule Take 1 capsule (300mg ) in the morning and 2 capsules (600mg ) at bedtime. 08/04/17  Yes Trula Slade, DPM  glyBURIDE (DIABETA) 5 MG tablet  08/01/16  Yes [provider]  hydrochlorothiazide (HYDRODIURIL) 25 MG tablet  09/11/17  Yes [provider]  insulin aspart (NOVOLOG) 100 UNIT/ML FlexPen Inject into the skin. 03/02/16  Yes [provider]  insulin aspart protamine - aspart (NOVOLOG 70/30 MIX) (70-30) 100 UNIT/ML FlexPen Inject into the skin.   Yes [provider]  Insulin Pen Needle (B-D ULTRAFINE III SHORT PEN) 31G X 8 MM MISC USE THREE TIMES A DAY 04/01/16  Yes [provider]  lisinopril (PRINIVIL,ZESTRIL) 40 MG tablet  09/11/17  Yes [provider]  loratadine (CLARITIN) 10 MG tablet Take by mouth.   Yes [provider]  metFORMIN (GLUCOPHAGE) 1000 MG tablet TAKE 1 TABLET IN  THE MORNING AND TAKE 1 AND 1/2 TABLET IN THE EVENING 12/01/15  Yes [provider]  methotrexate (50 MG/ML) 1 g injection Inject into the vein once.   Yes [provider]  methotrexate 50 MG/2ML injection Take 1 CC SQ once a week , 12 weeks 09/21/17  Yes [provider]  omeprazole (PRILOSEC) 20 MG capsule Take 20 mg by mouth daily.   Yes [provider]  PREDNISONE PO Take by mouth.   Yes [provider]  ranitidine (ZANTAC) 150 MG tablet Take by mouth.   Yes [provider]  TRESIBA FLEXTOUCH 200 UNIT/ML SOPN Pt stated, "Takes 90 units at night time" 05/17/17  Yes [provider]  albuterol (PROVENTIL HFA) 108 (90 Base) MCG/ACT inhaler Inhale into the lungs. 06/01/17 06/01/18  [provider]  collagenase (SANTYL) ointment Apply 1 application topically daily. Patient not taking: Reported on 10/17/2017 01/13/17   Trula Slade, DPM  HYDROcodone-acetaminophen (NORCO/VICODIN) 5-325 MG tablet Take 1 tablet by mouth every 6 (six) hours as needed. Patient not taking: Reported on 10/17/2017 05/11/17   Trula Slade, DPM  hydroxychloroquine (PLAQUENIL) 200 MG tablet Take by mouth  daily.    [provider]  Insulin Detemir (LEVEMIR FLEXTOUCH) 100 UNIT/ML Pen Levemir FlexTouch U-100 Insulin 100 unit/mL (3 mL) subcutaneous pen    [provider]  lisinopril-hydrochlorothiazide (PRINZIDE,ZESTORETIC) 10-12.5 MG tablet Take by mouth. 03/16/16   [provider]  lovastatin (MEVACOR) 20 MG tablet  09/20/17   [provider]  traMADol (ULTRAM) 50 MG tablet Take 1 tablet (50 mg total) by mouth every 12 (twelve) hours as needed. Patient not taking: Reported on 10/17/2017  06/05/17   Trula Slade, DPM    Allergies as of 05/07/2018 - Review Complete 10/17/2017  Allergen Reaction Noted  . Penicillins Rash 02/24/2014  . Doxycycline  02/17/2017  . Dynabac [dirithromycin] Nausea And Vomiting 02/24/2014    Family History  Problem Relation Age of Onset  . Congestive Heart Failure Father   . Hypertension Father   . Kidney disease Father   . Diabetes Paternal Grandmother   . Diabetes Paternal Aunt   . Hypothyroidism Mother     Social History   Socioeconomic History  . Marital status: Married    Spouse name: Not on file  . Number of children: Not on file  . Years of education: Not on file  . Highest education level: Not on file  Occupational History  . Not on file  Social Needs  . Financial resource strain: Not on file  . Food insecurity:    Worry: Not on file    Inability: Not on file  . Transportation needs:    Medical: Not on file    Non-medical: Not on file  Tobacco Use  . Smoking status: Former Research scientist (life sciences)  . Smokeless tobacco: Never Used  Substance and Sexual Activity  . Alcohol use: Yes    Alcohol/week: 4.0 standard drinks    Types: 2 Cans of beer, 2 Shots of liquor per week  . Drug use: No  . Sexual activity: Yes  Lifestyle  . Physical activity:    Days per week: Not on file    Minutes per session: Not on file  . Stress: Not on file  Relationships  . Social connections:    Talks on phone: Not on file    Gets together: Not on file    Attends religious service: Not on file    Active member of club or organization: Not on file    Attends meetings of clubs or organizations: Not on file    Relationship status: Not on file  . Intimate partner violence:    Fear of current or ex partner: Not on file    Emotionally abused: Not on file    Physically abused: Not on file    Forced sexual activity: Not on file  Other Topics Concern  . Not on file  Social History Narrative  . Not on file    Review of Systems: See HPI, otherwise  negative ROS  Physical Exam: BP 109/73   Pulse 78   Temp (!) 97.4 F (36.3 C) (Tympanic)   Resp 18   Ht 5\' 8"  (1.727 m)   Wt 116.6 kg   SpO2 99%   BMI 39.09 kg/m  General:   Alert,  pleasant and cooperative in NAD Head:  Normocephalic and atraumatic. Neck:  Supple; no masses or thyromegaly. Lungs:  Clear throughout to auscultation.    Heart:  Regular rate and rhythm. Abdomen:  Soft, nontender and nondistended. Normal bowel sounds, without guarding, and without rebound.   Neurologic:  Alert and  oriented x4;  grossly normal neurologically.  Impression/Plan: RAKESH DUTKO is here for an endoscopy and colonoscopy to be performed for [facility] colon polyps and dysphagia.  Risks, benefits, limitations, and alternatives regarding  endoscopy and colonoscopy have been reviewed with the patient.  Questions have been answered.  All parties agreeable.   Gaylyn Cheers, MD  06/27/2018, 7:32 AM

## 2018-06-28 ENCOUNTER — Encounter: Payer: Self-pay | Admitting: Unknown Physician Specialty

## 2018-06-28 LAB — SURGICAL PATHOLOGY

## 2018-08-16 ENCOUNTER — Other Ambulatory Visit: Payer: Self-pay | Admitting: Podiatry

## 2018-08-27 DIAGNOSIS — M79676 Pain in unspecified toe(s): Secondary | ICD-10-CM

## 2019-03-01 ENCOUNTER — Encounter: Payer: Self-pay | Admitting: Podiatry

## 2019-03-01 ENCOUNTER — Ambulatory Visit (INDEPENDENT_AMBULATORY_CARE_PROVIDER_SITE_OTHER): Payer: Commercial Managed Care - PPO | Admitting: Podiatry

## 2019-03-01 ENCOUNTER — Other Ambulatory Visit: Payer: Self-pay

## 2019-03-01 ENCOUNTER — Ambulatory Visit (INDEPENDENT_AMBULATORY_CARE_PROVIDER_SITE_OTHER): Payer: Commercial Managed Care - PPO

## 2019-03-01 VITALS — Temp 98.1°F

## 2019-03-01 DIAGNOSIS — M21622 Bunionette of left foot: Secondary | ICD-10-CM

## 2019-03-01 DIAGNOSIS — M722 Plantar fascial fibromatosis: Secondary | ICD-10-CM

## 2019-03-01 DIAGNOSIS — M84375A Stress fracture, left foot, initial encounter for fracture: Secondary | ICD-10-CM | POA: Diagnosis not present

## 2019-03-01 DIAGNOSIS — E11319 Type 2 diabetes mellitus with unspecified diabetic retinopathy without macular edema: Secondary | ICD-10-CM | POA: Insufficient documentation

## 2019-03-01 MED ORDER — GABAPENTIN 300 MG PO CAPS: ORAL_CAPSULE | ORAL | 3 refills | Status: DC

## 2019-03-01 MED ORDER — MELOXICAM 15 MG PO TABS: 15.0000 mg | ORAL_TABLET | Freq: Every day | ORAL | 0 refills | Status: DC

## 2019-03-01 NOTE — Progress Notes (Signed)
Subjective: 58 year old male presents the office today for concerns of left heel pain which is been ongoing for greater than 6 months is worsened over the weekend when he was at the beach.  He has been in the morning [place] but not, presents for some time and stands back up he gets a lot of pain on his heel.  Denies recent injury or trauma.  He does state he gets pain also at the top of the foot ongoing longer.  He is concerned about a possible hairline fracture to the top of his foot did not happen.  No significant swelling.  He also associates some pain on the previous surgical site to the tailor's bunion area as he has been compensating. Denies any systemic complaints such as fevers, chills, nausea, vomiting. No acute changes since last appointment, and no other complaints at this time.   Objective: AAO x3, NAD DP/PT pulses palpable bilaterally, CRT less than 3 seconds Tenderness to palpation along the plantar medial tubercle of the calcaneus at the insertion of plantar fascia on the left foot. There is no pain along the course of the plantar fascia within the arch of the foot. Plantar fascia appears to be intact. There is no pain with lateral compression of the calcaneus or pain with vibratory sensation. There is no pain along the course or insertion of the achilles tendon.  There is tenderness on the dorsal aspect of the foot mostly on third metatarsal cuneiform joint.  There is no significant edema, erythema.  Minimal tenderness palpation along the metatarsal heads but there is no pinpoint tenderness.  No significant pain today he states he does hurt at times however.  No other areas of tenderness to bilateral lower extremities. No open lesions or pre-ulcerative lesions.  No pain with calf compression, swelling, warmth, erythema  Assessment: Left heel pain, plantar fasciitis; midfoot pain likely form arthritis but concern for fracture  Plan: -All treatment options discussed with the patient  including all alternatives, risks, complications.  -X-rays were obtained and reviewed.  Arthritic changes present in the midfoot.  No spurs present.  Questionable area of radiolucency along the cuneiform on the AP view but it is more from overlap I do not see this on the oblique view but difficult to evaluate.  Given amount of tenderness he is having however.  Treat for stress fracture. -Steroid injection to the heel.  See procedure below. -Prescribed mobic. Discussed side effects of the medication and directed to stop if any are to occur and call the office.  -CAM boot was dispensed. -Ice, elevation, limit activity. -Refill gabapentin -Patient encouraged to call the office with any questions, concerns, change in symptoms.   RTC 3 weeks or sooner if needed  Chase Harris DPM

## 2019-03-25 ENCOUNTER — Ambulatory Visit (INDEPENDENT_AMBULATORY_CARE_PROVIDER_SITE_OTHER): Payer: Commercial Managed Care - PPO

## 2019-03-25 ENCOUNTER — Ambulatory Visit (INDEPENDENT_AMBULATORY_CARE_PROVIDER_SITE_OTHER): Payer: Commercial Managed Care - PPO | Admitting: Podiatry

## 2019-03-25 ENCOUNTER — Other Ambulatory Visit: Payer: Self-pay

## 2019-03-25 ENCOUNTER — Encounter: Payer: Self-pay | Admitting: Podiatry

## 2019-03-25 VITALS — Temp 98.4°F

## 2019-03-25 DIAGNOSIS — M84375A Stress fracture, left foot, initial encounter for fracture: Secondary | ICD-10-CM | POA: Diagnosis not present

## 2019-03-25 DIAGNOSIS — M722 Plantar fascial fibromatosis: Secondary | ICD-10-CM

## 2019-03-25 DIAGNOSIS — M779 Enthesopathy, unspecified: Secondary | ICD-10-CM

## 2019-03-25 NOTE — Progress Notes (Signed)
Subjective: 58 year old male presents the office today for follow-up evaluation of heel pain, plantar fasciitis.  He states that he has not been in the boot except for when he was at the [employer] she was getting up.  He did not wear the boot.  Use of the walker.  When he did so he had increased pain mostly to the lateral aspect ankle he points to be stable to walk on it.  He has gotten better since then.  No recent injury or falls.  Some swelling.  He states the injection did help him quite a bit. He is asking for another injection today. Denies any systemic complaints such as fevers, chills, nausea, vomiting. No acute changes since last appointment, and no other complaints at this time.   Objective: AAO x3, NAD DP/PT pulses palpable bilaterally, CRT less than 3 seconds There is tenderness palpation on the course of the peroneal tendons inferior to the lateral malleolus.  Over the peroneal tendon appears to be intact.  Mild edema.  No tenderness on the fibula, tibia.  No proximal tib-fib pain.  Mild discomfort on the plantar medial tubercle of the calcaneus at insertion of the fascia.  Plantar fascia appears to be intact.  No pain with our compression of calcaneus. No pain to the dorsal foot today.  No open lesions or pre-ulcerative lesions.  No pain with calf compression, swelling, warmth, erythema  Assessment: 58 year old male peroneal tendinitis, plantar fasciitis  Plan: -All treatment options discussed with the patient including all alternatives, risks, complications.  -A second steroid injection was performed today.  See procedure below. -Given his ankle tendinitis I do recommend continue immobilization in a cam boot.  He states he cannot wear the boot while outside.  Dispensed a Tri-Lock ankle brace.  Ice daily.  Meloxicam. -Patient encouraged to call the office with any questions, concerns, change in symptoms.   Procedure: Injection Tendon/Ligament Discussed alternatives, risks,  complications and verbal consent was obtained.  Location: LEFT  plantar fascia at the glabrous junction; medial approach. Skin Prep: Alcohol Injectate: 0.5cc 0.5% marcaine plain, 0.5 cc 2% lidocaine plain and, 1 cc kenalog 10. Disposition: Patient tolerated procedure well. Injection site dressed with a band-aid.  Post-injection care was discussed and return precautions discussed.   Trula Slade DPM

## 2019-04-07 ENCOUNTER — Other Ambulatory Visit: Payer: Self-pay

## 2019-04-07 ENCOUNTER — Inpatient Hospital Stay
Admission: EM | Admit: 2019-04-07 | Discharge: 2019-04-08 | DRG: 872 | Disposition: A | Discharge status: Home / Self Care | Payer: Commercial Managed Care - PPO | Attending: Internal Medicine | Admitting: Internal Medicine

## 2019-04-07 ENCOUNTER — Emergency Department: Payer: Commercial Managed Care - PPO

## 2019-04-07 DIAGNOSIS — Z79899 Other long term (current) drug therapy: Secondary | ICD-10-CM | POA: Diagnosis not present

## 2019-04-07 DIAGNOSIS — Z833 Family history of diabetes mellitus: Secondary | ICD-10-CM

## 2019-04-07 DIAGNOSIS — E11319 Type 2 diabetes mellitus with unspecified diabetic retinopathy without macular edema: Secondary | ICD-10-CM | POA: Diagnosis present

## 2019-04-07 DIAGNOSIS — Y929 Unspecified place or not applicable: Secondary | ICD-10-CM | POA: Diagnosis not present

## 2019-04-07 DIAGNOSIS — Z881 Allergy status to other antibiotic agents status: Secondary | ICD-10-CM

## 2019-04-07 DIAGNOSIS — A419 Sepsis, unspecified organism: Principal | ICD-10-CM | POA: Diagnosis present

## 2019-04-07 DIAGNOSIS — Z791 Long term (current) use of non-steroidal anti-inflammatories (NSAID): Secondary | ICD-10-CM | POA: Diagnosis not present

## 2019-04-07 DIAGNOSIS — Z8546 Personal history of malignant neoplasm of prostate: Secondary | ICD-10-CM

## 2019-04-07 DIAGNOSIS — Z794 Long term (current) use of insulin: Secondary | ICD-10-CM | POA: Diagnosis not present

## 2019-04-07 DIAGNOSIS — Z6838 Body mass index (BMI) 38.0-38.9, adult: Secondary | ICD-10-CM

## 2019-04-07 DIAGNOSIS — Y9389 Activity, other specified: Secondary | ICD-10-CM | POA: Diagnosis not present

## 2019-04-07 DIAGNOSIS — Z20828 Contact with and (suspected) exposure to other viral communicable diseases: Secondary | ICD-10-CM | POA: Diagnosis present

## 2019-04-07 DIAGNOSIS — R509 Fever, unspecified: Secondary | ICD-10-CM | POA: Diagnosis present

## 2019-04-07 DIAGNOSIS — E1142 Type 2 diabetes mellitus with diabetic polyneuropathy: Secondary | ICD-10-CM | POA: Diagnosis present

## 2019-04-07 DIAGNOSIS — Z7951 Long term (current) use of inhaled steroids: Secondary | ICD-10-CM

## 2019-04-07 DIAGNOSIS — T6709XA Other heatstroke and sunstroke, initial encounter: Secondary | ICD-10-CM | POA: Diagnosis present

## 2019-04-07 DIAGNOSIS — X58XXXA Exposure to other specified factors, initial encounter: Secondary | ICD-10-CM | POA: Diagnosis present

## 2019-04-07 DIAGNOSIS — Z880 Allergy status to penicillin: Secondary | ICD-10-CM

## 2019-04-07 DIAGNOSIS — Z8673 Personal history of transient ischemic attack (TIA), and cerebral infarction without residual deficits: Secondary | ICD-10-CM | POA: Diagnosis not present

## 2019-04-07 DIAGNOSIS — I10 Essential (primary) hypertension: Secondary | ICD-10-CM | POA: Diagnosis present

## 2019-04-07 DIAGNOSIS — M0600 Rheumatoid arthritis without rheumatoid factor, unspecified site: Secondary | ICD-10-CM | POA: Diagnosis present

## 2019-04-07 DIAGNOSIS — E782 Mixed hyperlipidemia: Secondary | ICD-10-CM | POA: Diagnosis present

## 2019-04-07 DIAGNOSIS — Z7982 Long term (current) use of aspirin: Secondary | ICD-10-CM | POA: Diagnosis not present

## 2019-04-07 DIAGNOSIS — Z87891 Personal history of nicotine dependence: Secondary | ICD-10-CM

## 2019-04-07 DIAGNOSIS — G4730 Sleep apnea, unspecified: Secondary | ICD-10-CM | POA: Diagnosis present

## 2019-04-07 DIAGNOSIS — I83813 Varicose veins of bilateral lower extremities with pain: Secondary | ICD-10-CM | POA: Diagnosis present

## 2019-04-07 DIAGNOSIS — Z7952 Long term (current) use of systemic steroids: Secondary | ICD-10-CM

## 2019-04-07 DIAGNOSIS — Z8249 Family history of ischemic heart disease and other diseases of the circulatory system: Secondary | ICD-10-CM

## 2019-04-07 DIAGNOSIS — M1711 Unilateral primary osteoarthritis, right knee: Secondary | ICD-10-CM | POA: Diagnosis present

## 2019-04-07 DIAGNOSIS — E6609 Other obesity due to excess calories: Secondary | ICD-10-CM | POA: Diagnosis present

## 2019-04-07 DIAGNOSIS — K219 Gastro-esophageal reflux disease without esophagitis: Secondary | ICD-10-CM | POA: Diagnosis present

## 2019-04-07 LAB — URINALYSIS, COMPLETE (UACMP) WITH MICROSCOPIC
Bacteria, UA: NONE SEEN
Bilirubin Urine: NEGATIVE
Glucose, UA: >=500 mg/dL — AB
Hgb urine dipstick: NEGATIVE
Ketones, ur: NEGATIVE mg/dL
Leukocytes,Ua: NEGATIVE
Nitrite: NEGATIVE
Protein, ur: NEGATIVE mg/dL
Specific Gravity, Urine: 1.018 (ref 1.005–1.030)
pH: 5 (ref 5.0–8.0)

## 2019-04-07 LAB — COMPREHENSIVE METABOLIC PANEL
ALT: 74 U/L — ABNORMAL HIGH (ref 0–44)
AST: 26 U/L (ref 15–41)
Albumin: 3.7 g/dL (ref 3.5–5.0)
Alkaline Phosphatase: 47 U/L (ref 38–126)
Anion gap: 10 (ref 5–15)
BUN: 24 mg/dL — ABNORMAL HIGH (ref 6–20)
CO2: 24 mmol/L (ref 22–32)
Calcium: 8.7 mg/dL — ABNORMAL LOW (ref 8.9–10.3)
Chloride: 101 mmol/L (ref 98–111)
Creatinine, Ser: 0.98 mg/dL (ref 0.61–1.24)
GFR calc Af Amer: >60 mL/min (ref >60)
GFR calc non Af Amer: >60 mL/min (ref >60)
Glucose, Bld: 151 mg/dL — ABNORMAL HIGH (ref 70–99)
Potassium: 4.2 mmol/L (ref 3.5–5.1)
Sodium: 135 mmol/L (ref 135–145)
Total Bilirubin: 0.6 mg/dL (ref 0.3–1.2)
Total Protein: 7 g/dL (ref 6.5–8.1)

## 2019-04-07 LAB — HEMOGLOBIN A1C
Hgb A1c MFr Bld: 8.2 % — ABNORMAL HIGH (ref 4.8–5.6)
Mean Plasma Glucose: 188.64 mg/dL

## 2019-04-07 LAB — CBC WITH DIFFERENTIAL/PLATELET
Abs Immature Granulocytes: 0.1 10*3/uL — ABNORMAL HIGH (ref 0.00–0.07)
Basophils Absolute: 0.1 10*3/uL (ref 0.0–0.1)
Basophils Relative: 1 %
Eosinophils Absolute: 0 10*3/uL (ref 0.0–0.5)
Eosinophils Relative: 0 %
HCT: 48.2 % (ref 39.0–52.0)
Hemoglobin: 15.4 g/dL (ref 13.0–17.0)
Immature Granulocytes: 1 %
Lymphocytes Relative: 5 %
Lymphs Abs: 0.7 10*3/uL (ref 0.7–4.0)
MCH: 27.6 pg (ref 26.0–34.0)
MCHC: 32 g/dL (ref 30.0–36.0)
MCV: 86.4 fL (ref 80.0–100.0)
Monocytes Absolute: 1.1 10*3/uL — ABNORMAL HIGH (ref 0.1–1.0)
Monocytes Relative: 9 %
Neutro Abs: 10.5 10*3/uL — ABNORMAL HIGH (ref 1.7–7.7)
Neutrophils Relative %: 84 %
Platelets: 216 10*3/uL (ref 150–400)
RBC: 5.58 MIL/uL (ref 4.22–5.81)
RDW: 13.8 % (ref 11.5–15.5)
WBC: 12.5 10*3/uL — ABNORMAL HIGH (ref 4.0–10.5)
nRBC: 0 % (ref 0.0–0.2)

## 2019-04-07 LAB — CREATININE, SERUM
Creatinine, Ser: 0.88 mg/dL (ref 0.61–1.24)
GFR calc Af Amer: >60 mL/min (ref >60)
GFR calc non Af Amer: >60 mL/min (ref >60)

## 2019-04-07 LAB — LACTIC ACID, PLASMA
Lactic Acid, Venous: 1 mmol/L (ref 0.5–1.9)
Lactic Acid, Venous: 0.9 mmol/L (ref 0.5–1.9)

## 2019-04-07 LAB — GLUCOSE, CAPILLARY
Glucose-Capillary: 175 mg/dL — ABNORMAL HIGH (ref 70–99)
Glucose-Capillary: 212 mg/dL — ABNORMAL HIGH (ref 70–99)
Glucose-Capillary: 234 mg/dL — ABNORMAL HIGH (ref 70–99)

## 2019-04-07 LAB — PROTIME-INR
INR: 1 (ref 0.8–1.2)
Prothrombin Time: 13.2 seconds (ref 11.4–15.2)

## 2019-04-07 LAB — SARS CORONAVIRUS 2 BY RT PCR (HOSPITAL ORDER, PERFORMED IN ~~LOC~~ HOSPITAL LAB): SARS Coronavirus 2: NEGATIVE

## 2019-04-07 MED ORDER — SODIUM CHLORIDE 0.9 % IV BOLUS: 1000.0000 mL | Freq: Once | INTRAVENOUS | Status: AC

## 2019-04-07 MED ORDER — ACETAMINOPHEN 650 MG RE SUPP: 650.0000 mg | Freq: Four times a day (QID) | RECTAL | Status: DC | PRN

## 2019-04-07 MED ORDER — SODIUM CHLORIDE 0.9 % IV BOLUS
1000.0000 mL | Freq: Once | INTRAVENOUS | Status: AC
  Administered 2019-04-07: 1000 mL via INTRAVENOUS

## 2019-04-07 MED ORDER — INSULIN GLARGINE 100 UNIT/ML ~~LOC~~ SOLN
90.0000 [IU] | Freq: Every day | SUBCUTANEOUS | Status: DC
  Administered 2019-04-07: 90 [IU] via SUBCUTANEOUS
  Filled 2019-04-07 (×2): qty 0.9

## 2019-04-07 MED ORDER — ACETAMINOPHEN 325 MG PO TABS: 650.0000 mg | ORAL_TABLET | Freq: Four times a day (QID) | ORAL | Status: DC | PRN

## 2019-04-07 MED ORDER — INSULIN GLARGINE 100 UNIT/ML ~~LOC~~ SOLN: 100.0000 [IU] | Freq: Every day | SUBCUTANEOUS | Status: DC

## 2019-04-07 MED ORDER — HYDROCORTISONE NA SUCCINATE PF 100 MG IJ SOLR
100.0000 mg | Freq: Once | INTRAMUSCULAR | Status: AC
  Administered 2019-04-07: 100 mg via INTRAVENOUS
  Filled 2019-04-07: qty 2

## 2019-04-07 MED ORDER — VANCOMYCIN HCL IN DEXTROSE 1-5 GM/200ML-% IV SOLN
1000.0000 mg | Freq: Two times a day (BID) | INTRAVENOUS | Status: DC
  Administered 2019-04-07 – 2019-04-08 (×2): 1000 mg via INTRAVENOUS
  Filled 2019-04-07 (×4): qty 200

## 2019-04-07 MED ORDER — POLYETHYLENE GLYCOL 3350 17 G PO PACK: 17.0000 g | PACK | Freq: Every day | ORAL | Status: DC | PRN

## 2019-04-07 MED ORDER — LACTATED RINGERS IV SOLN
INTRAVENOUS | Status: DC
  Administered 2019-04-07 – 2019-04-08 (×2): via INTRAVENOUS

## 2019-04-07 MED ORDER — SODIUM CHLORIDE 0.9 % IV SOLN
2.0000 g | Freq: Three times a day (TID) | INTRAVENOUS | Status: DC
  Administered 2019-04-07 – 2019-04-08 (×3): 2 g via INTRAVENOUS
  Filled 2019-04-07 (×6): qty 2

## 2019-04-07 MED ORDER — VANCOMYCIN HCL IN DEXTROSE 1-5 GM/200ML-% IV SOLN
1000.0000 mg | Freq: Once | INTRAVENOUS | Status: AC
  Administered 2019-04-07: 12:00:00 1000 mg via INTRAVENOUS
  Filled 2019-04-07: qty 200

## 2019-04-07 MED ORDER — ENOXAPARIN SODIUM 40 MG/0.4ML ~~LOC~~ SOLN
40.0000 mg | SUBCUTANEOUS | Status: DC
  Administered 2019-04-07: 40 mg via SUBCUTANEOUS
  Filled 2019-04-07: qty 0.4

## 2019-04-07 MED ORDER — SODIUM CHLORIDE 0.9 % IV SOLN
2.0000 g | Freq: Once | INTRAVENOUS | Status: AC
  Administered 2019-04-07: 2 g via INTRAVENOUS
  Filled 2019-04-07: qty 2

## 2019-04-07 MED ORDER — INSULIN ASPART 100 UNIT/ML ~~LOC~~ SOLN
0.0000 [IU] | Freq: Every day | SUBCUTANEOUS | Status: DC
  Administered 2019-04-07: 2 [IU] via SUBCUTANEOUS
  Filled 2019-04-07: qty 1

## 2019-04-07 MED ORDER — INSULIN ASPART 100 UNIT/ML ~~LOC~~ SOLN
0.0000 [IU] | Freq: Three times a day (TID) | SUBCUTANEOUS | Status: DC
  Administered 2019-04-07: 5 [IU] via SUBCUTANEOUS
  Administered 2019-04-07 – 2019-04-08 (×3): 3 [IU] via SUBCUTANEOUS
  Filled 2019-04-07 (×4): qty 1

## 2019-04-07 MED ORDER — ALBUTEROL SULFATE (2.5 MG/3ML) 0.083% IN NEBU: 2.5000 mg | INHALATION_SOLUTION | RESPIRATORY_TRACT | Status: DC | PRN

## 2019-04-07 MED ORDER — LISINOPRIL 20 MG PO TABS
40.0000 mg | ORAL_TABLET | Freq: Every day | ORAL | Status: DC
  Administered 2019-04-07 – 2019-04-08 (×2): 40 mg via ORAL
  Filled 2019-04-07 (×2): qty 2

## 2019-04-07 MED ORDER — PROCHLORPERAZINE EDISYLATE 10 MG/2ML IJ SOLN
10.0000 mg | Freq: Once | INTRAMUSCULAR | Status: AC
  Administered 2019-04-07: 10 mg via INTRAVENOUS
  Filled 2019-04-07: qty 2

## 2019-04-07 MED ORDER — IBUPROFEN 400 MG PO TABS
400.0000 mg | ORAL_TABLET | Freq: Four times a day (QID) | ORAL | Status: DC | PRN
  Administered 2019-04-07: 400 mg via ORAL
  Filled 2019-04-07: qty 1

## 2019-04-07 MED ORDER — VANCOMYCIN HCL 1.5 G IV SOLR
1500.0000 mg | Freq: Once | INTRAVENOUS | Status: AC
  Administered 2019-04-07: 1500 mg via INTRAVENOUS
  Filled 2019-04-07: qty 1500

## 2019-04-07 MED ORDER — ACETAMINOPHEN 500 MG PO TABS
1000.0000 mg | ORAL_TABLET | Freq: Once | ORAL | Status: AC
  Administered 2019-04-07: 1000 mg via ORAL
  Filled 2019-04-07: qty 2

## 2019-04-07 MED ORDER — DIPHENHYDRAMINE HCL 50 MG/ML IJ SOLN
25.0000 mg | Freq: Once | INTRAMUSCULAR | Status: AC
  Administered 2019-04-07: 25 mg via INTRAVENOUS
  Filled 2019-04-07: qty 1

## 2019-04-07 MED ORDER — ONDANSETRON HCL 4 MG/2ML IJ SOLN
4.0000 mg | Freq: Four times a day (QID) | INTRAMUSCULAR | Status: DC | PRN
  Administered 2019-04-08: 4 mg via INTRAVENOUS
  Filled 2019-04-07: qty 2

## 2019-04-07 MED ORDER — ONDANSETRON HCL 4 MG PO TABS: 4.0000 mg | ORAL_TABLET | Freq: Four times a day (QID) | ORAL | Status: DC | PRN

## 2019-04-07 NOTE — ED Notes (Signed)
ED TO INPATIENT HANDOFF REPORT  ED Nurse Name and Phone #: Ariel [phone]  S Name/Age/Gender Chase Harris 58 y.o. male Room/Bed: ED06A/ED06A  Code Status   Code Status: Full Code  Home/SNF/Other Home Patient oriented to: self, place, time and situation Is this baseline? Yes   Triage Complete: Triage complete  Chief Complaint FEVER  Triage Note Pt arrives via [facility]- pt worked outside [employer] and had a near syncopal episode- went to bed early last night after feeling weak- at around 10 he started having chills and body aches- nonproductive cough started this morning- pt c/o of headache, nausea, and diarrhea- tylenol 0912 1000 mg given by EMS   Allergies Allergies  Allergen Reactions  . Penicillins Rash    At birth  . Doxycycline     Unknown - pt informed pharmacist 02/17/2017.  [name] Dynabac [Dirithromycin] Nausea And Vomiting    Other reaction(s): Abdominal Pain    Level of Care/Admitting Diagnosis ED Disposition    ED Disposition Condition [facility] Area: [place] [[id number]]  Level of Care: Med-Surg [16]  Covid Evaluation: N/A  Diagnosis: Sepsis [facility]) [[id number]]  Admitting Physician: Hillary Bow [[id number]]  Attending Physician: Hillary Bow [[id number]]  Estimated length of stay: past midnight tomorrow  Certification:: I certify this patient will need inpatient services for at least 2 midnights  PT Class (Do Not Modify): Inpatient [101]  PT Acc Code (Do Not Modify): Private [1]       B Medical/Surgery History Past Medical History:  Diagnosis Date  . Arthritis   . Constipation   . Diabetes mellitus without complication (HCC)    Metformin  . Diabetic peripheral neuropathy ([place])   . GERD (gastroesophageal reflux disease)   . History of prostate cancer   . Hypertension   . Prostate cancer ([place])   . Sleep apnea   . Stroke [facility])    Past Surgical History:  Procedure Laterality Date  . COLONOSCOPY WITH PROPOFOL N/A  06/27/2018   Procedure: COLONOSCOPY WITH PROPOFOL;  Surgeon: Manya Silvas, MD;  Location: [facility] ENDOSCOPY;  Service: Endoscopy;  Laterality: N/A;  . ESOPHAGOGASTRODUODENOSCOPY (EGD) WITH PROPOFOL N/A 06/27/2018   Procedure: ESOPHAGOGASTRODUODENOSCOPY (EGD) WITH PROPOFOL;  Surgeon: Manya Silvas, MD;  Location: [facility] ENDOSCOPY;  Service: Endoscopy;  Laterality: N/A;  . HERNIA REPAIR    . PROSTATECTOMY    . TAYLOR BUNIONECTOMY    . WISDOM TOOTH EXTRACTION       A IV Location/Drains/Wounds Patient Lines/Drains/Airways Status   Active Line/Drains/Airways    Name:   Placement date:   Placement time:   Site:   Days:   Peripheral IV 04/07/19 Right Antecubital   04/07/19    0935    Antecubital   less than 1   Peripheral IV 04/07/19 Left Hand   04/07/19    0941    Hand   less than 1   Peripheral IV 04/07/19 Left Forearm   04/07/19    0945    Forearm   less than 1          Intake/Output Last 24 hours  Intake/Output Summary (Last 24 hours) at 04/07/2019 1206 Last data filed at 04/07/2019 1130 Gross per 24 hour  Intake 1000 ml  Output -  Net 1000 ml    Labs/Imaging Results for orders placed or performed during the hospital encounter of 04/07/19 (from the past 48 hour(s))  Comprehensive metabolic panel     Status: Abnormal   Collection Time: 04/07/19  9:42 AM  Result Value Ref Range   Sodium 135 135 - 145 mmol/L   Potassium 4.2 3.5 - 5.1 mmol/L   Chloride 101 98 - 111 mmol/L   CO2 24 22 - 32 mmol/L   Glucose, Bld 151 (H) 70 - 99 mg/dL   BUN 24 (H) 6 - 20 mg/dL   Creatinine, Ser 0.98 0.61 - 1.24 mg/dL   Calcium 8.7 (L) 8.9 - 10.3 mg/dL   Total Protein 7.0 6.5 - 8.1 g/dL   Albumin 3.7 3.5 - 5.0 g/dL   AST 26 15 - 41 U/L   ALT 74 (H) 0 - 44 U/L   Alkaline Phosphatase 47 38 - 126 U/L   Total Bilirubin 0.6 0.3 - 1.2 mg/dL   GFR calc non Af Amer >60 >60 mL/min   GFR calc Af Amer >60 >60 mL/min   Anion gap 10 5 - 15    Comment: Performed at [facility], [address] [place], [place] [address]  Lactic acid, plasma     Status: None   Collection Time: 04/07/19  9:42 AM  Result Value Ref Range   Lactic Acid, Venous 1.0 0.5 - 1.9 mmol/L    Comment: Performed at [facility], [address] [place], [place] [address]  CBC with Differential     Status: Abnormal   Collection Time: 04/07/19  9:42 AM  Result Value Ref Range   WBC 12.5 (H) 4.0 - 10.5 K/uL   RBC 5.58 4.22 - 5.81 MIL/uL   Hemoglobin 15.4 13.0 - 17.0 g/dL   HCT 48.2 39.0 - 52.0 %   MCV 86.4 80.0 - 100.0 fL   MCH 27.6 26.0 - 34.0 pg   MCHC 32.0 30.0 - 36.0 g/dL   RDW 13.8 11.5 - 15.5 %   Platelets 216 150 - 400 K/uL   nRBC 0.0 0.0 - 0.2 %   Neutrophils Relative % 84 %   Neutro Abs 10.5 (H) 1.7 - 7.7 K/uL   Lymphocytes Relative 5 %   Lymphs Abs 0.7 0.7 - 4.0 K/uL   Monocytes Relative 9 %   Monocytes Absolute 1.1 (H) 0.1 - 1.0 K/uL   Eosinophils Relative 0 %   Eosinophils Absolute 0.0 0.0 - 0.5 K/uL   Basophils Relative 1 %   Basophils Absolute 0.1 0.0 - 0.1 K/uL   Immature Granulocytes 1 %   Abs Immature Granulocytes 0.10 (H) 0.00 - 0.07 K/uL    Comment: Performed at [facility], [address] [place], [place] [address]  Protime-INR     Status: None   Collection Time: 04/07/19  9:42 AM  Result Value Ref Range   Prothrombin Time 13.2 11.4 - 15.2 seconds   INR 1.0 0.8 - 1.2    Comment: (NOTE) INR goal varies based on device and disease states. Performed at [facility], [address], [place], [place] [address]   SARS Coronavirus 2 (CEPHEID - Performed in [facility] hospital lab), Hosp Order     Status: None   Collection Time: 04/07/19  9:43 AM   Specimen: Nasopharyngeal Swab  Result Value Ref Range   SARS Coronavirus 2 NEGATIVE NEGATIVE    Comment: (NOTE) If result is NEGATIVE SARS-CoV-2 target nucleic acids are NOT DETECTED. The SARS-CoV-2 RNA is generally detectable in upper and lower  respiratory specimens during the acute phase of  infection. The lowest  concentration of SARS-CoV-2 viral copies this assay can detect is 250  copies / mL. A  negative result does not preclude SARS-CoV-2 infection  and should not be used as the sole basis for treatment or other  patient management decisions.  A negative result may occur with  improper specimen collection / handling, submission of specimen other  than nasopharyngeal swab, presence of viral mutation(s) within the  areas targeted by this assay, and inadequate number of viral copies  (<250 copies / mL). A negative result must be combined with clinical  observations, patient history, and epidemiological information. If result is POSITIVE SARS-CoV-2 target nucleic acids are DETECTED. The SARS-CoV-2 RNA is generally detectable in upper and lower  respiratory specimens dur ing the acute phase of infection.  Positive  results are indicative of active infection with SARS-CoV-2.  Clinical  correlation with patient history and other diagnostic information is  necessary to determine patient infection status.  Positive results do  not rule out bacterial infection or co-infection with other viruses. If result is PRESUMPTIVE POSTIVE SARS-CoV-2 nucleic acids MAY BE PRESENT.   A presumptive positive result was obtained on the submitted specimen  and confirmed on repeat testing.  While 2019 novel coronavirus  (SARS-CoV-2) nucleic acids may be present in the submitted sample  additional confirmatory testing may be necessary for epidemiological  and / or clinical management purposes  to differentiate between  SARS-CoV-2 and other Sarbecovirus currently known to infect humans.  If clinically indicated additional testing with an alternate test  methodology [phone]) is advised. The SARS-CoV-2 RNA is generally  detectable in upper and lower respiratory sp ecimens during the acute  phase of infection. The expected result is Negative. Fact Sheet for Patients:   StrictlyIdeas.no Fact Sheet for Healthcare Providers: BankingDealers.co.za This test is not yet approved or cleared by the [place] FDA and has been authorized for detection and/or diagnosis of SARS-CoV-2 by FDA under an Emergency Use Authorization (EUA).  This EUA will remain in effect (meaning this test can be used) for the duration of the COVID-19 declaration under Section 564(b)(1) of the Act, 21 U.S.C. section 360bbb-3(b)(1), unless the authorization is terminated or revoked sooner. Performed at [facility], [address] [place], [place] [address]   Urinalysis, Complete w Microscopic     Status: Abnormal   Collection Time: 04/07/19 11:31 AM  Result Value Ref Range   Color, Urine YELLOW (A) YELLOW   APPearance CLEAR (A) CLEAR   Specific Gravity, Urine 1.018 1.005 - 1.030   pH 5.0 5.0 - 8.0   Glucose, UA >=500 (A) NEGATIVE mg/dL   Hgb urine dipstick NEGATIVE NEGATIVE   Bilirubin Urine NEGATIVE NEGATIVE   Ketones, ur NEGATIVE NEGATIVE mg/dL   Protein, ur NEGATIVE NEGATIVE mg/dL   Nitrite NEGATIVE NEGATIVE   Leukocytes,Ua NEGATIVE NEGATIVE   WBC, UA 0-5 0 - 5 WBC/hpf   Bacteria, UA NONE SEEN NONE SEEN   Squamous Epithelial / LPF 0-5 0 - 5    Comment: Performed at [facility], [address], [place],  [address]   Dg Chest Portable 1 View  Result Date: 04/07/2019 CLINICAL DATA:  Former smoker (many years), pt worked outside [employer] and had a near syncopal episode- went to bed early last night after feeling weak- at around 10 he started having chills and body aches- nonproductive cough started this morning- pt c/o of headache, nausea, and diarrhea- tylenol 0912 1000 mg given by EMSPt denies hx of heart / lung disease. Hx of prostate Ca 10+yrs ago EXAM: PORTABLE CHEST 1 VIEW COMPARISON:  03/06/2013 FINDINGS: Cardiac silhouette  is normal in size. No mediastinal or hilar masses or evidence of  adenopathy. Clear lungs.  No pleural effusion or pneumothorax. Skeletal structures are grossly intact. IMPRESSION: No active disease. Electronically Signed   By: Lajean Manes M.D.   On: 04/07/2019 10:13    Pending Labs Unresulted Labs (From admission, onward)    Start     Ordered   04/14/19 0500  Creatinine, serum  (enoxaparin (LOVENOX)    CrCl >/= 30 ml/min)  Weekly,   STAT    Comments: while on enoxaparin therapy    04/07/19 1145   04/08/19 0500  Comprehensive metabolic panel  Tomorrow morning,   STAT     04/07/19 1145   04/08/19 0500  CBC  Tomorrow morning,   STAT     04/07/19 1145   04/07/19 1146  Hemoglobin A1c  Add-on,   AD    Comments: To assess prior glycemic control    04/07/19 1145   04/07/19 1145  HIV antibody (Routine Testing)  Add-on,   AD     04/07/19 1145   04/07/19 1144  Creatinine, serum  (enoxaparin (LOVENOX)    CrCl >/= 30 ml/min)  Once,   STAT    Comments: Baseline for enoxaparin therapy IF NOT ALREADY DRAWN.    04/07/19 1145   04/07/19 1136  Urine culture  ONCE - STAT,   STAT    Question:  Patient immune status  Answer:  Normal   04/07/19 1135   04/07/19 0940  Lactic acid, plasma  Now then every 2 hours,   STAT     04/07/19 0939   04/07/19 0940  Culture, blood (Routine x 2)  BLOOD CULTURE X 2,   STAT     04/07/19 0939          Vitals/Pain Today's Vitals   04/07/19 1000 04/07/19 1030 04/07/19 1100 04/07/19 1157  BP: 132/70 139/78 (!) 150/80 130/84  Pulse: (!) 115 (!) 106 97 100  Resp: 19 17 20 17   Temp:      TempSrc:      SpO2: 95% 94%  91%  Weight:      Height:      PainSc:        Isolation Precautions No active isolations  Medications Medications  ceFEPIme (MAXIPIME) 2 g in sodium chloride 0.9 % 100 mL IVPB (2 g Intravenous New Bag/Given 04/07/19 1140)  vancomycin (VANCOCIN) IVPB 1000 mg/200 mL premix (1,000 mg Intravenous New Bag/Given 04/07/19 1143)  vancomycin (VANCOCIN) 1,500 mg in sodium chloride 0.9 % 500 mL IVPB (has no  administration in time range)  sodium chloride 0.9 % bolus 1,000 mL (has no administration in time range)  enoxaparin (LOVENOX) injection 40 mg (has no administration in time range)  acetaminophen (TYLENOL) tablet 650 mg (has no administration in time range)    Or  acetaminophen (TYLENOL) suppository 650 mg (has no administration in time range)  ibuprofen (ADVIL) tablet 400 mg (has no administration in time range)  polyethylene glycol (MIRALAX / GLYCOLAX) packet 17 g (has no administration in time range)  ondansetron (ZOFRAN) tablet 4 mg (has no administration in time range)    Or  ondansetron (ZOFRAN) injection 4 mg (has no administration in time range)  albuterol (PROVENTIL) (2.5 MG/3ML) 0.083% nebulizer solution 2.5 mg (has no administration in time range)  insulin aspart (novoLOG) injection 0-15 Units (has no administration in time range)  insulin aspart (novoLOG) injection 0-5 Units (has no administration in time range)  lactated ringers infusion (has  no administration in time range)  sodium chloride 0.9 % bolus 1,000 mL (0 mLs Intravenous Stopped 04/07/19 1130)  acetaminophen (TYLENOL) tablet 1,000 mg (1,000 mg Oral Given 04/07/19 1024)  prochlorperazine (COMPAZINE) injection 10 mg (10 mg Intravenous Given 04/07/19 1029)  diphenhydrAMINE (BENADRYL) injection 25 mg (25 mg Intravenous Given 04/07/19 1027)  hydrocortisone sodium succinate (SOLU-CORTEF) 100 MG injection 100 mg (100 mg Intravenous Given 04/07/19 1128)    Mobility walks Low fall risk   Focused Assessments    R Recommendations: See Admitting Provider Note  Report given to:   Additional Notes:

## 2019-04-07 NOTE — ED Notes (Signed)
Pt wife Therapist, sports at [facility]

## 2019-04-07 NOTE — ED Notes (Signed)
X-ray at bedside

## 2019-04-07 NOTE — ED Notes (Signed)
Pt up to use bathroom at this time. 

## 2019-04-07 NOTE — H&P (Signed)
[place] [facility] [place] NAME: Chase Harris    MR#:  [id number]  DATE OF BIRTH:  26-Sep-1961  DATE OF ADMISSION:  04/07/2019  PRIMARY CARE PHYSICIAN: Dion Body, MD   REQUESTING/REFERRING PHYSICIAN: Dr. Ellender Hose  CHIEF COMPLAINT:   Chief Complaint  Patient presents with  . Fever    HISTORY OF PRESENT ILLNESS:  Chase Harris  is a 58 y.o. male with a known history of hypertension, diabetes mellitus, rheumatoid arthritis on Humira presents to the emergency room complaining of weakness, lightheadedness and fever.  Patient worked out in the hot sun yesterday from 9 AM to 4 PM.  After this he felt dizzy almost like he was passing out.  Also fever.  Today he continued to have fevers in the morning and presented to ED.  Here he has a chest x-ray which is clear.  No source of infection found.  Due to fever, tachycardia patient was given IV antibiotics and is being admitted to the hospital.  He has had decreased dark urine.  PAST MEDICAL HISTORY:   Past Medical History:  Diagnosis Date  . Arthritis   . Constipation   . Diabetes mellitus without complication (HCC)    Metformin  . Diabetic peripheral neuropathy ([place])   . GERD (gastroesophageal reflux disease)   . History of prostate cancer   . Hypertension   . Prostate cancer ([place])   . Sleep apnea   . Stroke [facility])     PAST SURGICAL HISTORY:   Past Surgical History:  Procedure Laterality Date  . COLONOSCOPY WITH PROPOFOL N/A 06/27/2018   Procedure: COLONOSCOPY WITH PROPOFOL;  Surgeon: Manya Silvas, MD;  Location: [facility] ENDOSCOPY;  Service: Endoscopy;  Laterality: N/A;  . ESOPHAGOGASTRODUODENOSCOPY (EGD) WITH PROPOFOL N/A 06/27/2018   Procedure: ESOPHAGOGASTRODUODENOSCOPY (EGD) WITH PROPOFOL;  Surgeon: Manya Silvas, MD;  Location: [facility] ENDOSCOPY;  Service: Endoscopy;  Laterality: N/A;  . HERNIA REPAIR    . PROSTATECTOMY    . TAYLOR BUNIONECTOMY    . WISDOM TOOTH EXTRACTION       SOCIAL HISTORY:   Social History   Tobacco Use  . Smoking status: Former Research scientist (life sciences)  . Smokeless tobacco: Never Used  Substance Use Topics  . Alcohol use: Yes    Alcohol/week: 4.0 standard drinks    Types: 2 Cans of beer, 2 Shots of liquor per week    FAMILY HISTORY:   Family History  Problem Relation Age of Onset  . Congestive Heart Failure Father   . Hypertension Father   . Kidney disease Father   . Diabetes Paternal Grandmother   . Diabetes Paternal Aunt   . Hypothyroidism Mother     DRUG ALLERGIES:   Allergies  Allergen Reactions  . Penicillins Rash    At birth  . Doxycycline     Unknown - pt informed pharmacist 02/17/2017.  [name] Dynabac [Dirithromycin] Nausea And Vomiting    Other reaction(s): Abdominal Pain    REVIEW OF SYSTEMS:   Review of Systems  Constitutional: Positive for chills, fever and malaise/fatigue.  HENT: Negative for sore throat.   Eyes: Negative for blurred vision, double vision and pain.  Respiratory: Negative for cough, hemoptysis, shortness of breath and wheezing.   Cardiovascular: Negative for chest pain, palpitations, orthopnea and leg swelling.  Gastrointestinal: Negative for abdominal pain, constipation, diarrhea, heartburn, nausea and vomiting.  Genitourinary: Negative for dysuria and hematuria.  Musculoskeletal: Negative for back pain and joint pain.  Skin: Negative for rash.  Neurological:  Positive for dizziness. Negative for sensory change, speech change, focal weakness and headaches.  Endo/Heme/Allergies: Does not bruise/bleed easily.  Psychiatric/Behavioral: Negative for depression. The patient is not nervous/anxious.     MEDICATIONS AT HOME:   Prior to Admission medications   Medication Sig Start Date End Date Taking? Authorizing Provider  albuterol (PROVENTIL HFA) 108 (90 Base) MCG/ACT inhaler Inhale into the lungs. 06/01/17 06/01/18  [provider]  aspirin EC 81 MG tablet Take by mouth.    [provider]   canagliflozin (INVOKANA) 300 MG TABS tablet Take by mouth. 05/27/15   [provider]  collagenase (SANTYL) ointment Apply 1 application topically daily. Patient not taking: Reported on 10/17/2017 01/13/17   Trula Slade, DPM  ezetimibe (ZETIA) 10 MG tablet Take 10 mg by mouth daily.    [provider]  fluticasone (FLONASE) 50 MCG/ACT nasal spray Place into the nose. 03/09/16   [provider]  FOLIC ACID PO Take by mouth.    [provider]  gabapentin (NEURONTIN) 300 MG capsule TAKE  1 CAPSULE BY MOUTH EVERY MORNING AND 2 CAPSULES IN THE EVENING 03/01/19   Trula Slade, DPM  glyBURIDE (DIABETA) 5 MG tablet  08/01/16   [provider]  hydrochlorothiazide (HYDRODIURIL) 25 MG tablet  09/11/17   [provider]  HYDROcodone-acetaminophen (NORCO/VICODIN) 5-325 MG tablet Take 1 tablet by mouth every 6 (six) hours as needed. Patient not taking: Reported on 10/17/2017 05/11/17   Trula Slade, DPM  hydroxychloroquine (PLAQUENIL) 200 MG tablet Take by mouth daily.    [provider]  insulin aspart (NOVOLOG) 100 UNIT/ML FlexPen Inject into the skin. 03/02/16   [provider]  insulin aspart protamine - aspart (NOVOLOG 70/30 MIX) (70-30) 100 UNIT/ML FlexPen Inject into the skin.    [provider]  Insulin Detemir (LEVEMIR FLEXTOUCH) 100 UNIT/ML Pen Levemir FlexTouch U-100 Insulin 100 unit/mL (3 mL) subcutaneous pen    [provider]  Insulin Pen Needle (B-D ULTRAFINE III SHORT PEN) 31G X 8 MM MISC USE THREE TIMES A DAY 04/01/16   [provider]  lisinopril (PRINIVIL,ZESTRIL) 40 MG tablet  09/11/17   [provider]  lisinopril-hydrochlorothiazide (PRINZIDE,ZESTORETIC) 10-12.5 MG tablet Take by mouth. 03/16/16   [provider]  loratadine (CLARITIN) 10 MG tablet Take by mouth.    [provider]  lovastatin (MEVACOR) 20 MG tablet  09/20/17   [provider]   meloxicam (MOBIC) 15 MG tablet Take 1 tablet (15 mg total) by mouth daily. 03/01/19   Trula Slade, DPM  metFORMIN (GLUCOPHAGE) 1000 MG tablet TAKE 1 TABLET IN  THE MORNING AND TAKE 1 AND 1/2 TABLET IN THE EVENING 12/01/15   [provider]  omeprazole (PRILOSEC) 20 MG capsule Take 20 mg by mouth daily.    [provider]  PREDNISONE PO Take by mouth.    [provider]  ranitidine (ZANTAC) 150 MG tablet Take by mouth.    [provider]  traMADol (ULTRAM) 50 MG tablet Take 1 tablet (50 mg total) by mouth every 12 (twelve) hours as needed. Patient not taking: Reported on 10/17/2017 06/05/17   Trula Slade, DPM  TRESIBA FLEXTOUCH 200 UNIT/ML SOPN Pt stated, "Takes 90 units at night time" 05/17/17   [provider]     VITAL SIGNS:  Blood pressure 131/69, pulse (!) 102, temperature (!) 100.7 F (38.2 C), temperature source Oral, resp. rate 19, height 5\' 8"  (1.727 m), weight 111.1 kg,  SpO2 93 %.  PHYSICAL EXAMINATION:  Physical Exam  GENERAL:  58 y.o.-year-old patient lying in the bed with no acute distress.  Obese EYES: Pupils equal, round, reactive to light and accommodation. No scleral icterus. Extraocular muscles intact.  HEENT: Head atraumatic, normocephalic. Oropharynx and nasopharynx clear. No oropharyngeal erythema, dry oral mucosa  NECK:  Supple, no jugular venous distention. No thyroid enlargement, no tenderness.  LUNGS: Normal breath sounds bilaterally, no wheezing, rales, rhonchi. No use of accessory muscles of respiration.  CARDIOVASCULAR: S1, S2 .  Tachycardia ABDOMEN: Soft, nontender, nondistended. Bowel sounds present. No organomegaly or mass.  EXTREMITIES: No pedal edema, cyanosis, or clubbing. + 2 pedal & radial pulses b/l.   NEUROLOGIC: Cranial nerves II through XII are intact. No focal Motor or sensory deficits appreciated b/l PSYCHIATRIC: The patient is alert and oriented x 3. Good affect.  SKIN: No obvious rash, lesion,  or ulcer.   LABORATORY PANEL:   CBC Recent Labs  Lab 04/07/19 0942  WBC 12.5*  HGB 15.4  HCT 48.2  PLT 216   ------------------------------------------------------------------------------------------------------------------  Chemistries  Recent Labs  Lab 04/07/19 0942  NA 135  K 4.2  CL 101  CO2 24  GLUCOSE 151*  BUN 24*  CREATININE 0.98  CALCIUM 8.7*  AST 26  ALT 74*  ALKPHOS 47  BILITOT 0.6   ------------------------------------------------------------------------------------------------------------------  Cardiac Enzymes No results for input(s): TROPONINI in the last 168 hours. ------------------------------------------------------------------------------------------------------------------  RADIOLOGY:  Dg Chest Portable 1 View  Result Date: 04/07/2019 CLINICAL DATA:  Former smoker (many years), pt worked outside [employer] and had a near syncopal episode- went to bed early last night after feeling weak- at around 10 he started having chills and body aches- nonproductive cough started this morning- pt c/o of headache, nausea, and diarrhea- tylenol 0912 1000 mg given by EMSPt denies hx of heart / lung disease. Hx of prostate Ca 10+yrs ago EXAM: PORTABLE CHEST 1 VIEW COMPARISON:  03/06/2013 FINDINGS: Cardiac silhouette is normal in size. No mediastinal or hilar masses or evidence of adenopathy. Clear lungs.  No pleural effusion or pneumothorax. Skeletal structures are grossly intact. IMPRESSION: No active disease. Electronically Signed   By: Lajean Manes M.D.   On: 04/07/2019 10:13     IMPRESSION AND PLAN:   *Sepsis with unknown etiology We will continue broad-spectrum antibiotics while cultures are pending.  Urinalysis is pending.  His fevers, dizziness and tachycardia could be due to heatstroke.  Will resuscitate with fluids.  If cultures remain negative and patient is afebrile by tomorrow can stop IV antibiotics. Check procalcitonin level  *Diabetes mellitus.   Sliding scale insulin ordered.  Home medications.  Diabetic diet  *Hypertension.  Continue home medications  *Rheumatoid arthritis.  DVT prophylaxis with Lovenox  All the records are reviewed and case discussed with ED provider. Management plans discussed with the patient, family and they are in agreement.  CODE STATUS: Full code  TOTAL TIME TAKING CARE OF THIS PATIENT: 40 minutes.   Neita Carp M.D on 04/07/2019 at 12:32 PM  Between 7am to 6pm - Pager - [phone]  After 6pm go to [email or web address] - password [email or web address] [facility]  Office  [phone]  CC: Primary care physician; Dion Body, MD  Note: This dictation was prepared with Dragon dictation along with smaller phrase technology. Any transcriptional errors that result from this process are unintentional.

## 2019-04-07 NOTE — ED Notes (Signed)
Admitting MD at bedside.

## 2019-04-07 NOTE — ED Notes (Signed)
EDP at bedside  

## 2019-04-07 NOTE — Consult Note (Signed)
PHARMACY -  BRIEF ANTIBIOTIC NOTE   Pharmacy has received consult(s) for Sepsis from an ED provider.  The patient's profile has been reviewed for ht/wt/allergies/indication/available labs.    One time order(s) placed for cefepime and vancomycin  Further antibiotics/pharmacy consults should be ordered by admitting physician if indicated.                       Thank you, Oswald Hillock 04/07/2019  11:22 AM

## 2019-04-07 NOTE — ED Notes (Signed)
Report given to Molly, RN

## 2019-04-07 NOTE — ED Provider Notes (Signed)
[facility] Emergency Department Provider Note  ____________________________________________   First MD Initiated Contact with Patient 04/07/19 [phone]     (approximate)  I have reviewed the triage vital signs and the nursing notes.   HISTORY  Chief Complaint Fever    HPI Chase Harris is a 58 y.o. male  With h/o proastate CA, DM, GERD, obesity here with fever, fatigue. Pt states he felt fine yesterday, spent the entire day outside doing yardwork. Towards the end of the day, he felt very fatigued and when walking inside, had an episode where he felt very lightheaded and felt like he was going to pass out.  Since then, the patient has had fevers, chills, and severe Reiger's.  He states that he is developed a mild cough, nausea.  He states he slept 11 to 12 hours overnight, which is very typical for him.  Denies any shortness of breath.  Denies any persistent abdominal pain.  This morning, he had persistent fever, and chills that he could not stop.  He subsequent presents evaluation.  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  No recent travel.  No rash.  He has a moderate, aching, throbbing, frontal headache which is new for him as well.  No neck pain or neck stiffness.  No photophobia or phonophobia.       Past Medical History:  Diagnosis Date  . Arthritis   . Constipation   . Diabetes mellitus without complication (HCC)    Metformin  . Diabetic peripheral neuropathy ([place])   . GERD (gastroesophageal reflux disease)   . History of prostate cancer   . Hypertension   . Prostate cancer ([place])   . Sleep apnea   . Stroke [facility])     Patient Active Problem List   Diagnosis Date Noted  . Sepsis ([place]) 04/07/2019  . Diabetic retinopathy associated with type 2 diabetes mellitus ([place]) 03/01/2019  . Localized osteoarthritis of right knee 05/22/2018  . Chronic pain of right knee  01/22/2018  . Varicose veins of leg with pain, bilateral 10/17/2017  . Painful orthopaedic hardware ([place]) 04/17/2017  . DM type 2 with diabetic peripheral neuropathy ([place]) 02/13/2017  . Achilles tendinitis of left lower extremity 09/22/2016  . Vaccine counseling 09/09/2016  . Peripheral arterial disease ([place]) 08/23/2016  . Essential hypertension with goal blood pressure less than 140/90 08/22/2016  . History of prostate cancer 08/22/2016  . IDDM (insulin dependent diabetes mellitus) ([place]) 08/22/2016  . Mixed hyperlipidemia 08/22/2016  . Non morbid obesity due to excess calories 08/22/2016  . Sleep apnea 08/22/2016  . Trigger little finger of right hand 06/13/2016  . Encounter for long-term (current) use of high-risk medication 04/04/2016  . Bilateral hand pain 03/21/2016  . Inflammatory arthritis 03/21/2016  . Type 2 diabetes mellitus ([place]) 03/21/2016  . Seronegative rheumatoid arthritis ([place]) 03/21/2016  . Headache 03/17/2015    Past Surgical History:  Procedure Laterality Date  . COLONOSCOPY WITH PROPOFOL N/A 06/27/2018   Procedure: COLONOSCOPY WITH PROPOFOL;  Surgeon: Manya Silvas, MD;  Location: [facility] ENDOSCOPY;  Service: Endoscopy;  Laterality: N/A;  . ESOPHAGOGASTRODUODENOSCOPY (EGD) WITH PROPOFOL N/A 06/27/2018   Procedure: ESOPHAGOGASTRODUODENOSCOPY (EGD) WITH PROPOFOL;  Surgeon: Manya Silvas, MD;  Location: [facility] ENDOSCOPY;  Service: Endoscopy;  Laterality: N/A;  . HERNIA REPAIR    . PROSTATECTOMY    . TAYLOR BUNIONECTOMY    . WISDOM TOOTH EXTRACTION  Prior to Admission medications   Medication Sig Start Date End Date Taking? Authorizing Provider  albuterol (PROVENTIL HFA) 108 (90 Base) MCG/ACT inhaler Inhale into the lungs. 06/01/17 06/01/18  [provider]  aspirin EC 81 MG tablet Take by mouth.    [provider]  canagliflozin (INVOKANA) 300 MG TABS tablet Take by mouth. 05/27/15   [provider]  collagenase (SANTYL) ointment  Apply 1 application topically daily. Patient not taking: Reported on 10/17/2017 01/13/17   Trula Slade, DPM  ezetimibe (ZETIA) 10 MG tablet Take 10 mg by mouth daily.    [provider]  fluticasone (FLONASE) 50 MCG/ACT nasal spray Place into the nose. 03/09/16   [provider]  FOLIC ACID PO Take by mouth.    [provider]  gabapentin (NEURONTIN) 300 MG capsule TAKE  1 CAPSULE BY MOUTH EVERY MORNING AND 2 CAPSULES IN THE EVENING 03/01/19   Trula Slade, DPM  glyBURIDE (DIABETA) 5 MG tablet  08/01/16   [provider]  hydrochlorothiazide (HYDRODIURIL) 25 MG tablet  09/11/17   [provider]  HYDROcodone-acetaminophen (NORCO/VICODIN) 5-325 MG tablet Take 1 tablet by mouth every 6 (six) hours as needed. Patient not taking: Reported on 10/17/2017 05/11/17   Trula Slade, DPM  hydroxychloroquine (PLAQUENIL) 200 MG tablet Take by mouth daily.    [provider]  insulin aspart (NOVOLOG) 100 UNIT/ML FlexPen Inject into the skin. 03/02/16   [provider]  insulin aspart protamine - aspart (NOVOLOG 70/30 MIX) (70-30) 100 UNIT/ML FlexPen Inject into the skin.    [provider]  Insulin Detemir (LEVEMIR FLEXTOUCH) 100 UNIT/ML Pen Levemir FlexTouch U-100 Insulin 100 unit/mL (3 mL) subcutaneous pen    [provider]  Insulin Pen Needle (B-D ULTRAFINE III SHORT PEN) 31G X 8 MM MISC USE THREE TIMES A DAY 04/01/16   [provider]  lisinopril (PRINIVIL,ZESTRIL) 40 MG tablet  09/11/17   [provider]  lisinopril-hydrochlorothiazide (PRINZIDE,ZESTORETIC) 10-12.5 MG tablet Take by mouth. 03/16/16   [provider]  loratadine (CLARITIN) 10 MG tablet Take by mouth.    [provider]  lovastatin (MEVACOR) 20 MG tablet  09/20/17   [provider]  meloxicam (MOBIC) 15 MG tablet Take 1 tablet (15 mg total) by mouth daily. 03/01/19   Trula Slade, DPM  metFORMIN  (GLUCOPHAGE) 1000 MG tablet TAKE 1 TABLET IN  THE MORNING AND TAKE 1 AND 1/2 TABLET IN THE EVENING 12/01/15   [provider]  omeprazole (PRILOSEC) 20 MG capsule Take 20 mg by mouth daily.    [provider]  PREDNISONE PO Take by mouth.    [provider]  ranitidine (ZANTAC) 150 MG tablet Take by mouth.    [provider]  traMADol (ULTRAM) 50 MG tablet Take 1 tablet (50 mg total) by mouth every 12 (twelve) hours as needed. Patient not taking: Reported on 10/17/2017 06/05/17   Trula Slade, DPM  TRESIBA FLEXTOUCH 200 UNIT/ML SOPN Pt stated, "Takes 90 units at night time" 05/17/17   [provider]    Allergies Penicillins, Doxycycline, and Dynabac [dirithromycin]  Family History  Problem Relation Age of Onset  . Congestive Heart Failure Father   . Hypertension Father   . Kidney disease Father   . Diabetes Paternal Grandmother   . Diabetes Paternal Aunt   . Hypothyroidism Mother     Social History Social History   Tobacco Use  . Smoking status: Former Research scientist (life sciences)  .  Smokeless tobacco: Never Used  Substance Use Topics  . Alcohol use: Yes    Alcohol/week: 4.0 standard drinks    Types: 2 Cans of beer, 2 Shots of liquor per week  . Drug use: No    Review of Systems  Review of Systems  Constitutional: Positive for chills, fatigue and fever.  HENT: Negative for sore throat.   Respiratory: Positive for cough. Negative for shortness of breath.   Cardiovascular: Negative for chest pain.  Gastrointestinal: Positive for nausea. Negative for abdominal pain.  Genitourinary: Negative for flank pain.  Musculoskeletal: Negative for neck pain.  Skin: Negative for rash and wound.  Allergic/Immunologic: Negative for immunocompromised state.  Neurological: Positive for weakness. Negative for numbness.  Hematological: Does not bruise/bleed easily.  All other systems reviewed and are negative.    ____________________________________________   PHYSICAL EXAM:      VITAL SIGNS: ED Triage Vitals  Enc Vitals Group     BP 04/07/19 0936 (!) 167/79     Pulse Rate 04/07/19 0936 (!) 120     Resp 04/07/19 0936 20     Temp 04/07/19 0936 (!) 100.7 F (38.2 C)     Temp Source 04/07/19 0936 Oral     SpO2 04/07/19 0934 99 %     Weight 04/07/19 0937 245 lb (111.1 kg)     Height 04/07/19 0937 5\' 8"  (1.727 m)     Head Circumference --      Peak Flow --      Pain Score 04/07/19 0937 7     Pain Loc --      Pain Edu? --      Excl. in [place]? --      Physical Exam Vitals signs and nursing note reviewed.  Constitutional:      General: He is not in acute distress.    Appearance: He is well-developed.  HENT:     Head: Normocephalic and atraumatic.     Mouth/Throat:     Mouth: Mucous membranes are dry.  Eyes:     Conjunctiva/sclera: Conjunctivae normal.  Neck:     Musculoskeletal: Neck supple.  Cardiovascular:     Rate and Rhythm: Regular rhythm. Tachycardia present.     Heart sounds: Normal heart sounds. No murmur. No friction rub.  Pulmonary:     Effort: Pulmonary effort is normal. No respiratory distress.     Breath sounds: Normal breath sounds. No wheezing or rales.  Abdominal:     General: Abdomen is flat. There is no distension.     Palpations: Abdomen is soft.     Tenderness: There is no abdominal tenderness.  Skin:    General: Skin is warm.     Capillary Refill: Capillary refill takes less than 2 seconds.  Neurological:     Mental Status: He is alert and oriented to person, place, and time.     Motor: No abnormal muscle tone.       ____________________________________________   LABS (all labs ordered are listed, but only abnormal results are displayed)  Labs Reviewed  COMPREHENSIVE METABOLIC PANEL - Abnormal; Notable for the following components:      Result Value   Glucose, Bld 151 (*)    BUN 24 (*)    Calcium 8.7 (*)    ALT 74 (*)    All other components within normal limits  CBC WITH DIFFERENTIAL/PLATELET  - Abnormal; Notable for the following components:   WBC 12.5 (*)    Neutro Abs 10.5 (*)  Monocytes Absolute 1.1 (*)    Abs Immature Granulocytes 0.10 (*)    All other components within normal limits  URINALYSIS, COMPLETE (UACMP) WITH MICROSCOPIC - Abnormal; Notable for the following components:   Color, Urine YELLOW (*)    APPearance CLEAR (*)    Glucose, UA >=500 (*)    All other components within normal limits  GLUCOSE, CAPILLARY - Abnormal; Notable for the following components:   Glucose-Capillary 175 (*)    All other components within normal limits  SARS CORONAVIRUS 2 (HOSPITAL ORDER, [facility])  CULTURE, BLOOD (ROUTINE X 2)  CULTURE, BLOOD (ROUTINE X 2)  URINE CULTURE  LACTIC ACID, PLASMA  LACTIC ACID, PLASMA  PROTIME-INR  CREATININE, SERUM  HIV ANTIBODY (ROUTINE TESTING W REFLEX)  HEMOGLOBIN A1C    ____________________________________________  EKG: Sinus tachycardia, ventricular rate 118.  PR 139, QRS 137, QTc 473.  No evidence of acute ischemia or infarct. ________________________________________  RADIOLOGY All imaging, including plain films, CT scans, and ultrasounds, independently reviewed by me, and interpretations confirmed via formal radiology reads.  ED MD interpretation:   Chest x-ray: No acute pneumonia  Official radiology report(s): Dg Chest Portable 1 View  Result Date: 04/07/2019 CLINICAL DATA:  Former smoker (many years), pt worked outside [employer] and had a near syncopal episode- went to bed early last night after feeling weak- at around 10 he started having chills and body aches- nonproductive cough started this morning- pt c/o of headache, nausea, and diarrhea- tylenol 0912 1000 mg given by EMSPt denies hx of heart / lung disease. Hx of prostate Ca 10+yrs ago EXAM: PORTABLE CHEST 1 VIEW COMPARISON:  03/06/2013 FINDINGS: Cardiac silhouette is normal in size. No mediastinal or hilar masses or evidence of adenopathy. Clear  lungs.  No pleural effusion or pneumothorax. Skeletal structures are grossly intact. IMPRESSION: No active disease. Electronically Signed   By: Lajean Manes M.D.   On: 04/07/2019 10:13    ____________________________________________  PROCEDURES   Procedure(s) performed (including Critical Care):  .Critical Care Performed by: Duffy Bruce, MD Authorized by: Duffy Bruce, MD   Critical care provider statement:    Critical care time (minutes):  35   Critical care time was exclusive of:  Separately billable procedures and treating other patients and teaching time   Critical care was necessary to treat or prevent imminent or life-threatening deterioration of the following conditions:  Sepsis   Critical care was time spent personally by me on the following activities:  Development of treatment plan with patient or surrogate, discussions with consultants, evaluation of patient's response to treatment, examination of patient, obtaining history from patient or surrogate, ordering and performing treatments and interventions, ordering and review of laboratory studies, ordering and review of radiographic studies, pulse oximetry, re-evaluation of patient's condition and review of old charts   I assumed direction of critical care for this patient from another provider in my specialty: no      ____________________________________________  INITIAL IMPRESSION / MDM / [place] / ED COURSE  As part of my medical decision making, I reviewed the following data within the electronic MEDICAL RECORD NUMBER Notes from prior ED visits and [place] Controlled Substance Database      *[place] Stuard Cofer was evaluated in Emergency Department on 04/07/2019 for the symptoms described in the history of present illness. He was evaluated in the context of the global COVID-19 pandemic, which necessitated consideration that the patient might be at risk for infection with the SARS-CoV-2 virus  that causes COVID-19.  Institutional protocols and algorithms that pertain to the evaluation of patients at risk for COVID-19 are in a state of rapid change based on information released by regulatory bodies including the CDC and federal and state organizations. These policies and algorithms were followed during the patient's care in the ED.  Some ED evaluations and interventions may be delayed as a result of limited staffing during the pandemic.*   Clinical Course as of Apr 06 1429  Sun Apr 06, 4822  5467 58 year old male here with fever and generalized weakness.  I suspect his weakness is a combination of fever and dehydration given that he was outside all day yesterday.  I suspect this likely contributed to his near syncopal episode as well.  EKG is nonischemic with no signs of arrhythmia.  Chest x-ray is actually clear.  Differential includes coronavirus, other viral illness, occult pneumonia, less likely intra-abdominal infection.  Will follow-up labs and coronavirus with further disposition as per results.  Fortunately, lactic acid is normal and he appears otherwise nontoxic.  IV fluids given.   [CI]  1112 Coronavirus negative.  Lactate is normal.  However, patient is on chronic prednisone and Humira for his rheumatoid arthritis.  Will cover empirically, get stress of steroids, and plan for admission for fever in immunosuppressed patient.   [CI]    Clinical Course User Index [CI] Duffy Bruce, MD    Medical Decision Making: As above.  Fever in immunosuppressed patient.  Broad-spectrum antibiotics, fluids, admit.  ____________________________________________  FINAL CLINICAL IMPRESSION(S) / ED DIAGNOSES  Final diagnoses:  Fever in adult  Sepsis without acute organ dysfunction, due to unspecified organism ([place])     MEDICATIONS GIVEN DURING THIS VISIT:  Medications  vancomycin (VANCOCIN) 1,500 mg in sodium chloride 0.9 % 500 mL IVPB (has no administration in time range)  enoxaparin (LOVENOX) injection 40  mg (has no administration in time range)  acetaminophen (TYLENOL) tablet 650 mg (has no administration in time range)    Or  acetaminophen (TYLENOL) suppository 650 mg (has no administration in time range)  ibuprofen (ADVIL) tablet 400 mg (has no administration in time range)  polyethylene glycol (MIRALAX / GLYCOLAX) packet 17 g (has no administration in time range)  ondansetron (ZOFRAN) tablet 4 mg (has no administration in time range)    Or  ondansetron (ZOFRAN) injection 4 mg (has no administration in time range)  albuterol (PROVENTIL) (2.5 MG/3ML) 0.083% nebulizer solution 2.5 mg (has no administration in time range)  insulin aspart (novoLOG) injection 0-15 Units (3 Units Subcutaneous Given 04/07/19 1327)  insulin aspart (novoLOG) injection 0-5 Units (has no administration in time range)  lactated ringers infusion ( Intravenous New Bag/Given 04/07/19 1326)  vancomycin (VANCOCIN) IVPB 1000 mg/200 mL premix (has no administration in time range)  ceFEPIme (MAXIPIME) 2 g in sodium chloride 0.9 % 100 mL IVPB (has no administration in time range)  insulin glargine (LANTUS) injection 90 Units (has no administration in time range)  lisinopril (ZESTRIL) tablet 40 mg (has no administration in time range)  sodium chloride 0.9 % bolus 1,000 mL (0 mLs Intravenous Stopped 04/07/19 1130)  acetaminophen (TYLENOL) tablet 1,000 mg (1,000 mg Oral Given 04/07/19 1024)  prochlorperazine (COMPAZINE) injection 10 mg (10 mg Intravenous Given 04/07/19 1029)  diphenhydrAMINE (BENADRYL) injection 25 mg (25 mg Intravenous Given 04/07/19 1027)  hydrocortisone sodium succinate (SOLU-CORTEF) 100 MG injection 100 mg (100 mg Intravenous Given 04/07/19 1128)  ceFEPIme (MAXIPIME) 2 g in sodium chloride 0.9 % 100 mL IVPB (0  g Intravenous Stopped 04/07/19 1242)  vancomycin (VANCOCIN) IVPB 1000 mg/200 mL premix (0 mg Intravenous Stopped 04/07/19 1250)  sodium chloride 0.9 % bolus 1,000 mL (1,000 mLs Intravenous Transfusing/Transfer  04/07/19 1252)  sodium chloride 0.9 % bolus 1,000 mL (0 mLs Intravenous Duplicate 0/93/26 7124)     ED Discharge Orders    None       Note:  This document was prepared using Dragon voice recognition software and may include unintentional dictation errors.   Duffy Bruce, MD 04/07/19 1430

## 2019-04-07 NOTE — ED Triage Notes (Signed)
Pt arrives via [facility]- pt worked outside [employer] and had a near syncopal episode- went to bed early last night after feeling weak- at around 10 he started having chills and body aches- nonproductive cough started this morning- pt c/o of headache, nausea, and diarrhea- tylenol 0912 1000 mg given by EMS

## 2019-04-07 NOTE — Consult Note (Signed)
Pharmacy Antibiotic Note  Chase Harris is a 59 y.o. male admitted on 04/07/2019 with sepsis.  Pharmacy has been consulted for cefepime and vancomycin dosing.  Plan: Will order cefepime 2 g q8H   Pt will receive vancomycin total loading dose of 2500 mg x 1. Will order a maintenance order of 1000 mg q12H. Predicted AUC 517. Goal AUC 400-550. Scr 0.98. Css min 14.8. Plan to order levels in 4-5 days.   Height: 5\' 8"  (172.7 cm) Weight: 245 lb (111.1 kg) IBW/kg (Calculated) : 68.4  Temp (24hrs), Avg:100.7 F (38.2 C), Min:100.7 F (38.2 C), Max:100.7 F (38.2 C)  Recent Labs  Lab 04/07/19 0942  WBC 12.5*  CREATININE 0.98  LATICACIDVEN 1.0    Estimated Creatinine Clearance: 98.2 mL/min (by C-G formula based on SCr of 0.98 mg/dL).    Allergies  Allergen Reactions  . Penicillins Rash    At birth  . Doxycycline     Unknown - pt informed pharmacist 02/17/2017.  [name] Dynabac [Dirithromycin] Nausea And Vomiting    Other reaction(s): Abdominal Pain    Antimicrobials this admission: 7/12 cefepime >>  7/12 vancomycin >>   Dose adjustments this admission: None  Microbiology results: 7/12 BCx: pending 7/12 UCx: pending   Thank you for allowing pharmacy to be a part of this patient's care.  Oswald Hillock 04/07/2019 12:18 PM

## 2019-04-07 NOTE — Progress Notes (Signed)
Advance care planning  Purpose of Encounter Sepsis  Parties in Attendance Patient  Patients Decisional capacity Alert and awake.  Able to make medical decisions.  No documented healthcare power of attorney.  He wants his wife to make decisions if he is unable to.  No ACP documents.  Discussed in detail regarding sepsis.  Treatment plan , prognosis discussed.  All questions answered.  CODE STATUS discussed and patient wishes to be full code  Time spent - 17 minutes

## 2019-04-08 LAB — COMPREHENSIVE METABOLIC PANEL
ALT: 51 U/L — ABNORMAL HIGH (ref 0–44)
AST: 21 U/L (ref 15–41)
Albumin: 3.2 g/dL — ABNORMAL LOW (ref 3.5–5.0)
Alkaline Phosphatase: 39 U/L (ref 38–126)
Anion gap: 5 (ref 5–15)
BUN: 18 mg/dL (ref 6–20)
CO2: 26 mmol/L (ref 22–32)
Calcium: 8.1 mg/dL — ABNORMAL LOW (ref 8.9–10.3)
Chloride: 104 mmol/L (ref 98–111)
Creatinine, Ser: 0.8 mg/dL (ref 0.61–1.24)
GFR calc Af Amer: >60 mL/min (ref >60)
GFR calc non Af Amer: >60 mL/min (ref >60)
Glucose, Bld: 156 mg/dL — ABNORMAL HIGH (ref 70–99)
Potassium: 3.6 mmol/L (ref 3.5–5.1)
Sodium: 135 mmol/L (ref 135–145)
Total Bilirubin: 0.5 mg/dL (ref 0.3–1.2)
Total Protein: 6.1 g/dL — ABNORMAL LOW (ref 6.5–8.1)

## 2019-04-08 LAB — CBC
HCT: 44.2 % (ref 39.0–52.0)
Hemoglobin: 13.9 g/dL (ref 13.0–17.0)
MCH: 27.1 pg (ref 26.0–34.0)
MCHC: 31.4 g/dL (ref 30.0–36.0)
MCV: 86.2 fL (ref 80.0–100.0)
Platelets: 196 10*3/uL (ref 150–400)
RBC: 5.13 MIL/uL (ref 4.22–5.81)
RDW: 13.8 % (ref 11.5–15.5)
WBC: 8.6 10*3/uL (ref 4.0–10.5)
nRBC: 0 % (ref 0.0–0.2)

## 2019-04-08 LAB — PROCALCITONIN: Procalcitonin: 0.13 ng/mL

## 2019-04-08 LAB — URINE CULTURE
Culture: <10000 — AB
Special Requests: NORMAL

## 2019-04-08 LAB — GLUCOSE, CAPILLARY
Glucose-Capillary: 155 mg/dL — ABNORMAL HIGH (ref 70–99)
Glucose-Capillary: 189 mg/dL — ABNORMAL HIGH (ref 70–99)

## 2019-04-08 NOTE — Progress Notes (Signed)
Inpatient Diabetes Program Recommendations  AACE/ADA: New Consensus Statement on Inpatient Glycemic Control   Target Ranges:  Prepandial:   less than 140 mg/dL      Peak postprandial:   less than 180 mg/dL (1-2 hours)      Critically ill patients:  140 - 180 mg/dL   Results for Chase Harris, Chase Harris (MRN [id number]) as of 04/08/2019 12:33  Ref. Range 04/07/2019 13:34 04/07/2019 17:03 04/07/2019 22:58 04/08/2019 07:46 04/08/2019 11:47  Glucose-Capillary Latest Ref Range: 70 - 99 mg/dL 175 (H) 212 (H) 234 (H) 155 (H) 189 (H)  Results for Chase Harris, Chase Harris (MRN [id number]) as of 04/08/2019 12:33  Ref. Range 04/07/2019 13:18  Hemoglobin A1C Latest Ref Range: 4.8 - 5.6 % 8.2 (H)   Review of Glycemic Control  Diabetes history: DM2 Outpatient Diabetes medications: Toujeo Max 130 units QHS, Humalog 12 units with breakfast, Humalog 30 units with lunch and supper, Farxiga 10 mg daily, Metformin 1000 mg BID Current orders for Inpatient glycemic control: Lantus 90 units QHS, Novolog 0-15 units TID with meals, Novolog 0-5 units QHS  Inpatient Diabetes Program Recommendations:   Insulin-Meal Coverage: If post prandial glucose is consistently greater than 180 mg/dl, please consider ordering Novolog 4 units TID with meals for meal coverage if patient eats at least 50% of meals.  HgbA1C: A1C 8.2% on 04/07/19 indicating an average glucose of 189 mg/dl over the past 2-3 months. Prior A1C in Care Everywhere was 8.7% on 03/12/19. Patient is followed by Dr. Gabriel Carina and was last seen on 03/12/19.  NOTE: Spoke with patient over the phone to confirm outpatient DM medications and dosages. Patient notes that he is followed by Dr. Gabriel Carina and was last seen on 03/12/19 and he is taking DM medications as noted above. Informed patient that his current A1C is 8.2% which is slightly improved from 8.7% on 03/12/19. Patient states he plans to get his A1C back down to 7% or less. Discussed current insulin orders and informed patient that his  glucose trends would be followed while inpatient. Patient verbalized understanding of information discussed and has no questions at this time.  Thanks, Barnie Alderman, RN, MSN, CDE Diabetes Coordinator Inpatient Diabetes Program [phone] (Team Pager from 8am to 5pm)

## 2019-04-08 NOTE — Consult Note (Signed)
Pharmacy Antibiotic Note  Chase Harris is a 57 y.o. male admitted on 04/07/2019 with sepsis.  Pharmacy has been consulted for cefepime and vancomycin dosing.  Plan: Will order cefepime 2 g q8H   Pt will receive vancomycin total loading dose of 2500 mg x 1. Will order a maintenance order of 1000 mg q12H. Predicted AUC 418.4. Goal AUC 400-550. Scr 0.98. Css min 11. Plan to order levels in 4-5 days.   Height: 5\' 8"  (172.7 cm) Weight: 250 lb 3.6 oz (113.5 kg) IBW/kg (Calculated) : 68.4  Temp (24hrs), Avg:99.2 F (37.3 C), Min:98.1 F (36.7 C), Max:100.1 F (37.8 C)  Recent Labs  Lab 04/07/19 0942 04/07/19 1318 04/08/19 0400  WBC 12.5*  --  8.6  CREATININE 0.98 0.88 0.80  LATICACIDVEN 1.0 0.9  --     Estimated Creatinine Clearance: 121.5 mL/min (by C-G formula based on SCr of 0.8 mg/dL).    Allergies  Allergen Reactions  . Penicillins Rash    At birth  . Doxycycline     Unknown - pt informed pharmacist 02/17/2017.  [name] Dynabac [Dirithromycin] Nausea And Vomiting    Other reaction(s): Abdominal Pain    Antimicrobials this admission: 7/12 cefepime >>  7/12 vancomycin >>   Dose adjustments this admission: None  Microbiology results: 7/12 BCx: NGTD 7/12 UCx: pending   Thank you for allowing pharmacy to be a part of this patient's care.  Rowland Lathe 04/08/2019 12:12 PM

## 2019-04-08 NOTE — Progress Notes (Signed)
Chase Harris to be D/C'd  home per MD order.  Discussed prescriptions and follow up appointments with the patient. Prescriptions given to patient, medication list explained in detail. Pt verbalized understanding.  Allergies as of 04/08/2019      Reactions   Penicillins Rash   At birth   Doxycycline    Unknown - pt informed pharmacist 02/17/2017.   Dynabac [dirithromycin] Nausea And Vomiting   Other reaction(s): Abdominal Pain      Medication List    TAKE these medications   aspirin EC 81 MG tablet Take 81 mg by mouth daily.   collagenase ointment Commonly known as: Santyl Apply 1 application topically daily.   Farxiga 10 MG Tabs tablet Generic drug: dapagliflozin propanediol Take 10 mg by mouth daily.   fluticasone 50 MCG/ACT nasal spray Commonly known as: FLONASE Place 2 sprays into the nose daily as needed for allergies or rhinitis.   gabapentin 300 MG capsule Commonly known as: NEURONTIN TAKE  1 CAPSULE BY MOUTH EVERY MORNING AND 2 CAPSULES IN THE EVENING What changed:   how much to take  how to take this  when to take this  additional instructions   HumaLOG KwikPen 100 UNIT/ML KwikPen Generic drug: insulin lispro Inject 12-30 Units into the skin See admin instructions. Inject 12u under the skin every morning with breakfast, inject 30u under the skin every day with lunch and inject 30u under the skin every day with dinner   Humira Pen 40 MG/0.4ML Pnkt Generic drug: Adalimumab Inject 40 mg into the skin every 14 (fourteen) days.   hydrochlorothiazide 25 MG tablet Commonly known as: HYDRODIURIL Take 25 mg by mouth daily.   lisinopril 40 MG tablet Commonly known as: ZESTRIL Take 40 mg by mouth daily.   loratadine 10 MG tablet Commonly known as: CLARITIN Take 10 mg by mouth daily.   meloxicam 15 MG tablet Commonly known as: MOBIC Take 1 tablet (15 mg total) by mouth daily.   metFORMIN 1000 MG tablet Commonly known as: GLUCOPHAGE Take 1,000 mg  by mouth 2 (two) times a day.   omeprazole 20 MG capsule Commonly known as: PRILOSEC Take 20 mg by mouth daily.   predniSONE 2.5 MG tablet Commonly known as: DELTASONE Take 2.5 mg by mouth daily.   Toujeo Max SoloStar 300 UNIT/ML Sopn Generic drug: Insulin Glargine (2 Unit Dial) Inject 130 Units into the skin at bedtime.       Vitals:   04/08/19 1330 04/08/19 1524  BP:    Pulse:    Resp:    Temp: 98.4 F (36.9 C) 98.4 F (36.9 C)  SpO2:      Skin clean, dry and intact without evidence of skin break down, no evidence of skin tears noted. IV catheter discontinued intact. Site without signs and symptoms of complications. Dressing and pressure applied. Pt denies pain at this time. No complaints noted.  An After Visit Summary was printed and given to the patient. Patient escorted via [place], and D/C home via private auto.  Chuck Hint RN [facility] [employer]

## 2019-04-09 LAB — HIV ANTIBODY (ROUTINE TESTING W REFLEX): HIV Screen 4th Generation wRfx: NONREACTIVE

## 2019-04-11 NOTE — Discharge Summary (Signed)
[facility] NAME: Chase Harris    MR#:  [id number]  DATE OF BIRTH:  April 04, 1961  DATE OF ADMISSION:  04/07/2019 ADMITTING PHYSICIAN: Hillary Bow, MD  DATE OF DISCHARGE: 04/08/2019  4:30 PM  PRIMARY CARE PHYSICIAN: Dion Body, MD   ADMISSION DIAGNOSIS:  Fever in adult [R50.9] Sepsis without acute organ dysfunction, due to unspecified organism ([place]) [A41.9]  DISCHARGE DIAGNOSIS:  Active Problems:   Sepsis ([place])   SECONDARY DIAGNOSIS:   Past Medical History:  Diagnosis Date  . Arthritis   . Constipation   . Diabetes mellitus without complication (HCC)    Metformin  . Diabetic peripheral neuropathy ([place])   . GERD (gastroesophageal reflux disease)   . History of prostate cancer   . Hypertension   . Prostate cancer ([place])   . Sleep apnea   . Stroke [facility])      ADMITTING HISTORY  HISTORY OF PRESENT ILLNESS:  Chase Harris  is a 58 y.o. male with a known history of hypertension, diabetes mellitus, rheumatoid arthritis on Humira presents to the emergency room complaining of weakness, lightheadedness and fever.  Patient worked out in the hot sun yesterday from 9 AM to 4 PM.  After this he felt dizzy almost like he was passing out.  Also fever.  Today he continued to have fevers in the morning and presented to ED.  Here he has a chest x-ray which is clear.  No source of infection found.  Due to fever, tachycardia patient was given IV antibiotics and is being admitted to the hospital.  He has had decreased dark urine.  HOSPITAL COURSE:   *Heatstroke Patient was admitted due to concern regarding sepsis with fever, tachycardia.  Cultures remain negative.  Initially treated with antibiotics.  His fever trended down and he improved with IV fluid resuscitation.  He did have prolonged sun exposure with exertion prior to admission.  He returned back to normal.  Procalcitonin level negative. Antibiotic stop date discharge.   Advised to return if any further fevers.  Patient is back to baseline.  *Diabetes, hypertension rheumatoid arthritis remains stable.  Discharged home to follow-up with primary care physician  CONSULTS OBTAINED:    DRUG ALLERGIES:   Allergies  Allergen Reactions  . Penicillins Rash    At birth  . Doxycycline     Unknown - pt informed pharmacist 02/17/2017.  [name] Dynabac [Dirithromycin] Nausea And Vomiting    Other reaction(s): Abdominal Pain    DISCHARGE MEDICATIONS:   Allergies as of 04/08/2019      Reactions   Penicillins Rash   At birth   Doxycycline    Unknown - pt informed pharmacist 02/17/2017.   Dynabac [dirithromycin] Nausea And Vomiting   Other reaction(s): Abdominal Pain      Medication List    TAKE these medications   aspirin EC 81 MG tablet Take 81 mg by mouth daily.   collagenase ointment Commonly known as: Santyl Apply 1 application topically daily.   Farxiga 10 MG Tabs tablet Generic drug: dapagliflozin propanediol Take 10 mg by mouth daily.   fluticasone 50 MCG/ACT nasal spray Commonly known as: FLONASE Place 2 sprays into the nose daily as needed for allergies or rhinitis.   gabapentin 300 MG capsule Commonly known as: NEURONTIN TAKE  1 CAPSULE BY MOUTH EVERY MORNING AND 2 CAPSULES IN THE EVENING What changed:   how much to take  how to take this  when to take this  additional  instructions   HumaLOG KwikPen 100 UNIT/ML KwikPen Generic drug: insulin lispro Inject 12-30 Units into the skin See admin instructions. Inject 12u under the skin every morning with breakfast, inject 30u under the skin every day with lunch and inject 30u under the skin every day with dinner   Humira Pen 40 MG/0.4ML Pnkt Generic drug: Adalimumab Inject 40 mg into the skin every 14 (fourteen) days.   hydrochlorothiazide 25 MG tablet Commonly known as: HYDRODIURIL Take 25 mg by mouth daily.   lisinopril 40 MG tablet Commonly known as: ZESTRIL Take 40 mg by  mouth daily.   loratadine 10 MG tablet Commonly known as: CLARITIN Take 10 mg by mouth daily.   meloxicam 15 MG tablet Commonly known as: MOBIC Take 1 tablet (15 mg total) by mouth daily.   metFORMIN 1000 MG tablet Commonly known as: GLUCOPHAGE Take 1,000 mg by mouth 2 (two) times a day.   omeprazole 20 MG capsule Commonly known as: PRILOSEC Take 20 mg by mouth daily.   predniSONE 2.5 MG tablet Commonly known as: DELTASONE Take 2.5 mg by mouth daily.   Toujeo Max SoloStar 300 UNIT/ML Sopn Generic drug: Insulin Glargine (2 Unit Dial) Inject 130 Units into the skin at bedtime.       Today   VITAL SIGNS:  Blood pressure (!) 150/87, pulse 84, temperature 98.4 F (36.9 C), temperature source Oral, resp. rate 19, height 5\' 8"  (1.727 m), weight 113.5 kg, SpO2 98 %.  I/O:  No intake or output data in the 24 hours ending 04/11/19 1546  PHYSICAL EXAMINATION:  Physical Exam  GENERAL:  58 y.o.-year-old patient lying in the bed with no acute distress.  LUNGS: Normal breath sounds bilaterally, no wheezing, rales,rhonchi or crepitation. No use of accessory muscles of respiration.  CARDIOVASCULAR: S1, S2 normal. No murmurs, rubs, or gallops.  ABDOMEN: Soft, non-tender, non-distended. Bowel sounds present. No organomegaly or mass.  NEUROLOGIC: Moves all 4 extremities. PSYCHIATRIC: The patient is alert and oriented x 3.  SKIN: No obvious rash, lesion, or ulcer.   DATA REVIEW:   CBC Recent Labs  Lab 04/08/19 0400  WBC 8.6  HGB 13.9  HCT 44.2  PLT 196    Chemistries  Recent Labs  Lab 04/08/19 0400  NA 135  K 3.6  CL 104  CO2 26  GLUCOSE 156*  BUN 18  CREATININE 0.80  CALCIUM 8.1*  AST 21  ALT 51*  ALKPHOS 39  BILITOT 0.5    Cardiac Enzymes No results for input(s): TROPONINI in the last 168 hours.  Microbiology Results  Results for orders placed or performed during the hospital encounter of 04/07/19  Culture, blood (Routine x 2)     Status: None  (Preliminary result)   Collection Time: 04/07/19  9:43 AM   Specimen: BLOOD  Result Value Ref Range Status   Specimen Description BLOOD LFA  Final   Special Requests   Final    BOTTLES DRAWN AEROBIC AND ANAEROBIC Blood Culture results may not be optimal due to an excessive volume of blood received in culture bottles   Culture   Final    NO GROWTH 4 DAYS Performed at [facility], [address] [place], [place] [address]    Report Status PENDING  Incomplete  Culture, blood (Routine x 2)     Status: None (Preliminary result)   Collection Time: 04/07/19  9:43 AM   Specimen: BLOOD  Result Value Ref Range Status   Specimen Description BLOOD L H  Final   Special Requests   Final    BOTTLES DRAWN AEROBIC AND ANAEROBIC Blood Culture adequate volume   Culture   Final    NO GROWTH 4 DAYS Performed at [facility], [address], [place], [place] [address]    Report Status PENDING  Incomplete  SARS Coronavirus 2 (CEPHEID - Performed in [place] hospital lab), Hosp Order     Status: None   Collection Time: 04/07/19  9:43 AM   Specimen: Nasopharyngeal Swab  Result Value Ref Range Status   SARS Coronavirus 2 NEGATIVE NEGATIVE Final    Comment: (NOTE) If result is NEGATIVE SARS-CoV-2 target nucleic acids are NOT DETECTED. The SARS-CoV-2 RNA is generally detectable in upper and lower  respiratory specimens during the acute phase of infection. The lowest  concentration of SARS-CoV-2 viral copies this assay can detect is 250  copies / mL. A negative result does not preclude SARS-CoV-2 infection  and should not be used as the sole basis for treatment or other  patient management decisions.  A negative result may occur with  improper specimen collection / handling, submission of specimen other  than nasopharyngeal swab, presence of viral mutation(s) within the  areas targeted by this assay, and inadequate number of viral copies  (<250 copies / mL). A negative result  must be combined with clinical  observations, patient history, and epidemiological information. If result is POSITIVE SARS-CoV-2 target nucleic acids are DETECTED. The SARS-CoV-2 RNA is generally detectable in upper and lower  respiratory specimens dur ing the acute phase of infection.  Positive  results are indicative of active infection with SARS-CoV-2.  Clinical  correlation with patient history and other diagnostic information is  necessary to determine patient infection status.  Positive results do  not rule out bacterial infection or co-infection with other viruses. If result is PRESUMPTIVE POSTIVE SARS-CoV-2 nucleic acids MAY BE PRESENT.   A presumptive positive result was obtained on the submitted specimen  and confirmed on repeat testing.  While 2019 novel coronavirus  (SARS-CoV-2) nucleic acids may be present in the submitted sample  additional confirmatory testing may be necessary for epidemiological  and / or clinical management purposes  to differentiate between  SARS-CoV-2 and other Sarbecovirus currently known to infect humans.  If clinically indicated additional testing with an alternate test  methodology [phone]) is advised. The SARS-CoV-2 RNA is generally  detectable in upper and lower respiratory sp ecimens during the acute  phase of infection. The expected result is Negative. Fact Sheet for Patients:  StrictlyIdeas.no Fact Sheet for Healthcare Providers: BankingDealers.co.za This test is not yet approved or cleared by the [place] FDA and has been authorized for detection and/or diagnosis of SARS-CoV-2 by FDA under an Emergency Use Authorization (EUA).  This EUA will remain in effect (meaning this test can be used) for the duration of the COVID-19 declaration under Section 564(b)(1) of the Act, 21 U.S.C. section 360bbb-3(b)(1), unless the authorization is terminated or revoked sooner. Performed at [facility], [address] [place], [place] [address]   Urine culture     Status: Abnormal   Collection Time: 04/07/19 11:36 AM   Specimen: Urine, Clean Catch  Result Value Ref Range Status   Specimen Description   Final    URINE, CLEAN CATCH Performed at [facility], [address] [place], [place] [address]    Special Requests   Final    Normal Performed at [facility], [place],  [place] [address]    Culture (A)  Final    <10,000 COLONIES/mL INSIGNIFICANT GROWTH Performed at [facility], [place] [address] [place], [place] [address]    Report Status 04/08/2019 FINAL  Final    RADIOLOGY:  No results found.  Follow up with PCP in 1 week.  Management plans discussed with the patient, family and they are in agreement.  CODE STATUS:  Code Status History    Date Active Date Inactive Code Status Order ID Comments User Context   04/07/2019 1146 04/08/2019 1954 Full Code [id number]  Hillary Bow, MD ED   Advance Care Planning Activity      TOTAL TIME TAKING CARE OF THIS PATIENT ON DAY OF DISCHARGE: more than 30 minutes.   Leia Alf Jalacia Mattila M.D on 04/11/2019 at 3:46 PM  Between 7am to 6pm - Pager - [phone]  After 6pm go to [email or web address] - password [email or web address] [facility]  Office  [phone]  CC: Primary care physician; Dion Body, MD  Note: This dictation was prepared with Dragon dictation along with smaller phrase technology. Any transcriptional errors that result from this process are unintentional.

## 2019-04-12 LAB — CULTURE, BLOOD (ROUTINE X 2)
Culture: NO GROWTH
Culture: NO GROWTH
Special Requests: ADEQUATE

## 2019-04-15 ENCOUNTER — Ambulatory Visit: Payer: Commercial Managed Care - PPO | Admitting: Podiatry

## 2019-05-06 ENCOUNTER — Encounter: Payer: Self-pay | Admitting: Podiatry

## 2019-05-06 ENCOUNTER — Ambulatory Visit (INDEPENDENT_AMBULATORY_CARE_PROVIDER_SITE_OTHER): Payer: Commercial Managed Care - PPO | Admitting: Podiatry

## 2019-05-06 ENCOUNTER — Other Ambulatory Visit: Payer: Self-pay

## 2019-05-06 VITALS — Temp 97.6°F

## 2019-05-06 DIAGNOSIS — M779 Enthesopathy, unspecified: Secondary | ICD-10-CM

## 2019-05-07 NOTE — Progress Notes (Signed)
Subjective: 58 year old male presents the office today for follow-up evaluation left foot pain.  Points more to the lateral aspect the ankle he gets majority discomfort.  He also presents today to see Liliane Channel.  No recent injury.  Since I last saw him he has been admitted to the hospital for heat exhaustion.  Initially thought it was COVID but tested negative.  Denies any systemic complaints such as fevers, chills, nausea, vomiting. No acute changes since last appointment, and no other complaints at this time.   Objective: AAO x3, NAD DP/PT pulses palpable bilaterally, CRT less than 3 seconds Majority of tenderness present to the lateral aspect of foot most along sinus tarsi.  Modest component worse the peroneal tendons.  Flexor, extensor tendons appear to be intact.  No area pinpoint tenderness.  Upon weightbearing evaluation he does tend to roll more to the lateral aspect. No open lesions or pre-ulcerative lesions.  No pain with calf compression, swelling, warmth, erythema  Assessment: Capsulitis, tendinitis  Plan: -All treatment options discussed with the patient including all alternatives, risks, complications.  -Steroid injection performed the left foot.  He still works with modifications of orthotics.  Ankle brace if needed. -Patient encouraged to call the office with any questions, concerns, change in symptoms.   Procedure: Injection intermediate joint Discussed alternatives, risks, complications and verbal consent was obtained.  Location: Left sinus tarsi Skin Prep: Betadine Injectate: 0.5cc 0.5% marcaine plain, 0.5 cc 2% lidocaine plain and, 1 cc kenalog 10. Disposition: Patient tolerated procedure well. Injection site dressed with a band-aid.  Post-injection care was discussed and return precautions discussed.   Trula Slade DPM

## 2019-06-10 ENCOUNTER — Ambulatory Visit: Payer: Commercial Managed Care - PPO | Admitting: Podiatry

## 2019-06-11 ENCOUNTER — Ambulatory Visit: Payer: Commercial Managed Care - PPO | Admitting: Podiatry

## 2019-07-02 ENCOUNTER — Ambulatory Visit (INDEPENDENT_AMBULATORY_CARE_PROVIDER_SITE_OTHER): Payer: Commercial Managed Care - PPO | Admitting: Podiatry

## 2019-07-02 ENCOUNTER — Other Ambulatory Visit: Payer: Self-pay

## 2019-07-02 DIAGNOSIS — L309 Dermatitis, unspecified: Secondary | ICD-10-CM | POA: Diagnosis not present

## 2019-07-02 DIAGNOSIS — M722 Plantar fascial fibromatosis: Secondary | ICD-10-CM

## 2019-07-02 DIAGNOSIS — M779 Enthesopathy, unspecified: Secondary | ICD-10-CM

## 2019-07-02 NOTE — Patient Instructions (Signed)

## 2019-07-03 NOTE — Progress Notes (Signed)
Subjective: 58 year old male presents the office today for bilateral foot pain mostly to the bottom of his left heel.  He states he gets pain predominantly  after rest.  No recent injury.  No swelling.  He also states that he did have some swelling to the right big toe joint that area is doing better.  He also has concerns of hematuria and small little red bumps with pus around the ankle on both sides.  This happened last summer and he noticed it again this summer he showed me pictures.  Currently denies any of these symptoms. Denies any systemic complaints such as fevers, chills, nausea, vomiting. No acute changes since last appointment, and no other complaints at this time.   Objective: AAO x3, NAD DP/PT pulses palpable bilaterally, CRT less than 3 seconds Tenderness to palpation along the plantar medial tubercle of the calcaneus at the insertion of plantar fascia on the left foot. There is no pain along the course of the plantar fascia within the arch of the foot. Plantar fascia appears to be intact. There is no pain with lateral compression of the calcaneus or pain with vibratory sensation. There is no pain along the course or insertion of the achilles tendon. Minimal tenderness along the right first MPJ but there is no erythema or warmth.  There are what appears to be resolved small appearing blisters circumferentially around the ankles and this corresponds with the socks cut off just proximal to the ankle.  There is currently no active signs of infection.  Other areas of tenderness to bilateral lower extremities. No pain with calf compression, swelling, warmth, erythema  Assessment: Left heel pain, plan fasciitis, right resolving first MPJ capsulitis; dermatitis  Plan:  -All treatment options discussed with the patient including all alternatives, risks, complications.  -I think that the skin rash coming more from heat as it happened last summer in the summer and it corresponds with her socks cut  off.  Recommended antibiotic ointment and monitor for any reoccurrence.  Discussed changing socks. -Steroid injection performed the left heel.  On the plantar medial tubercle of the calcaneus at the maximal tenderness point mixture of 1 cc Kenalog 10, 0.5 cc of Marcaine plain, 0.5 cc lidocaine plain was infiltrated without complications.  Postinjection care discussed. -Right first MPJ is resolving.  We will continue to monitor.  Discussed wearing stiffer soled shoes.  No follow-ups on file.  Trula Slade DPM  -Patient encouraged to call the office with any questions, concerns, change in symptoms.

## 2019-08-06 ENCOUNTER — Ambulatory Visit: Payer: Commercial Managed Care - PPO | Admitting: Podiatry

## 2019-08-24 ENCOUNTER — Encounter: Payer: Self-pay | Admitting: Podiatry

## 2019-08-26 MED ORDER — GABAPENTIN 300 MG PO CAPS: ORAL_CAPSULE | ORAL | 3 refills | Status: DC

## 2019-08-29 ENCOUNTER — Ambulatory Visit: Payer: Commercial Managed Care - PPO | Admitting: Podiatry

## 2019-09-16 ENCOUNTER — Ambulatory Visit: Payer: Commercial Managed Care - PPO | Admitting: Podiatry

## 2019-10-07 ENCOUNTER — Other Ambulatory Visit: Payer: Self-pay

## 2019-10-07 ENCOUNTER — Ambulatory Visit (INDEPENDENT_AMBULATORY_CARE_PROVIDER_SITE_OTHER): Payer: Commercial Managed Care - PPO | Admitting: Podiatry

## 2019-10-07 ENCOUNTER — Other Ambulatory Visit: Payer: Self-pay | Admitting: Podiatry

## 2019-10-07 ENCOUNTER — Encounter: Payer: Self-pay | Admitting: Podiatry

## 2019-10-07 DIAGNOSIS — E1149 Type 2 diabetes mellitus with other diabetic neurological complication: Secondary | ICD-10-CM

## 2019-10-07 DIAGNOSIS — M722 Plantar fascial fibromatosis: Secondary | ICD-10-CM

## 2019-10-07 MED ORDER — GABAPENTIN 300 MG PO CAPS: ORAL_CAPSULE | ORAL | 3 refills | Status: DC

## 2019-10-07 NOTE — Patient Instructions (Signed)

## 2019-10-08 ENCOUNTER — Other Ambulatory Visit: Payer: Self-pay | Admitting: Podiatry

## 2019-10-08 MED ORDER — GABAPENTIN 300 MG PO CAPS: ORAL_CAPSULE | ORAL | 3 refills | Status: DC

## 2019-10-11 DIAGNOSIS — M722 Plantar fascial fibromatosis: Secondary | ICD-10-CM | POA: Insufficient documentation

## 2019-10-11 NOTE — Progress Notes (Signed)
Subjective: 59 year old male presents the office today for follow-up evaluation of foot pain.  He states that he but he has been getting worse he also has pain in the bottom of his left heel.  The right heel needs occasional discomfort from the skin the left side.  He denies any recent injury or falls and denies any weakness.  No recent injury.  Denies any systemic complaints such as fevers, chills, nausea, vomiting. No acute changes since last appointment, and no other complaints at this time.   Last blood sugar recheck was 202 this morning  Objective: AAO x3, NAD DP/PT pulses palpable bilaterally, CRT less than 3 seconds Sensation decreased with Semmes Weinstein monofilament. There is tenderness palpation on the plantar medial tubercle of the calcaneus at the insertion of plantar fascia on the left side.  Plantar fascial appears to be intact.  No pain with lateral compression of calcaneus.  No pain with Achilles tendon.  No significant pain on the right side.  No other area of tenderness.  He is describing more burning pain to his toes.  He is currently on gabapentin. No open lesions or pre-ulcerative lesions.  No pain with calf compression, swelling, warmth, erythema  Assessment: Left foot plantar fasciitis with worsening neuropathy  Plan: -All treatment options discussed with the patient including all alternatives, risks, complications.  -Steroid injection performed to the left foot today.  See procedure note below.  Continue stretching, icing daily as well as wearing supportive shoes. -We will increase gabapentin.  Take 1 tablet in the morning 1 at lunch and 2 at night.  Monitor for side effects. -Patient encouraged to call the office with any questions, concerns, change in symptoms.   Return in about 3 months (around 01/05/2020).  Or sooner if needed  Trula Slade DPM

## 2020-01-06 ENCOUNTER — Ambulatory Visit (INDEPENDENT_AMBULATORY_CARE_PROVIDER_SITE_OTHER): Payer: Commercial Managed Care - PPO | Admitting: Podiatry

## 2020-01-06 ENCOUNTER — Other Ambulatory Visit: Payer: Self-pay

## 2020-01-06 ENCOUNTER — Encounter: Payer: Self-pay | Admitting: Podiatry

## 2020-01-06 VITALS — Temp 96.8°F

## 2020-01-06 DIAGNOSIS — E1149 Type 2 diabetes mellitus with other diabetic neurological complication: Secondary | ICD-10-CM

## 2020-01-06 DIAGNOSIS — M722 Plantar fascial fibromatosis: Secondary | ICD-10-CM | POA: Diagnosis not present

## 2020-01-06 NOTE — Progress Notes (Signed)
Subjective: 59 year old male with PMH significant for diabetes mellitus with neuropathy and seronegative RA presents the office today for follow-up evaluation of left foot pain.  Overall he does state the pain is somewhat better.  Is asking another steroid injection today.  No recent injury or changes otherwise.  Objective: AAO x3, NAD DP/PT pulses palpable bilaterally, CRT less than 3 seconds Sensation decreased with Semmes Weinstein monofilament. There is improvement yet continued tenderness palpation on plantar medial tubercle of the calcaneus.  There is no pain with lateral compression.  Plantar fascial appears to be intact.   No open lesions or pre-ulcerative lesions.  No pain with calf compression, swelling, warmth, erythema  Assessment: Left foot plantar fasciitis  Plan: -All treatment options discussed with the patient including all alternatives, risks, complications.  -Steroid injection performed to the left foot today.  See procedure note below.  Continue stretching, icing daily as well as wearing supportive shoes. -He states the gabapentin 4 tablets a day has been very helpful.   Procedure: Injection Tendon/Ligament Discussed alternatives, risks, complications and verbal consent was obtained.  Location: LEFT plantar fascia at the glabrous junction; medial approach. Skin Prep:Alcohol. Injectate: 0.5cc 0.5% marcaine plain, 0.5 cc 2% lidocaine plain and, 1 cc kenalog 10. Disposition: Patient tolerated procedure well. Injection site dressed with a band-aid.  Post-injection care was discussed and return precautions discussed.   Return in about 3 months (around 04/06/2020).  Trula Slade DPM

## 2020-03-04 ENCOUNTER — Other Ambulatory Visit: Payer: Self-pay | Admitting: Podiatry

## 2020-03-05 ENCOUNTER — Telehealth: Payer: Self-pay | Admitting: *Deleted

## 2020-03-05 NOTE — Telephone Encounter (Signed)
Message left informing pt the gabapentin had been sent to his pharmacy.

## 2020-03-05 NOTE — Telephone Encounter (Signed)
Pt ask that his gabapentin be refilled.

## 2020-03-10 ENCOUNTER — Other Ambulatory Visit: Payer: Self-pay | Admitting: Specialist

## 2020-03-10 DIAGNOSIS — R059 Cough, unspecified: Secondary | ICD-10-CM

## 2020-03-10 DIAGNOSIS — R911 Solitary pulmonary nodule: Secondary | ICD-10-CM

## 2020-03-19 ENCOUNTER — Ambulatory Visit
Admission: RE | Admit: 2020-03-19 | Discharge: 2020-03-19 | Disposition: A | Discharge status: Home / Self Care | Payer: Commercial Managed Care - PPO | Source: Ambulatory Visit | Attending: Specialist | Admitting: Specialist

## 2020-03-19 ENCOUNTER — Other Ambulatory Visit: Payer: Self-pay

## 2020-03-19 DIAGNOSIS — R059 Cough, unspecified: Secondary | ICD-10-CM

## 2020-03-19 DIAGNOSIS — R911 Solitary pulmonary nodule: Secondary | ICD-10-CM | POA: Insufficient documentation

## 2020-03-19 DIAGNOSIS — R05 Cough: Secondary | ICD-10-CM | POA: Diagnosis present

## 2020-04-06 ENCOUNTER — Other Ambulatory Visit: Payer: Self-pay

## 2020-04-06 ENCOUNTER — Ambulatory Visit (INDEPENDENT_AMBULATORY_CARE_PROVIDER_SITE_OTHER): Payer: Commercial Managed Care - PPO | Admitting: Podiatry

## 2020-04-06 ENCOUNTER — Encounter: Payer: Self-pay | Admitting: Podiatry

## 2020-04-06 DIAGNOSIS — E1149 Type 2 diabetes mellitus with other diabetic neurological complication: Secondary | ICD-10-CM

## 2020-04-06 DIAGNOSIS — M779 Enthesopathy, unspecified: Secondary | ICD-10-CM | POA: Diagnosis not present

## 2020-04-06 DIAGNOSIS — M722 Plantar fascial fibromatosis: Secondary | ICD-10-CM | POA: Diagnosis not present

## 2020-04-07 NOTE — Progress Notes (Signed)
Subjective: 59 year old male presents the office today for follow-up evaluation of chronic left foot pain.  He states the arch of the heel is doing much better but he points to the fifth metatarsal head plantarly where he gets the majority of discomfort.  This is most with walking.  Denies any swelling or redness and no recent injury or falls. Denies any systemic complaints such as fevers, chills, nausea, vomiting. No acute changes since last appointment, and no other complaints at this time.   Objective: AAO x3, NAD DP/PT pulses palpable bilaterally, CRT less than 3 seconds Tenderness palpation left foot submetatarsal 5.  Minimal edema.  Is no erythema or warmth.  No other areas of pinpoint tenderness identified today.  No pain with course or insertion of plantar fascial. No open lesions or pre-ulcerative lesions.  No pain with calf compression, swelling, warmth, erythema  Assessment: Tendinitis left fifth MPJ plantarly  Plan: -All treatment options discussed with the patient including all alternatives, risks, complications.  -Steroid injection performed today.  Skin was cleaned with alcohol and a mixture of half cc dexamethasone phosphate, half cc Marcaine plain was infiltrated fifth metatarsal head plantarly without complications.  Postinjection care discussed.  Offloading pad dispensed. -Continue with daily stretching exercises as well as wearing supportive shoes. -Patient encouraged to call the office with any questions, concerns, change in symptoms.   Trula Slade DPM

## 2020-04-09 ENCOUNTER — Encounter: Payer: Self-pay | Admitting: Podiatry

## 2020-07-01 ENCOUNTER — Other Ambulatory Visit: Payer: Self-pay | Admitting: Podiatry

## 2020-07-06 NOTE — Telephone Encounter (Signed)
Please Advise

## 2020-10-13 ENCOUNTER — Ambulatory Visit: Payer: Commercial Managed Care - PPO | Admitting: Podiatry

## 2020-11-02 ENCOUNTER — Other Ambulatory Visit: Payer: Self-pay

## 2020-11-02 ENCOUNTER — Ambulatory Visit: Payer: Medicare Other | Admitting: Podiatry

## 2020-11-02 DIAGNOSIS — I739 Peripheral vascular disease, unspecified: Secondary | ICD-10-CM | POA: Diagnosis not present

## 2020-11-02 DIAGNOSIS — W540XXS Bitten by dog, sequela: Secondary | ICD-10-CM | POA: Diagnosis not present

## 2020-11-02 DIAGNOSIS — E1149 Type 2 diabetes mellitus with other diabetic neurological complication: Secondary | ICD-10-CM | POA: Diagnosis not present

## 2020-11-02 DIAGNOSIS — E119 Type 2 diabetes mellitus without complications: Secondary | ICD-10-CM | POA: Diagnosis not present

## 2020-11-02 MED ORDER — GABAPENTIN 300 MG PO CAPS: ORAL_CAPSULE | ORAL | 3 refills | Status: DC

## 2020-11-04 NOTE — Progress Notes (Signed)
Subjective: 60 year old male presents the office today for diabetic foot evaluation.  He states that he has intermittent pain with his feet when he moves around.  No recent injury or falls.  Cells neuropathy.  Denies any open sores.  He did recently get bit by a dog that he was seen in urgent care for.  He is on antibiotics and the areas are healing.  He is also concerned about possible circulation.  Gets pain in his legs at times.  He states that he needs to make an appointment with a vascular doctor again for further evaluation of this.  He has not been seen in some time for circulation.   Objective: AAO x3, NAD DP, PT pulse palpable to his foot however describing claudication symptoms to his legs.  There is no open lesion identified.  There is no area pinpoint tenderness.  No edema, erythema.  Posterior to the Achilles tendon on the left side a scab is present where he had a laceration from a dog bite.  There is no drainage or pus or signs of infection.  Occasional discomfort. No pain with calf compression, swelling, warmth, erythema  Assessment: 60 year old male with type 2 diabetes with neuropathy; concern for claudication symptoms; dog bite which is healing  Plan: -All treatment options discussed with the patient including all alternatives, risks, complications.  -Referral placed for [facility] for reevaluation of circulation given leg pain. -Continue antibiotic ointment for the area of the dog bite.  No signs of infection currently.  He was seen in urgent care initially for this injury. -Continue to monitor neuropathy.  Refill gabapentin today. -Patient encouraged to call the office with any questions, concerns, change in symptoms.   Trula Slade DPM

## 2020-11-09 ENCOUNTER — Ambulatory Visit (INDEPENDENT_AMBULATORY_CARE_PROVIDER_SITE_OTHER): Payer: Medicare Other

## 2020-11-09 ENCOUNTER — Ambulatory Visit: Payer: Medicare Other | Admitting: Podiatry

## 2020-11-09 ENCOUNTER — Other Ambulatory Visit: Payer: Self-pay

## 2020-11-09 DIAGNOSIS — M1000 Idiopathic gout, unspecified site: Secondary | ICD-10-CM

## 2020-11-09 DIAGNOSIS — M779 Enthesopathy, unspecified: Secondary | ICD-10-CM

## 2020-11-09 DIAGNOSIS — M79671 Pain in right foot: Secondary | ICD-10-CM

## 2020-11-09 MED ORDER — TRIAMCINOLONE ACETONIDE 10 MG/ML IJ SUSP
10.0000 mg | Freq: Once | INTRAMUSCULAR | Status: AC
  Administered 2020-11-09: 10 mg

## 2020-11-09 MED ORDER — COLCHICINE 0.6 MG PO TABS: 0.6000 mg | ORAL_TABLET | Freq: Every day | ORAL | 0 refills | Status: DC

## 2020-11-09 NOTE — Patient Instructions (Signed)
Gout  Gout is painful swelling of your joints. Gout is a type of arthritis. It is caused by having too much uric acid in your body. Uric acid is a chemical that is made when your body breaks down substances called purines. If your body has too much uric acid, sharp crystals can form and build up in your joints. This causes pain and swelling. Gout attacks can happen quickly and be very painful (acute gout). Over time, the attacks can affect more joints and happen more often (chronic gout). What are the causes?  Too much uric acid in your blood. This can happen because: ? Your kidneys do not remove enough uric acid from your blood. ? Your body makes too much uric acid. ? You eat too many foods that are high in purines. These foods include organ meats, some seafood, and beer.  Trauma or stress. What increases the risk?  Having a family history of gout.  Being male and middle-aged.  Being male and having gone through menopause.  Being very overweight (obese).  Drinking alcohol, especially beer.  Not having enough water in the body (being dehydrated).  Losing weight too quickly.  Having an organ transplant.  Having lead poisoning.  Taking certain medicines.  Having kidney disease.  Having a skin condition called psoriasis. What are the signs or symptoms? An attack of acute gout usually happens in just one joint. The most common place is the big toe. Attacks often start at night. Other joints that may be affected include joints of the feet, ankle, knee, fingers, wrist, or elbow. Symptoms of an attack may include:  Very bad pain.  Warmth.  Swelling.  Stiffness.  Shiny, red, or purple skin.  Tenderness. The affected joint may be very painful to touch.  Chills and fever. Chronic gout may cause symptoms more often. More joints may be involved. You may also have white or yellow lumps (tophi) on your hands or feet or in other areas near your joints.   How is this  treated?  Treatment for this condition has two phases: treating an acute attack and preventing future attacks.  Acute gout treatment may include: ? NSAIDs. ? Steroids. These are taken by mouth or injected into a joint. ? Colchicine. This medicine relieves pain and swelling. It can be given by mouth or through an IV tube.  Preventive treatment may include: ? Taking small doses of NSAIDs or colchicine daily. ? Using a medicine that reduces uric acid levels in your blood. ? Making changes to your diet. You may need to see a food expert (dietitian) about what to eat and drink to prevent gout. Follow these instructions at home: During a gout attack  If told, put ice on the painful area: ? Put ice in a plastic bag. ? Place a towel between your skin and the bag. ? Leave the ice on for 20 minutes, 2-3 times a day.  Raise (elevate) the painful joint above the level of your heart as often as you can.  Rest the joint as much as possible. If the joint is in your leg, you may be given crutches.  Follow instructions from your doctor about what you cannot eat or drink.   Avoiding future gout attacks  Eat a low-purine diet. Avoid foods and drinks such as: ? Liver. ? Kidney. ? Anchovies. ? Asparagus. ? Herring. ? Mushrooms. ? Mussels. ? Beer.  Stay at a healthy weight. If you want to lose weight, talk with your doctor. Do   not lose weight too fast.  Start or continue an exercise plan as told by your doctor. Eating and drinking  Drink enough fluids to keep your pee (urine) pale yellow.  If you drink alcohol: ? Limit how much you use to:  0-1 drink a day for women.  0-2 drinks a day for men. ? Be aware of how much alcohol is in your drink. In the [place] one drink equals one 12 oz bottle of beer (355 mL), one 5 oz glass of wine (148 mL), or one 1 oz glass of hard liquor (44 mL). General instructions  Take over-the-counter and prescription medicines only as told by your doctor.  Do  not drive or use heavy machinery while taking prescription pain medicine.  Return to your normal activities as told by your doctor. Ask your doctor what activities are safe for you.  Keep all follow-up visits as told by your doctor. This is important. Contact a doctor if:  You have another gout attack.  You still have symptoms of a gout attack after 10 days of treatment.  You have problems (side effects) because of your medicines.  You have chills or a fever.  You have burning pain when you pee (urinate).  You have pain in your lower back or belly. Get help right away if:  You have very bad pain.  Your pain cannot be controlled.  You cannot pee. Summary  Gout is painful swelling of the joints.  The most common site of pain is the big toe, but it can affect other joints.  Medicines and avoiding some foods can help to prevent and treat gout attacks. This information is not intended to replace advice given to you by your health care provider. Make sure you discuss any questions you have with your health care provider. Document Revised: 04/04/2018 Document Reviewed: 04/04/2018 Elsevier Patient Education  2021 [employer]vier Inc.  

## 2020-11-11 LAB — BASIC METABOLIC PANEL
BUN/Creatinine Ratio: 18 (ref 10–24)
BUN: 21 mg/dL (ref 8–27)
CO2: 25 mmol/L (ref 20–29)
Calcium: 10.2 mg/dL (ref 8.6–10.2)
Chloride: 97 mmol/L (ref 96–106)
Creatinine, Ser: 1.19 mg/dL (ref 0.76–1.27)
GFR calc Af Amer: 76 mL/min/{1.73_m2} (ref >59)
GFR calc non Af Amer: 66 mL/min/{1.73_m2} (ref >59)
Glucose: 176 mg/dL — ABNORMAL HIGH (ref 65–99)
Potassium: 4.7 mmol/L (ref 3.5–5.2)
Sodium: 141 mmol/L (ref 134–144)

## 2020-11-11 LAB — CBC WITH DIFFERENTIAL/PLATELET
Basophils Absolute: 0.1 10*3/uL (ref 0.0–0.2)
Basos: 1 %
EOS (ABSOLUTE): 0.1 10*3/uL (ref 0.0–0.4)
Eos: 1 %
Hematocrit: 48.6 % (ref 37.5–51.0)
Hemoglobin: 15.7 g/dL (ref 13.0–17.7)
Immature Grans (Abs): 0.1 10*3/uL (ref 0.0–0.1)
Immature Granulocytes: 1 %
Lymphocytes Absolute: 1.9 10*3/uL (ref 0.7–3.1)
Lymphs: 20 %
MCH: 27.5 pg (ref 26.6–33.0)
MCHC: 32.3 g/dL (ref 31.5–35.7)
MCV: 85 fL (ref 79–97)
Monocytes Absolute: 0.8 10*3/uL (ref 0.1–0.9)
Monocytes: 8 %
Neutrophils Absolute: 6.7 10*3/uL (ref 1.4–7.0)
Neutrophils: 69 %
Platelets: 253 10*3/uL (ref 150–450)
RBC: 5.7 x10E6/uL (ref 4.14–5.80)
RDW: 13.1 % (ref 11.6–15.4)
WBC: 9.8 10*3/uL (ref 3.4–10.8)

## 2020-11-11 LAB — URIC ACID: Uric Acid: 8.2 mg/dL (ref 3.8–8.4)

## 2020-11-12 ENCOUNTER — Encounter: Payer: Self-pay | Admitting: Podiatry

## 2020-11-13 NOTE — Progress Notes (Signed)
Subjective: 60 year old male presents the office today for concerns of right foot pain.  Presents today for an acute appointment.  He states that on Friday he started getting redness and swelling to the right big toe joint without any injury.  The pain came on suddenly as well as the swelling.  Had no recent treatment.  He has no other concerns today. Denies any systemic complaints such as fevers, chills, nausea, vomiting. No acute changes since last appointment, and no other complaints at this time.   Objective: AAO x3, NAD DP/PT pulses palpable bilaterally, CRT less than 3 seconds Edema erythema mostly to the right first MPJ.  There is no open lesions and there is no drainage.  Decreased range of motion of first MPJ given discomfort.  Ankle joint range of motion intact.  No other areas of discomfort identified.  No open lesions bilaterally. No pain with calf compression, swelling, warmth, erythema  Assessment: Right foot capsulitis, likely gout  Plan: -All treatment options discussed with the patient including all alternatives, risks, complications.  -X-rays obtained and reviewed.  No evidence of acute fracture. -Steroid injection performed.  Skin was prepped with alcohol/betadine and a mixture of 1 cc, 10, 0.5 cc of Marcaine plain, 0.5 cc lidocaine plain was infiltrated along the area of tenderness without complications.  Postinjection care discussed.  Tolerated well. -Prescribed colchicine -Blood work ordered -Patient encouraged to call the office with any questions, concerns, change in symptoms.   Trula Slade DPM

## 2020-11-19 ENCOUNTER — Other Ambulatory Visit (INDEPENDENT_AMBULATORY_CARE_PROVIDER_SITE_OTHER): Payer: Self-pay | Admitting: Vascular Surgery

## 2020-11-19 DIAGNOSIS — I739 Peripheral vascular disease, unspecified: Secondary | ICD-10-CM

## 2020-11-22 NOTE — Progress Notes (Signed)
MRN : [id number]  Chase Harris is a 60 y.o. (02/06/1961) male who presents with chief complaint of No chief complaint on file. [name]  History of Present Illness:    The patient is seen for evaluation of painful lower extremities and diminished pulses. Patient notes the pain is always associated with activity and is very consistent day today. Typically, the pain occurs at less than one block, progress is as activity continues to the point that the patient must stop walking. Resting including standing still for several minutes allowed resumption of the activity and the ability to walk a similar distance before stopping again. Uneven terrain and inclined shorten the distance. The pain has been progressive over the past several years. The patient states the inability to walk is now having a profound negative impact on quality of life and daily activities.  The patient denies rest pain or dangling of an extremity off the side of the bed during the night for relief. No open wounds or sores at this time. No prior interventions or surgeries.  No history of back problems or DJD of the lumbar sacral spine.   The patient denies changes in claudication symptoms or new rest pain symptoms.  No new ulcers or wounds of the foot.  The patient's blood pressure has been stable and relatively well controlled. The patient denies amaurosis fugax or recent TIA symptoms. There are no recent neurological changes noted. The patient denies history of DVT, PE or superficial thrombophlebitis. The patient denies recent episodes of angina or shortness of breath.   No outpatient medications have been marked as taking for the 11/23/20 encounter (Appointment) with Delana Meyer, Dolores Lory, MD.    Past Medical History:  Diagnosis Date  . Arthritis   . Constipation   . Diabetes mellitus without complication (HCC)    Metformin  . Diabetic peripheral neuropathy ([place])   . GERD (gastroesophageal reflux disease)   . History  of prostate cancer   . Hypertension   . Prostate cancer ([place])   . Sleep apnea   . Stroke [facility])     Past Surgical History:  Procedure Laterality Date  . COLONOSCOPY WITH PROPOFOL N/A 06/27/2018   Procedure: COLONOSCOPY WITH PROPOFOL;  Surgeon: Manya Silvas, MD;  Location[facility] ENDOSCOPY;  Service: Endoscopy;  Laterality: N/A;  . ESOPHAGOGASTRODUODENOSCOPY (EGD) WITH PROPOFOL N/A 06/27/2018   Procedure: ESOPHAGOGASTRODUODENOSCOPY (EGD) WITH PROPOFOL;  Surgeon: Manya Silvas, MD;  Location: [facility] ENDOSCOPY;  Service: Endoscopy;  Laterality: N/A;  . HERNIA REPAIR    . PROSTATECTOMY    . TAYLOR BUNIONECTOMY    . WISDOM TOOTH EXTRACTION      Social History Social History   Tobacco Use  . Smoking status: Former Research scientist (life sciences)  . Smokeless tobacco: Never Used  Vaping Use  . Vaping Use: Never used  Substance Use Topics  . Alcohol use: Yes    Alcohol/week: 4.0 standard drinks    Types: 2 Cans of beer, 2 Shots of liquor per week  . Drug use: No    Family History Family History  Problem Relation Age of Onset  . Congestive Heart Failure Father   . Hypertension Father   . Kidney disease Father   . Diabetes Paternal Grandmother   . Diabetes Paternal Aunt   . Hypothyroidism Mother   No family history of bleeding/clotting disorders, porphyria or autoimmune disease   Allergies  Allergen Reactions  . Penicillins Rash    At birth  . Doxycycline     Unknown -  pt informed pharmacist 02/17/2017.  [name] Dynabac [Dirithromycin] Nausea And Vomiting    Other reaction(s): Abdominal Pain     REVIEW OF SYSTEMS (Negative unless checked)  Constitutional: [] Weight loss  [] Fever  [] Chills Cardiac: [] Chest pain   [] Chest pressure   [] Palpitations   [] Shortness of breath when laying flat   [] Shortness of breath with exertion. Vascular:  [x] Pain in legs with walking   [x] Pain in legs at rest  [] History of DVT   [] Phlebitis   [] Swelling in legs   [] Varicose veins   [] Non-healing ulcers Pulmonary:    [] Uses home oxygen   [] Productive cough   [] Hemoptysis   [] Wheeze  [] COPD   [] Asthma Neurologic:  [] Dizziness   [] Seizures   [] History of stroke   [] History of TIA  [] Aphasia   [] Vissual changes   [] Weakness or numbness in arm   [] Weakness or numbness in leg Musculoskeletal:   [] Joint swelling   [] Joint pain   [] Low back pain Hematologic:  [] Easy bruising  [] Easy bleeding   [] Hypercoagulable state   [] Anemic Gastrointestinal:  [] Diarrhea   [] Vomiting  [] Gastroesophageal reflux/heartburn   [] Difficulty swallowing. Genitourinary:  [] Chronic kidney disease   [] Difficult urination  [] Frequent urination   [] Blood in urine Skin:  [] Rashes   [] Ulcers  Psychological:  [] History of anxiety   []  History of major depression.  Physical Examination  There were no vitals filed for this visit. There is no height or weight on file to calculate BMI. Gen: WD/WN, NAD Head: [place]/AT, No temporalis wasting.  Ear/Nose/Throat: Hearing grossly intact, nares w/o erythema or drainage, poor dentition Eyes: PER, EOMI, sclera nonicteric.  Neck: Supple, no masses.  No bruit or JVD.  Pulmonary:  Good air movement, clear to auscultation bilaterally, no use of accessory muscles.  Cardiac: RRR, normal S1, S2, no Murmurs. Vascular:  Vessel Right Left  Radial Palpable Palpable  PT Trace Palpable Trace Palpable  DP Not Palpable Not Palpable  Gastrointestinal: soft, non-distended. No guarding/no peritoneal signs.  Musculoskeletal: M/S 5/5 throughout.  No deformity or atrophy.  Neurologic: CN 2-12 intact. Pain and light touch intact in extremities.  Symmetrical.  Speech is fluent. Motor exam as listed above. Psychiatric: Judgment intact, Mood & affect appropriate for pt's clinical situation. Dermatologic: No rashes or ulcers noted.  No changes consistent with cellulitis.   CBC Lab Results  Component Value Date   WBC 9.8 11/10/2020   HGB 15.7 11/10/2020   HCT 48.6 11/10/2020   MCV 85 11/10/2020   PLT 253 11/10/2020     BMET    Component Value Date/Time   NA 141 11/10/2020 1051   NA 138 03/07/2013 0333   K 4.7 11/10/2020 1051   K 3.8 03/07/2013 0333   CL 97 11/10/2020 1051   CL 103 03/07/2013 0333   CO2 25 11/10/2020 1051   CO2 28 03/07/2013 0333   GLUCOSE 176 (H) 11/10/2020 1051   GLUCOSE 156 (H) 04/08/2019 0400   GLUCOSE 221 (H) 03/07/2013 0333   BUN 21 11/10/2020 1051   BUN 18 03/07/2013 0333   CREATININE 1.19 11/10/2020 1051   CREATININE 1.04 03/07/2013 0333   CALCIUM 10.2 11/10/2020 1051   CALCIUM 8.7 03/07/2013 0333   GFRNONAA 66 11/10/2020 1051   GFRNONAA >60 03/07/2013 0333   GFRAA 76 11/10/2020 1051   GFRAA >60 03/07/2013 0333   CrCl cannot be calculated (Unknown ideal weight.).  COAG Lab Results  Component Value Date   INR 1.0 04/07/2019    Radiology DG Foot Complete Right  Result Date: 11/09/2020 Please see detailed radiograph report in office note.    Assessment/Plan 1. Peripheral arterial disease (HCC)  Recommend:  The patient has evidence of atherosclerosis of the lower extremities with claudication.  The patient does not voice lifestyle limiting changes at this point in time.  Noninvasive studies do not suggest clinically significant change.  No invasive studies, angiography or surgery at this time The patient should continue walking and begin a more formal exercise program.  The patient should continue antiplatelet therapy and aggressive treatment of the lipid abnormalities  No changes in the patient's medications at this time  The patient should continue wearing graduated compression socks 10-15 mmHg strength to control the mild edema.   - VAS [place] ABI WITH/WO TBI; Future  2. Essential hypertension with goal blood pressure less than 140/90 Continue antihypertensive medications as already ordered, these medications have been reviewed and there are no changes at this time.   3. Type 2 diabetes mellitus with other circulatory complication, with  long-term current use of insulin (HCC) Continue hypoglycemic medications as already ordered, these medications have been reviewed and there are no changes at this time.  Hgb A1C to be monitored as already arranged by primary service   4. Mixed hyperlipidemia Continue statin as ordered and reviewed, no changes at this time   5. Inflammatory arthritis Continue NSAID medications as already ordered, these medications have been reviewed and there are no changes at this time.  Continued activity and therapy was stressed.     Hortencia Pilar, MD  11/22/2020 4:37 PM

## 2020-11-23 ENCOUNTER — Ambulatory Visit (INDEPENDENT_AMBULATORY_CARE_PROVIDER_SITE_OTHER): Payer: Medicare Other

## 2020-11-23 ENCOUNTER — Ambulatory Visit (INDEPENDENT_AMBULATORY_CARE_PROVIDER_SITE_OTHER): Payer: Medicare Other | Admitting: Vascular Surgery

## 2020-11-23 ENCOUNTER — Other Ambulatory Visit: Payer: Self-pay

## 2020-11-23 ENCOUNTER — Encounter (INDEPENDENT_AMBULATORY_CARE_PROVIDER_SITE_OTHER): Payer: Self-pay | Admitting: Vascular Surgery

## 2020-11-23 VITALS — BP 152/77 | HR 102 | Resp 16 | Ht 69.0 in | Wt 255.6 lb

## 2020-11-23 DIAGNOSIS — E1159 Type 2 diabetes mellitus with other circulatory complications: Secondary | ICD-10-CM

## 2020-11-23 DIAGNOSIS — Z794 Long term (current) use of insulin: Secondary | ICD-10-CM

## 2020-11-23 DIAGNOSIS — I10 Essential (primary) hypertension: Secondary | ICD-10-CM | POA: Diagnosis not present

## 2020-11-23 DIAGNOSIS — M1380 Other specified arthritis, unspecified site: Secondary | ICD-10-CM

## 2020-11-23 DIAGNOSIS — M1990 Unspecified osteoarthritis, unspecified site: Secondary | ICD-10-CM

## 2020-11-23 DIAGNOSIS — E782 Mixed hyperlipidemia: Secondary | ICD-10-CM

## 2020-11-23 DIAGNOSIS — I739 Peripheral vascular disease, unspecified: Secondary | ICD-10-CM | POA: Diagnosis not present

## 2020-11-24 ENCOUNTER — Ambulatory Visit: Payer: Medicare Other | Admitting: Podiatry

## 2021-02-26 ENCOUNTER — Other Ambulatory Visit: Payer: Self-pay | Admitting: Family Medicine

## 2021-02-26 DIAGNOSIS — R1084 Generalized abdominal pain: Secondary | ICD-10-CM

## 2021-02-26 DIAGNOSIS — R197 Diarrhea, unspecified: Secondary | ICD-10-CM

## 2021-03-11 ENCOUNTER — Other Ambulatory Visit: Payer: Self-pay

## 2021-03-11 ENCOUNTER — Ambulatory Visit
Admission: RE | Admit: 2021-03-11 | Discharge: 2021-03-11 | Disposition: A | Discharge status: Home / Self Care | Payer: Medicare Other | Source: Ambulatory Visit | Attending: Family Medicine | Admitting: Family Medicine

## 2021-03-11 DIAGNOSIS — R1084 Generalized abdominal pain: Secondary | ICD-10-CM | POA: Diagnosis present

## 2021-03-11 DIAGNOSIS — R197 Diarrhea, unspecified: Secondary | ICD-10-CM | POA: Insufficient documentation

## 2021-03-16 ENCOUNTER — Other Ambulatory Visit: Payer: Self-pay | Admitting: Podiatry

## 2021-04-01 ENCOUNTER — Ambulatory Visit: Payer: Medicare Other | Admitting: Podiatry

## 2021-04-01 ENCOUNTER — Other Ambulatory Visit: Payer: Self-pay

## 2021-04-01 ENCOUNTER — Encounter: Payer: Self-pay | Admitting: Podiatry

## 2021-04-01 VITALS — BP 152/84 | HR 81 | Temp 98.1°F

## 2021-04-01 DIAGNOSIS — M7742 Metatarsalgia, left foot: Secondary | ICD-10-CM

## 2021-04-01 DIAGNOSIS — E1149 Type 2 diabetes mellitus with other diabetic neurological complication: Secondary | ICD-10-CM

## 2021-04-01 DIAGNOSIS — I739 Peripheral vascular disease, unspecified: Secondary | ICD-10-CM | POA: Diagnosis not present

## 2021-04-05 NOTE — Progress Notes (Signed)
Subjective: 60 year old male presents the office today for follow-up evaluation of left foot pain.  States that his pain is about the same.  Gets pain in both feet but the left sides worse than right.  Denies any ulcerations.  Pain gets worse with prolonged walking or standing.  No recent injury or trauma.  Objective: AAO x3, NAD DP/PT pulses palpable bilaterally, CRT less than 3 seconds Majority discomfort on the left foot submetatarsal 5.  Somewhat limited on the right foot but not as symptomatic.  There is no edema, erythema.  There is no other areas of discomfort identified today.  Prominent metatarsal heads plantarly with atrophy at that time. No pain with calf compression, swelling, warmth, erythema  Assessment: Capsulitis, metatarsalgia  Plan: -All treatment options discussed with the patient including all alternatives, risks, complications.  -I added metatarsal pad to sensor.  Discussed modifications.  Offered steroid injection we decided to hold off on this today.  Discussed about daily foot inspection and glucose control.  Trula Slade DPM

## 2021-07-12 ENCOUNTER — Other Ambulatory Visit: Payer: Self-pay | Admitting: Podiatry

## 2021-10-07 ENCOUNTER — Ambulatory Visit: Payer: Medicare Other | Admitting: Podiatry

## 2021-10-07 ENCOUNTER — Other Ambulatory Visit: Payer: Self-pay

## 2021-10-07 DIAGNOSIS — M7742 Metatarsalgia, left foot: Secondary | ICD-10-CM | POA: Diagnosis not present

## 2021-10-07 DIAGNOSIS — I739 Peripheral vascular disease, unspecified: Secondary | ICD-10-CM | POA: Diagnosis not present

## 2021-10-07 DIAGNOSIS — E1149 Type 2 diabetes mellitus with other diabetic neurological complication: Secondary | ICD-10-CM | POA: Diagnosis not present

## 2021-10-07 DIAGNOSIS — E119 Type 2 diabetes mellitus without complications: Secondary | ICD-10-CM

## 2021-10-11 NOTE — Progress Notes (Signed)
Subjective: 61 year old male presents the office today for follow-up evaluation of left foot pain and for diabetic foot evaluation, neuropathy.  He states the gabapentin is still helping.  No recent injury or changes otherwise since I last saw him he has no new concerns today.   Last A1c on October 06, 2021 with 7.5.  Objective: AAO x3, NAD DP/PT pulses palpable 1/4 bilateral, CRT less than 3 seconds Chronic appearing edema present bilateral lower extremities. Not able to elicit any area pinpoint tenderness.  Does get discomfort left foot submetatarsal 5.  Did similar symptoms on the right foot but nonsymptomatic.  There is no open lesions bilaterally.  No significant edema, erythema. No pain with calf compression, swelling, warmth, erythema  Assessment: Type 2 diabetes with neuropathy, PAD  Plan: -All treatment options discussed with the patient including all alternatives, risks, complications.  -Discussed and presented for inspection, glucose control.  He has follow-up with vascular surgery previously for PAD.  Continue shoes and good arch support.  Trula Slade DPM

## 2021-11-18 ENCOUNTER — Other Ambulatory Visit: Payer: Self-pay | Admitting: Podiatry

## 2021-11-18 NOTE — Telephone Encounter (Signed)
Please advise 

## 2021-11-22 ENCOUNTER — Ambulatory Visit (INDEPENDENT_AMBULATORY_CARE_PROVIDER_SITE_OTHER): Payer: Medicare Other

## 2021-11-22 ENCOUNTER — Other Ambulatory Visit: Payer: Self-pay

## 2021-11-22 ENCOUNTER — Encounter (INDEPENDENT_AMBULATORY_CARE_PROVIDER_SITE_OTHER): Payer: Self-pay | Admitting: Vascular Surgery

## 2021-11-22 ENCOUNTER — Ambulatory Visit (INDEPENDENT_AMBULATORY_CARE_PROVIDER_SITE_OTHER): Payer: Medicare Other | Admitting: Vascular Surgery

## 2021-11-22 VITALS — BP 151/78 | HR 93 | Resp 16 | Wt 254.8 lb

## 2021-11-22 DIAGNOSIS — E1159 Type 2 diabetes mellitus with other circulatory complications: Secondary | ICD-10-CM

## 2021-11-22 DIAGNOSIS — I10 Essential (primary) hypertension: Secondary | ICD-10-CM

## 2021-11-22 DIAGNOSIS — I70213 Atherosclerosis of native arteries of extremities with intermittent claudication, bilateral legs: Secondary | ICD-10-CM

## 2021-11-22 DIAGNOSIS — I739 Peripheral vascular disease, unspecified: Secondary | ICD-10-CM | POA: Diagnosis not present

## 2021-11-22 DIAGNOSIS — Z794 Long term (current) use of insulin: Secondary | ICD-10-CM

## 2021-11-22 DIAGNOSIS — E782 Mixed hyperlipidemia: Secondary | ICD-10-CM

## 2021-11-22 DIAGNOSIS — I872 Venous insufficiency (chronic) (peripheral): Secondary | ICD-10-CM | POA: Diagnosis not present

## 2021-11-22 NOTE — Progress Notes (Signed)
MRN : [id number]  Chase Harris is a 61 y.o. ([birth year]) male who presents with chief complaint of check circulation.  History of Present Illness:   The patient is seen for evaluation of painful lower extremities and diminished pulses. Patient notes the pain is always associated with activity and is very consistent day today. Typically, the pain occurs at less than one block, progress is as activity continues to the point that the patient must stop walking. Resting including standing still for several minutes allowed resumption of the activity and the ability to walk a similar distance before stopping again. Uneven terrain and inclined shorten the distance. The pain has been progressive over the past several years. The patient states the inability to walk is now having a profound negative impact on quality of life and daily activities.   The patient denies rest pain or dangling of an extremity off the side of the bed during the night for relief. No open wounds or sores at this time. No prior interventions or surgeries.   No history of back problems or DJD of the lumbar sacral spine.    The patient denies changes in claudication symptoms or new rest pain symptoms.  No new ulcers or wounds of the foot.   The patient's blood pressure has been stable and relatively well controlled. The patient denies amaurosis fugax or recent TIA symptoms. There are no recent neurological changes noted. The patient denies history of DVT, PE or superficial thrombophlebitis. The patient denies recent episodes of angina or shortness of breath.   ABI Rt=1.21 TBI=1.15 triphasic and Lt=[place] TBI=0.93 triphasic  No outpatient medications have been marked as taking for the 11/22/21 encounter (Appointment) with Delana Meyer, Dolores Lory, MD.    Past Medical History:  Diagnosis Date   Arthritis    Constipation    Diabetes mellitus without complication ([place])    Metformin   Diabetic peripheral neuropathy ([place])     GERD (gastroesophageal reflux disease)    History of prostate cancer    Hypertension    Prostate cancer ([place])    Sleep apnea    Stroke [facility])     Past Surgical History:  Procedure Laterality Date   COLONOSCOPY WITH PROPOFOL N/A 06/27/2018   Procedure: COLONOSCOPY WITH PROPOFOL;  Surgeon: Manya Silvas, MD;  Location: [facility] ENDOSCOPY;  Service: Endoscopy;  Laterality: N/A;   ESOPHAGOGASTRODUODENOSCOPY (EGD) WITH PROPOFOL N/A 06/27/2018   Procedure: ESOPHAGOGASTRODUODENOSCOPY (EGD) WITH PROPOFOL;  Surgeon: Manya Silvas, MD;  Location: [facility] ENDOSCOPY;  Service: Endoscopy;  Laterality: N/A;   HERNIA REPAIR     PROSTATECTOMY     TAYLOR BUNIONECTOMY     WISDOM TOOTH EXTRACTION      Social History Social History   Tobacco Use   Smoking status: Former   Smokeless tobacco: Never  Scientific laboratory technician Use: Never used  Substance Use Topics   Alcohol use: Yes    Alcohol/week: 4.0 standard drinks    Types: 2 Cans of beer, 2 Shots of liquor per week   Drug use: No    Family History Family History  Problem Relation Age of Onset   Congestive Heart Failure Father    Hypertension Father    Kidney disease Father    Diabetes Paternal Grandmother    Diabetes Paternal Aunt    Hypothyroidism Mother     Allergies  Allergen Reactions   Penicillins Rash    At birth   Doxycycline     Unknown - pt informed pharmacist  02/17/2017.   Dynabac [Dirithromycin] Nausea And Vomiting    Other reaction(s): Abdominal Pain     REVIEW OF SYSTEMS (Negative unless checked)  Constitutional: [] Weight loss  [] Fever  [] Chills Cardiac: [] Chest pain   [] Chest pressure   [] Palpitations   [] Shortness of breath when laying flat   [] Shortness of breath with exertion. Vascular:  [x] Pain in legs with walking   [] Pain in legs at rest  [] History of DVT   [] Phlebitis   [] Swelling in legs   [] Varicose veins   [] Non-healing ulcers Pulmonary:   [] Uses home oxygen   [] Productive cough   [] Hemoptysis   [] Wheeze   [] COPD   [] Asthma Neurologic:  [] Dizziness   [] Seizures   [] History of stroke   [] History of TIA  [] Aphasia   [] Vissual changes   [] Weakness or numbness in arm   [] Weakness or numbness in leg Musculoskeletal:   [] Joint swelling   [] Joint pain   [] Low back pain Hematologic:  [] Easy bruising  [] Easy bleeding   [] Hypercoagulable state   [] Anemic Gastrointestinal:  [] Diarrhea   [] Vomiting  [] Gastroesophageal reflux/heartburn   [] Difficulty swallowing. Genitourinary:  [] Chronic kidney disease   [] Difficult urination  [] Frequent urination   [] Blood in urine Skin:  [x] Rashes   [] Ulcers  Psychological:  [] History of anxiety   []  History of major depression.  Physical Examination  There were no vitals filed for this visit. There is no height or weight on file to calculate BMI. Gen: WD/WN, NAD Head: /AT, No temporalis wasting.  Ear/Nose/Throat: Hearing grossly intact, nares w/o erythema or drainage Eyes: PER, EOMI, sclera nonicteric.  Neck: Supple, no masses.  No bruit or JVD.  Pulmonary:  Good air movement, no audible wheezing, no use of accessory muscles.  Cardiac: RRR, normal S1, S2, no Murmurs. Vascular:  scattered varicosities present bilaterally.  Mild venous stasis changes to the legs bilaterally.  trace soft pitting edema  Vessel Right Left  Radial Palpable Palpable  PT Palpable Palpable  DP Not Palpable Palpable  Gastrointestinal: soft, non-distended. No guarding/no peritoneal signs.  Musculoskeletal: M/S 5/5 throughout.  No visible deformity.  Neurologic: CN 2-12 intact. Pain and light touch intact in extremities.  Symmetrical.  Speech is fluent. Motor exam as listed above. Psychiatric: Judgment intact, Mood & affect appropriate for pt's clinical situation. Dermatologic: Venous rashes no ulcers noted.  No changes consistent with cellulitis.   CBC Lab Results  Component Value Date   WBC 9.8 11/10/2020   HGB 15.7 11/10/2020   HCT 48.6 11/10/2020   MCV 85 11/10/2020   PLT 253  11/10/2020    BMET    Component Value Date/Time   NA 141 11/10/2020 1051   NA 138 03/07/2013 0333   K 4.7 11/10/2020 1051   K 3.8 03/07/2013 0333   CL 97 11/10/2020 1051   CL 103 03/07/2013 0333   CO2 25 11/10/2020 1051   CO2 28 03/07/2013 0333   GLUCOSE 176 (H) 11/10/2020 1051   GLUCOSE 156 (H) 04/08/2019 0400   GLUCOSE 221 (H) 03/07/2013 0333   BUN 21 11/10/2020 1051   BUN 18 03/07/2013 0333   CREATININE 1.19 11/10/2020 1051   CREATININE 1.04 03/07/2013 0333   CALCIUM 10.2 11/10/2020 1051   CALCIUM 8.7 03/07/2013 0333   GFRNONAA 66 11/10/2020 1051   GFRNONAA >60 03/07/2013 0333   GFRAA 76 11/10/2020 1051   GFRAA >60 03/07/2013 0333   CrCl cannot be calculated (Patient's most recent lab result is older than the maximum 21 days allowed.).  COAG  Lab Results  Component Value Date   INR 1.0 04/07/2019    Radiology No results found.   Assessment/Plan 1. Atherosclerosis of native artery of both lower extremities with intermittent claudication (HCC)  Recommend:  The patient has evidence of atherosclerosis of the lower extremities with claudication.  The patient does not voice lifestyle limiting changes at this point in time.  Noninvasive studies do not suggest clinically significant change.  No invasive studies, angiography or surgery at this time The patient should continue walking and begin a more formal exercise program.  The patient should continue antiplatelet therapy and aggressive treatment of the lipid abnormalities  No changes in the patient's medications at this time  The patient should continue wearing graduated compression socks 10-15 mmHg strength to control the mild edema.   - VAS [place] ABI WITH/WO TBI; Future  2. Chronic venous insufficiency No surgery or intervention at this point in time.    I have reviewed my discussion with the patient regarding venous insufficiency and secondary lymph edema and why it  causes symptoms. I have discussed with the  patient the chronic skin changes that accompany these problems and the long term sequela such as ulceration and infection.  Patient will continue wearing graduated compression stockings class 1 (20-30 mmHg) on a daily basis a prescription was given to the patient to keep this updated. The patient will  put the stockings on first thing in the morning and removing them in the evening. The patient is instructed specifically not to sleep in the stockings.  In addition, behavioral modification including elevation during the day will be continued.  Diet and salt restriction was also discussed.  Previous duplex ultrasound of the lower extremities shows normal deep venous system, superficial reflux was not present.   Following the review of the ultrasound the patient will follow up in 12 months to reassess the degree of swelling and the control that graduated compression is offering.   The patient can be assessed for a Lymph Pump at that time.  However, at this time the patient states they are satisfied with the control compression and elevation is yielding.    3. Essential hypertension with goal blood pressure less than 140/90 Continue antihypertensive medications as already ordered, these medications have been reviewed and there are no changes at this time.   4. Type 2 diabetes mellitus with other circulatory complication, with long-term current use of insulin (HCC) Continue hypoglycemic medications as already ordered, these medications have been reviewed and there are no changes at this time.  Hgb A1C to be monitored as already arranged by primary service   5. Mixed hyperlipidemia Continue statin as ordered and reviewed, no changes at this time     Hortencia Pilar, MD  11/22/2021 8:19 AM

## 2022-04-06 ENCOUNTER — Other Ambulatory Visit: Payer: Self-pay | Admitting: Podiatry

## 2022-04-07 ENCOUNTER — Ambulatory Visit: Payer: Medicare Other | Admitting: Podiatry

## 2022-05-03 ENCOUNTER — Ambulatory Visit: Payer: Medicare Other | Admitting: Podiatry

## 2022-05-03 DIAGNOSIS — B351 Tinea unguium: Secondary | ICD-10-CM

## 2022-05-03 DIAGNOSIS — M79674 Pain in right toe(s): Secondary | ICD-10-CM

## 2022-05-03 DIAGNOSIS — E1149 Type 2 diabetes mellitus with other diabetic neurological complication: Secondary | ICD-10-CM

## 2022-05-03 DIAGNOSIS — M79675 Pain in left toe(s): Secondary | ICD-10-CM | POA: Diagnosis not present

## 2022-05-03 DIAGNOSIS — I739 Peripheral vascular disease, unspecified: Secondary | ICD-10-CM

## 2022-05-03 NOTE — Progress Notes (Unsigned)
Subjective: Chief Complaint  Patient presents with   Diabetes    Diabetic foot care, A1c- 7.6, BG- 145, Pt is doing well, TX: gabapentin    Patient presents with the above complaint. Aside from the neuropathy he is doing well. No open lesions.  He is having difficulty trimming his nails are getting thicker and elongated.  No swelling redness or any drainage. Last A1c was 7.6 on 05/02/2022  Objective: AAO x3, NAD DP/PT pulses palpable 1/4 bilateral, CRT less than 3 seconds Chronic appearing edema present bilateral lower extremities. Nails are mildly hypertrophic, dystrophic with yellow discoloration.  Does get tenderness nails 1-5 bilaterally. Not able to elicit any area pinpoint tenderness.  Does get discomfort left foot submetatarsal 5.  Did similar symptoms on the right foot but nonsymptomatic.  There is no open lesions bilaterally.  No significant edema, erythema. No pain with calf compression, swelling, warmth, erythema  Assessment: Type 2 diabetes with neuropathy, symptomatic onychomycosis, PAD  Plan: -All treatment options discussed with the patient including all alternatives, risks, complications.  -Nails sharply debrided x 10 without any complications or bleeding -Continue current dose of gabapentin -Daily foot inspection  Trula Slade DPM

## 2022-07-25 ENCOUNTER — Encounter (INDEPENDENT_AMBULATORY_CARE_PROVIDER_SITE_OTHER): Payer: Self-pay

## 2022-07-30 ENCOUNTER — Ambulatory Visit (INDEPENDENT_AMBULATORY_CARE_PROVIDER_SITE_OTHER): Payer: Medicare Other

## 2022-07-30 ENCOUNTER — Ambulatory Visit: Payer: Medicare Other | Admitting: Podiatry

## 2022-07-30 DIAGNOSIS — M778 Other enthesopathies, not elsewhere classified: Secondary | ICD-10-CM

## 2022-07-30 DIAGNOSIS — M7742 Metatarsalgia, left foot: Secondary | ICD-10-CM

## 2022-07-30 NOTE — Patient Instructions (Signed)

## 2022-07-30 NOTE — Progress Notes (Unsigned)
Subjective: Chief Complaint  Patient presents with   Foot Pain    Left foot pain, possible gout - pain is in the top of the foot around the big toe joint   61 year old male presents the above concerns.  He states that over the last 2 weeks and he started having pain to his left foot and thought he had a gout flare.  He got to the point where he is unable to even put weight on the foot.  It is getting better on its own.  No pain reported the top of the foot.  No injuries that he reports when this started.  No treatment.  Objective: AAO x3, NAD DP/PT pulses palpable bilaterally, CRT less than 3 seconds Majority tenderness is localized on the first metatarsal cuneiform joint of the left foot there is trace edema.  No significant erythema or warmth noted at this area today.  Flexor, extensor tendons appear to be intact.  MMT 5/5.  No pain with calf compression, swelling, warmth, erythema  Assessment: Capsulitis, possible gout left foot  Plan: -All treatment options discussed with the patient including all alternatives, risks, complications.   -Patient encouraged to call the office with any questions, concerns, change in symptoms.  -X-rays obtained reviewed left foot.  3 views were obtained.  No evidence of acute fracture. -Steroid injection performed the first metatarsal cuneiform joint.  Skin was cleaned with alcohol and mixture of 0.5 cc of dexamethasone phosphate, the point 5 cc of Marcaine plain was infiltrated into the area of maximal tenderness without complications away from the extensor tendons.  Postinjection care discussed.  Tolerated well.  Specimen sent to the next couple days. -Continue supportive shoes.  Trula Slade DPM

## 2022-10-04 ENCOUNTER — Other Ambulatory Visit: Payer: Self-pay | Admitting: Podiatry

## 2022-10-11 ENCOUNTER — Encounter: Payer: Self-pay | Admitting: Podiatry

## 2022-11-01 ENCOUNTER — Ambulatory Visit: Payer: Medicare Other | Admitting: Podiatry

## 2022-11-01 DIAGNOSIS — E1149 Type 2 diabetes mellitus with other diabetic neurological complication: Secondary | ICD-10-CM | POA: Diagnosis not present

## 2022-11-01 DIAGNOSIS — M216X1 Other acquired deformities of right foot: Secondary | ICD-10-CM

## 2022-11-01 DIAGNOSIS — M7742 Metatarsalgia, left foot: Secondary | ICD-10-CM | POA: Diagnosis not present

## 2022-11-01 DIAGNOSIS — M216X2 Other acquired deformities of left foot: Secondary | ICD-10-CM | POA: Diagnosis not present

## 2022-11-01 DIAGNOSIS — E119 Type 2 diabetes mellitus without complications: Secondary | ICD-10-CM

## 2022-11-06 NOTE — Progress Notes (Signed)
Subjective: No chief complaint on file.   62 year old male presents the above concerns.  States he has been doing well this time and has not had any new concerns.  Neuropathy has been about stable no recent injury or changes.  No open lesions.   Objective: AAO x3, NAD DP/PT pulses palpable bilaterally, CRT less than 3 seconds Nails are mildly elongated but not significantly not causing significant pain.  No edema, erythema or signs of infection.  There is no open lesions.  He gets chronic discomfort submetatarsal 5 bilaterally there is no open lesions or callus formation.  There is prominence of metatarsal heads plantarly with atrophy the fat pad.  MMT 5/5. No pain with calf compression, swelling, warmth, erythema  Assessment: Capsulitis, possible gout left foot  Plan: -All treatment options discussed with the patient including all alternatives, risks, complications.   -Patient encouraged to call the office with any questions, concerns, change in symptoms.  -As a courtesy I debrided a few of the nails that were mildly elongated without any complications or bleeding.  He seems to be doing well.  Discussed continue shoes with good arch supports and daily foot inspection.  Glucose control.  He is previously follow-up with vascular surgery regards to her circulation as well.  Neuropathy appears to be stable.  Will continue to monitor.  Trula Slade DPM

## 2022-11-20 NOTE — Progress Notes (Unsigned)
MRN : [id number]  Chase Harris is a 62 y.o. (04-27-1961) male who presents with chief complaint of check circulation.  History of Present Illness:   The patient returns to the office for followup and review of the noninvasive studies.   There have been no interval changes in lower extremity symptoms. No interval shortening of the patient's claudication distance or development of rest pain symptoms. No new ulcers or wounds have occurred since the last visit.  There have been no significant changes to the patient's overall health care.  The patient denies amaurosis fugax or recent TIA symptoms. There are no documented recent neurological changes noted. There is no history of DVT, PE or superficial thrombophlebitis. The patient denies recent episodes of angina or shortness of breath.   ABI Rt=*** and Lt=***  (previous ABI's Rt=*** and Lt=***) Duplex ultrasound of the ***  No outpatient medications have been marked as taking for the 11/21/22 encounter (Appointment) with Delana Meyer, Dolores Lory, MD.    Past Medical History:  Diagnosis Date   Arthritis    Constipation    Diabetes mellitus without complication ([place])    Metformin   Diabetic peripheral neuropathy ([place])    GERD (gastroesophageal reflux disease)    History of prostate cancer    Hypertension    Prostate cancer ([place])    Sleep apnea    Stroke [facility])     Past Surgical History:  Procedure Laterality Date   COLONOSCOPY WITH PROPOFOL N/A 06/27/2018   Procedure: COLONOSCOPY WITH PROPOFOL;  Surgeon: Manya Silvas, MD;  Location: [facility] ENDOSCOPY;  Service: Endoscopy;  Laterality: N/A;   ESOPHAGOGASTRODUODENOSCOPY (EGD) WITH PROPOFOL N/A 06/27/2018   Procedure: ESOPHAGOGASTRODUODENOSCOPY (EGD) WITH PROPOFOL;  Surgeon: Manya Silvas, MD;  Location: [facility] ENDOSCOPY;  Service: Endoscopy;  Laterality: N/A;   HERNIA REPAIR     PROSTATECTOMY     TAYLOR BUNIONECTOMY     WISDOM TOOTH EXTRACTION      Social  History Social History   Tobacco Use   Smoking status: Former   Smokeless tobacco: Never  Scientific laboratory technician Use: Never used  Substance Use Topics   Alcohol use: Yes    Alcohol/week: 4.0 standard drinks of alcohol    Types: 2 Cans of beer, 2 Shots of liquor per week   Drug use: No    Family History Family History  Problem Relation Age of Onset   Congestive Heart Failure Father    Hypertension Father    Kidney disease Father    Diabetes Paternal Grandmother    Diabetes Paternal Aunt    Hypothyroidism Mother     Allergies  Allergen Reactions   Doxycycline Hives    Unknown - pt informed pharmacist 02/17/2017. Previously reported diarrhea, reported by pt. 01/11/21 reaction of Hives.    Penicillins Rash    At birth   Dynabac [Dirithromycin] Nausea And Vomiting    Other reaction(s): Abdominal Pain     REVIEW OF SYSTEMS (Negative unless checked)  Constitutional: '[]'$ Weight loss  '[]'$ Fever  '[]'$ Chills Cardiac: '[]'$ Chest pain   '[]'$ Chest pressure   '[]'$ Palpitations   '[]'$ Shortness of breath when laying flat   '[]'$ Shortness of breath with exertion. Vascular:  '[x]'$ Pain in legs with walking   '[]'$ Pain in legs at rest  '[]'$ History of DVT   '[]'$ Phlebitis   '[]'$ Swelling in legs   '[]'$ Varicose veins   '[]'$ Non-healing ulcers Pulmonary:   '[]'$ Uses home oxygen   '[]'$   Productive cough   '[]'$ Hemoptysis   '[]'$ Wheeze  '[]'$ COPD   '[]'$ Asthma Neurologic:  '[]'$ Dizziness   '[]'$ Seizures   '[]'$ History of stroke   '[]'$ History of TIA  '[]'$ Aphasia   '[]'$ Vissual changes   '[]'$ Weakness or numbness in arm   '[]'$ Weakness or numbness in leg Musculoskeletal:   '[]'$ Joint swelling   '[]'$ Joint pain   '[]'$ Low back pain Hematologic:  '[]'$ Easy bruising  '[]'$ Easy bleeding   '[]'$ Hypercoagulable state   '[]'$ Anemic Gastrointestinal:  '[]'$ Diarrhea   '[]'$ Vomiting  '[]'$ Gastroesophageal reflux/heartburn   '[]'$ Difficulty swallowing. Genitourinary:  '[]'$ Chronic kidney disease   '[]'$ Difficult urination  '[]'$ Frequent urination   '[]'$ Blood in urine Skin:  '[]'$ Rashes   '[]'$ Ulcers  Psychological:  '[]'$ History of  anxiety   '[]'$  History of major depression.  Physical Examination  There were no vitals filed for this visit. There is no height or weight on file to calculate BMI. Gen: WD/WN, NAD Head: [place]/AT, No temporalis wasting.  Ear/Nose/Throat: Hearing grossly intact, nares w/o erythema or drainage Eyes: PER, EOMI, sclera nonicteric.  Neck: Supple, no masses.  No bruit or JVD.  Pulmonary:  Good air movement, no audible wheezing, no use of accessory muscles.  Cardiac: RRR, normal S1, S2, no Murmurs. Vascular:  mild trophic changes, no open wounds Vessel Right Left  Radial Palpable Palpable  PT Not Palpable Not Palpable  DP Not Palpable Not Palpable  Gastrointestinal: soft, non-distended. No guarding/no peritoneal signs.  Musculoskeletal: M/S 5/5 throughout.  No visible deformity.  Neurologic: CN 2-12 intact. Pain and light touch intact in extremities.  Symmetrical.  Speech is fluent. Motor exam as listed above. Psychiatric: Judgment intact, Mood & affect appropriate for pt's clinical situation. Dermatologic: No rashes or ulcers noted.  No changes consistent with cellulitis.   CBC Lab Results  Component Value Date   WBC 9.8 11/10/2020   HGB 15.7 11/10/2020   HCT 48.6 11/10/2020   MCV 85 11/10/2020   PLT 253 11/10/2020    BMET    Component Value Date/Time   NA 141 11/10/2020 1051   NA 138 03/07/2013 0333   K 4.7 11/10/2020 1051   K 3.8 03/07/2013 0333   CL 97 11/10/2020 1051   CL 103 03/07/2013 0333   CO2 25 11/10/2020 1051   CO2 28 03/07/2013 0333   GLUCOSE 176 (H) 11/10/2020 1051   GLUCOSE 156 (H) 04/08/2019 0400   GLUCOSE 221 (H) 03/07/2013 0333   BUN 21 11/10/2020 1051   BUN 18 03/07/2013 0333   CREATININE 1.19 11/10/2020 1051   CREATININE 1.04 03/07/2013 0333   CALCIUM 10.2 11/10/2020 1051   CALCIUM 8.7 03/07/2013 0333   GFRNONAA 66 11/10/2020 1051   GFRNONAA >60 03/07/2013 0333   GFRAA 76 11/10/2020 1051   GFRAA >60 03/07/2013 0333   CrCl cannot be calculated  (Patient's most recent lab result is older than the maximum 21 days allowed.).  COAG Lab Results  Component Value Date   INR 1.0 04/07/2019    Radiology No results found.   Assessment/Plan There are no diagnoses linked to this encounter.   Hortencia Pilar, MD  11/20/2022 9:52 AM

## 2022-11-21 ENCOUNTER — Encounter (INDEPENDENT_AMBULATORY_CARE_PROVIDER_SITE_OTHER): Payer: Self-pay | Admitting: Vascular Surgery

## 2022-11-21 ENCOUNTER — Ambulatory Visit (INDEPENDENT_AMBULATORY_CARE_PROVIDER_SITE_OTHER): Payer: Medicare Other | Admitting: Vascular Surgery

## 2022-11-21 ENCOUNTER — Ambulatory Visit (INDEPENDENT_AMBULATORY_CARE_PROVIDER_SITE_OTHER): Payer: Medicare Other

## 2022-11-21 VITALS — BP 145/78 | HR 93 | Resp 16 | Wt 264.4 lb

## 2022-11-21 DIAGNOSIS — I10 Essential (primary) hypertension: Secondary | ICD-10-CM

## 2022-11-21 DIAGNOSIS — I872 Venous insufficiency (chronic) (peripheral): Secondary | ICD-10-CM

## 2022-11-21 DIAGNOSIS — I70213 Atherosclerosis of native arteries of extremities with intermittent claudication, bilateral legs: Secondary | ICD-10-CM | POA: Diagnosis not present

## 2022-11-21 DIAGNOSIS — M1712 Unilateral primary osteoarthritis, left knee: Secondary | ICD-10-CM | POA: Diagnosis not present

## 2022-11-21 DIAGNOSIS — Z794 Long term (current) use of insulin: Secondary | ICD-10-CM

## 2022-11-21 DIAGNOSIS — E782 Mixed hyperlipidemia: Secondary | ICD-10-CM

## 2022-11-21 DIAGNOSIS — E1159 Type 2 diabetes mellitus with other circulatory complications: Secondary | ICD-10-CM

## 2022-11-22 ENCOUNTER — Encounter (INDEPENDENT_AMBULATORY_CARE_PROVIDER_SITE_OTHER): Payer: Self-pay | Admitting: Vascular Surgery

## 2022-11-22 DIAGNOSIS — M1712 Unilateral primary osteoarthritis, left knee: Secondary | ICD-10-CM | POA: Insufficient documentation

## 2022-11-23 ENCOUNTER — Other Ambulatory Visit: Payer: Self-pay

## 2022-11-23 DIAGNOSIS — G4733 Obstructive sleep apnea (adult) (pediatric): Secondary | ICD-10-CM

## 2022-11-23 DIAGNOSIS — R7401 Elevation of levels of liver transaminase levels: Secondary | ICD-10-CM

## 2022-11-23 DIAGNOSIS — K760 Fatty (change of) liver, not elsewhere classified: Secondary | ICD-10-CM

## 2022-11-23 LAB — VAS US ABI WITH/WO TBI

## 2022-12-06 ENCOUNTER — Ambulatory Visit
Admission: RE | Admit: 2022-12-06 | Discharge: 2022-12-06 | Disposition: A | Discharge status: Home / Self Care | Payer: Medicare Other | Source: Ambulatory Visit | Attending: Gastroenterology | Admitting: Gastroenterology

## 2022-12-06 DIAGNOSIS — R7401 Elevation of levels of liver transaminase levels: Secondary | ICD-10-CM | POA: Insufficient documentation

## 2022-12-06 DIAGNOSIS — G4733 Obstructive sleep apnea (adult) (pediatric): Secondary | ICD-10-CM

## 2022-12-06 DIAGNOSIS — K760 Fatty (change of) liver, not elsewhere classified: Secondary | ICD-10-CM | POA: Diagnosis present

## 2022-12-08 ENCOUNTER — Ambulatory Visit (INDEPENDENT_AMBULATORY_CARE_PROVIDER_SITE_OTHER): Payer: Medicare Other | Admitting: Vascular Surgery

## 2022-12-08 ENCOUNTER — Encounter (INDEPENDENT_AMBULATORY_CARE_PROVIDER_SITE_OTHER): Payer: Medicare Other

## 2023-01-05 ENCOUNTER — Encounter (INDEPENDENT_AMBULATORY_CARE_PROVIDER_SITE_OTHER): Payer: Medicare Other

## 2023-01-05 ENCOUNTER — Ambulatory Visit (INDEPENDENT_AMBULATORY_CARE_PROVIDER_SITE_OTHER): Payer: Medicare Other | Admitting: Vascular Surgery

## 2023-01-20 ENCOUNTER — Other Ambulatory Visit (INDEPENDENT_AMBULATORY_CARE_PROVIDER_SITE_OTHER): Payer: Self-pay | Admitting: Vascular Surgery

## 2023-01-20 DIAGNOSIS — I70213 Atherosclerosis of native arteries of extremities with intermittent claudication, bilateral legs: Secondary | ICD-10-CM

## 2023-01-22 NOTE — Progress Notes (Signed)
MRN : [id number]  Chase Harris is a 62 y.o. (27-Mar-1961) male who presents with chief complaint of check circulation.  History of Present Illness:   The patient returns to the office for followup and review of the noninvasive studies.    There have been no interval changes in lower extremity symptoms. No interval shortening of the patient's claudication distance or development of rest pain symptoms. No new ulcers or wounds have occurred since the last visit.   There have been no significant changes to the patient's overall health care.  However today he reports that he will be undergoing total knee replacement in the next few months and in [facility] by Dr. Georgiann Hahn.   The patient denies amaurosis fugax or recent TIA symptoms. There are no documented recent neurological changes noted. There is no history of DVT, PE or superficial thrombophlebitis. The patient denies recent episodes of angina or shortness of breath.    Previus ABI's Rt=Chase Harris TBI=0.89 and Lt=Chase Harris TBI=0.73 (previous ABI's Rt=Chase Harris TBI=1.15 and Lt=Chase Harris TBI=0.0.93)  Duplex ultrasound of the left lower extremity arterial system shows uniform velocities throughout with triphasic signals in the tibial vessels  No outpatient medications have been marked as taking for the 01/23/23 encounter (Appointment) with Gilda Crease, Latina Craver, MD.    Past Medical History:  Diagnosis Date   Arthritis    Constipation    Diabetes mellitus without complication (HCC)    Metformin   Diabetic peripheral neuropathy (HCC)    GERD (gastroesophageal reflux disease)    History of prostate cancer    Hypertension    Prostate cancer (HCC)    Sleep apnea    Stroke [facility])     Past Surgical History:  Procedure Laterality Date   COLONOSCOPY WITH PROPOFOL N/A 06/27/2018   Procedure: COLONOSCOPY WITH PROPOFOL;  Surgeon: Scot Jun, MD;  Location: [facility] ENDOSCOPY;  Service: Endoscopy;  Laterality: N/A;   ESOPHAGOGASTRODUODENOSCOPY  (EGD) WITH PROPOFOL N/A 06/27/2018   Procedure: ESOPHAGOGASTRODUODENOSCOPY (EGD) WITH PROPOFOL;  Surgeon: Scot Jun, MD;  Location: [facility] ENDOSCOPY;  Service: Endoscopy;  Laterality: N/A;   HERNIA REPAIR     PROSTATECTOMY     TAYLOR BUNIONECTOMY     WISDOM TOOTH EXTRACTION      Social History Social History   Tobacco Use   Smoking status: Former   Smokeless tobacco: Never  Building services engineer Use: Never used  Substance Use Topics   Alcohol use: Yes    Alcohol/week: 4.0 standard drinks of alcohol    Types: 2 Cans of beer, 2 Shots of liquor per week   Drug use: No    Family History Family History  Problem Relation Age of Onset   Congestive Heart Failure Father    Hypertension Father    Kidney disease Father    Diabetes Paternal Grandmother    Diabetes Paternal Aunt    Hypothyroidism Mother     Allergies  Allergen Reactions   Doxycycline Hives    Unknown - pt informed pharmacist 02/17/2017. Previously reported diarrhea, reported by pt. 01/11/21 reaction of Hives.    Penicillins Rash    At birth   Dynabac [Dirithromycin] Nausea And Vomiting    Other reaction(s): Abdominal Pain     REVIEW OF SYSTEMS (Negative unless checked)  Constitutional: [] Weight loss  [] Fever  [] Chills Cardiac: [] Chest pain   [] Chest pressure   [] Palpitations   [] Shortness of breath when laying flat   []   Shortness of breath with exertion. Vascular:  [x] Pain in legs with walking   [] Pain in legs at rest  [] History of DVT   [] Phlebitis   [] Swelling in legs   [] Varicose veins   [] Non-healing ulcers Pulmonary:   [] Uses home oxygen   [] Productive cough   [] Hemoptysis   [] Wheeze  [] COPD   [] Asthma Neurologic:  [] Dizziness   [] Seizures   [] History of stroke   [] History of TIA  [] Aphasia   [] Vissual changes   [] Weakness or numbness in arm   [] Weakness or numbness in leg Musculoskeletal:   [] Joint swelling   [] Joint pain   [] Low back pain Hematologic:  [] Easy bruising  [] Easy bleeding    [] Hypercoagulable state   [] Anemic Gastrointestinal:  [] Diarrhea   [] Vomiting  [] Gastroesophageal reflux/heartburn   [] Difficulty swallowing. Genitourinary:  [] Chronic kidney disease   [] Difficult urination  [] Frequent urination   [] Blood in urine Skin:  [] Rashes   [] Ulcers  Psychological:  [] History of anxiety   []  History of major depression.  Physical Examination  There were no vitals filed for this visit. There is no height or weight on file to calculate BMI. Gen: WD/WN, NAD Head: [place]/AT, No temporalis wasting.  Ear/Nose/Throat: Hearing grossly intact, nares w/o erythema or drainage Eyes: PER, EOMI, sclera nonicteric.  Neck: Supple, no masses.  No bruit or JVD.  Pulmonary:  Good air movement, no audible wheezing, no use of accessory muscles.  Cardiac: RRR, normal S1, S2, no Murmurs. Vascular:  mild trophic changes, no open wounds;  scattered varicosities present bilaterally.  Moderate venous stasis changes to the legs bilaterally.  2+ soft pitting edema. CEAP [id number]  Vessel Right Left  Radial Palpable Palpable  PT Not Palpable Not Palpable  DP Not Palpable Not Palpable  Gastrointestinal: soft, non-distended. No guarding/no peritoneal signs.  Musculoskeletal: M/S 5/5 throughout.  No visible deformity.  Neurologic: CN 2-12 intact. Pain and light touch intact in extremities.  Symmetrical.  Speech is fluent. Motor exam as listed above. Psychiatric: Judgment intact, Mood & affect appropriate for pt's clinical situation. Dermatologic: No rashes or ulcers noted.  No changes consistent with cellulitis.   CBC Lab Results  Component Value Date   WBC 9.8 11/10/2020   HGB 15.7 11/10/2020   HCT 48.6 11/10/2020   MCV 85 11/10/2020   PLT 253 11/10/2020    BMET    Component Value Date/Time   NA 141 11/10/2020 1051   NA 138 03/07/2013 0333   K 4.7 11/10/2020 1051   K 3.8 03/07/2013 0333   CL 97 11/10/2020 1051   CL 103 03/07/2013 0333   CO2 25 11/10/2020 1051   CO2 28 03/07/2013  0333   GLUCOSE 176 (H) 11/10/2020 1051   GLUCOSE 156 (H) 04/08/2019 0400   GLUCOSE 221 (H) 03/07/2013 0333   BUN 21 11/10/2020 1051   BUN 18 03/07/2013 0333   CREATININE 1.19 11/10/2020 1051   CREATININE 1.04 03/07/2013 0333   CALCIUM 10.2 11/10/2020 1051   CALCIUM 8.7 03/07/2013 0333   GFRNONAA 66 11/10/2020 1051   GFRNONAA >60 03/07/2013 0333   GFRAA 76 11/10/2020 1051   GFRAA >60 03/07/2013 0333   CrCl cannot be calculated (Patient's most recent lab result is older than the maximum 21 days allowed.).  COAG Lab Results  Component Value Date   INR 1.0 04/07/2019    Radiology No results found.   Assessment/Plan 1. Atherosclerosis of native artery of both lower extremities with intermittent claudication (HCC)  Recommend:  The patient has evidence  of atherosclerosis of the lower extremities with claudication.  The patient does not voice lifestyle limiting changes at this point in time.  Noninvasive studies do not suggest clinically significant change.  No invasive studies, angiography or surgery at this time The patient should continue walking and begin a more formal exercise program.  The patient should continue antiplatelet therapy and aggressive treatment of the lipid abnormalities  No changes in the patient's medications at this time  Continued surveillance is indicated as atherosclerosis is likely to progress with time.    The patient will continue follow up with noninvasive studies as ordered.   2. Chronic venous insufficiency Recommend:  No surgery or intervention at this point in time.  I have reviewed my discussion with the patient regarding venous insufficiency and why it causes symptoms. I have discussed with the patient the chronic skin changes that accompany venous insufficiency and the long term sequela such as ulceration. Patient will contnue wearing graduated compression stockings on a daily basis, as this has provided excellent control of his edema.  The patient will put the stockings on first thing in the morning and removing them in the evening. The patient is reminded not to sleep in the stockings.  In addition, behavioral modification including elevation during the day will be initiated. Exercise is strongly encouraged.  Previous duplex ultrasound of the lower extremities shows normal deep system, no significant superficial reflux was identified.  Given the patient's good control and lack of any problems regarding the venous insufficiency and lymphedema a lymph pump in not need at this time.     3. Essential hypertension with goal blood pressure less than 140/90 Continue antihypertensive medications as already ordered, these medications have been reviewed and there are no changes at this time.  4. Type 2 diabetes mellitus with other circulatory complication, with long-term current use of insulin (HCC) Continue hypoglycemic medications as already ordered, these medications have been reviewed and there are no changes at this time.  Hgb A1C to be monitored as already arranged by primary service  5. Mixed hyperlipidemia Continue statin as ordered and reviewed, no changes at this time    Levora Dredge, MD  01/22/2023 2:06 PM

## 2023-01-23 ENCOUNTER — Ambulatory Visit (INDEPENDENT_AMBULATORY_CARE_PROVIDER_SITE_OTHER): Payer: Medicare Other

## 2023-01-23 ENCOUNTER — Encounter (INDEPENDENT_AMBULATORY_CARE_PROVIDER_SITE_OTHER): Payer: Self-pay | Admitting: Vascular Surgery

## 2023-01-23 ENCOUNTER — Ambulatory Visit (INDEPENDENT_AMBULATORY_CARE_PROVIDER_SITE_OTHER): Payer: Medicare Other | Admitting: Vascular Surgery

## 2023-01-23 VITALS — BP 136/73 | HR 104 | Resp 16 | Wt 260.0 lb

## 2023-01-23 DIAGNOSIS — I10 Essential (primary) hypertension: Secondary | ICD-10-CM

## 2023-01-23 DIAGNOSIS — I70213 Atherosclerosis of native arteries of extremities with intermittent claudication, bilateral legs: Secondary | ICD-10-CM

## 2023-01-23 DIAGNOSIS — I872 Venous insufficiency (chronic) (peripheral): Secondary | ICD-10-CM | POA: Diagnosis not present

## 2023-01-23 DIAGNOSIS — E1159 Type 2 diabetes mellitus with other circulatory complications: Secondary | ICD-10-CM

## 2023-01-23 DIAGNOSIS — Z794 Long term (current) use of insulin: Secondary | ICD-10-CM

## 2023-01-23 DIAGNOSIS — E782 Mixed hyperlipidemia: Secondary | ICD-10-CM

## 2023-01-24 NOTE — Progress Notes (Signed)
Surgery orders requested via Epic inbox. °

## 2023-01-25 NOTE — H&P (Signed)
Patient's anticipated LOS is less than 2 midnights, meeting these requirements: - Younger than 57 - Lives within 1 hour of care - Has a competent adult at home to recover with post-op recover - NO history of  - Chronic pain requiring opiods  - Diabetes  - Coronary Artery Disease  - Heart failure  - Heart attack  - Stroke  - DVT/VTE  - Cardiac arrhythmia  - Respiratory Failure/COPD  - Renal failure  - Anemia  - Advanced Liver disease     Chase Harris is an 62 y.o. male.    Chief Complaint: left knee pain  HPI: Pt is a 62 y.o. male complaining of left knee pain for multiple years. Pain had continually increased since the beginning. X-rays in the clinic show end-stage arthritic changes of the left knee. Pt has tried various conservative treatments which have failed to alleviate their symptoms, including injections and therapy. Various options are discussed with the patient. Risks, benefits and expectations were discussed with the patient. Patient understand the risks, benefits and expectations and wishes to proceed with surgery.   PCP:  Marisue Ivan, MD  D/C Plans: Home  PMH: Past Medical History:  Diagnosis Date   Arthritis    Constipation    Diabetes mellitus without complication (HCC)    Metformin   Diabetic peripheral neuropathy (HCC)    GERD (gastroesophageal reflux disease)    History of prostate cancer    Hypertension    Prostate cancer (HCC)    Sleep apnea    Stroke (HCC)     PSH: Past Surgical History:  Procedure Laterality Date   COLONOSCOPY WITH PROPOFOL N/A 06/27/2018   Procedure: COLONOSCOPY WITH PROPOFOL;  Surgeon: Scot Jun, MD;  Location: [facility] ENDOSCOPY;  Service: Endoscopy;  Laterality: N/A;   ESOPHAGOGASTRODUODENOSCOPY (EGD) WITH PROPOFOL N/A 06/27/2018   Procedure: ESOPHAGOGASTRODUODENOSCOPY (EGD) WITH PROPOFOL;  Surgeon: Scot Jun, MD;  Location: [facility] ENDOSCOPY;  Service: Endoscopy;  Laterality: N/A;   HERNIA REPAIR      PROSTATECTOMY     TAYLOR BUNIONECTOMY     WISDOM TOOTH EXTRACTION      Social History:  reports that he has quit smoking. He has never used smokeless tobacco. He reports current alcohol use of about 4.0 standard drinks of alcohol per week. He reports that he does not use drugs. BMI: Estimated body mass index is 38.4 kg/m as calculated from the following:   Height as of 11/23/20: 5\' 9"  (1.753 m).   Weight as of 01/23/23: 117.9 kg.  Lab Results  Component Value Date   ALBUMIN 3.2 (L) 04/08/2019   Diabetes:   Patient has a diagnosis of diabetes,  Lab Results  Component Value Date   HGBA1C 8.2 (H) 04/07/2019   Smoking Status:      Allergies:  Allergies  Allergen Reactions   Doxycycline Hives    Unknown - pt informed pharmacist 02/17/2017. Previously reported diarrhea, reported by pt. 01/11/21 reaction of Hives.    Penicillins Rash    At birth   Dynabac [Dirithromycin] Nausea And Vomiting    Other reaction(s): Abdominal Pain    Medications: No current facility-administered medications for this encounter.   Current Outpatient Medications  Medication Sig Dispense Refill   allopurinol (ZYLOPRIM) 100 MG tablet      aspirin EC 81 MG tablet Take 81 mg by mouth daily.      celecoxib (CELEBREX) 100 MG capsule Take 100 mg by mouth 2 (two) times daily.     colchicine  0.6 MG tablet Take 1 tablet (0.6 mg total) by mouth daily. 8 tablet 0   etanercept (ENBREL) 50 MG/ML injection Inject into the skin.     ezetimibe (ZETIA) 10 MG tablet      FARXIGA 10 MG TABS tablet Take 10 mg by mouth daily.     fluticasone (FLONASE) 50 MCG/ACT nasal spray Place 2 sprays into the nose daily as needed for allergies or rhinitis.      gabapentin (NEURONTIN) 300 MG capsule TAKE 1 CAPSULE BY MOUTH IN THE MORNING AND TAKE 1 CAPSULE AT LUNCH AND TAKE 2 CAPSULES AT NIGHT 120 capsule 3   HUMALOG KWIKPEN 100 UNIT/ML KwikPen Inject 50 Units into the skin with breakfast, with lunch, and with evening meal.      HUMIRA PEN 40 MG/0.4ML PNKT Inject 40 mg into the skin every 14 (fourteen) days. (Patient not taking: Reported on 11/22/2021)     hydrochlorothiazide (HYDRODIURIL) 25 MG tablet Take 25 mg by mouth daily.   0   hydroxychloroquine (PLAQUENIL) 200 MG tablet Take 1 tablet by mouth 2 (two) times daily.     insulin degludec (TRESIBA FLEXTOUCH) 100 UNIT/ML FlexTouch Pen Inject 25 Units into the skin daily.     lisinopril (PRINIVIL,ZESTRIL) 40 MG tablet Take 40 mg by mouth daily.   0   lisinopril (ZESTRIL) 40 MG tablet Take 1 tablet by mouth daily.     loratadine (CLARITIN) 10 MG tablet Take 10 mg by mouth daily.      metFORMIN (GLUCOPHAGE) 1000 MG tablet Take 1,000 mg by mouth 2 (two) times a day.      metFORMIN (GLUCOPHAGE) 1000 MG tablet Take 1 tablet by mouth 2 (two) times daily with a meal.     Microlet Lancets MISC      omeprazole (PRILOSEC) 20 MG capsule Take 20 mg by mouth daily.     omeprazole (PRILOSEC) 20 MG capsule Take 1 capsule by mouth daily.     predniSONE (DELTASONE) 2.5 MG tablet Take 2.5 mg by mouth daily.      Semaglutide,0.25 or 0.5MG /DOS, 2 MG/1.5ML SOPN Inject into the skin.     TOUJEO MAX SOLOSTAR 300 UNIT/ML SOPN Inject 130 Units into the skin at bedtime. (Patient not taking: Reported on 11/21/2022)      No results found for this or any previous visit (from the past 48 hour(s)). VAS [place] LOWER EXTREMITY ARTERIAL DUPLEX  Result Date: 01/24/2023 LOWER EXTREMITY ARTERIAL DUPLEX STUDY Patient Name:  Chase Harris  Date of Exam:   01/23/2023 Medical Rec #: [id number]            Accession #:    [id number] Date of Birth: [birth year]             Patient Gender: M Patient Age:   57 years Exam Location:  [facility] Procedure:      VAS [place] LOWER EXTREMITY ARTERIAL DUPLEX Referring Phys: Levora Dredge --------------------------------------------------------------------------------  Indications: Non compressible Arteries.  Current ABI: Not obtained Performing Technologist: Debbe Bales RVS  Examination Guidelines: A complete evaluation includes B-mode imaging, spectral Doppler, color Doppler, and power Doppler as needed of all accessible portions of each vessel. Bilateral testing is considered an integral part of a complete examination. Limited examinations for reoccurring indications may be performed as noted.   +-----------+--------+-----+--------+---------+--------+ LEFT       PSV cm/sRatioStenosisWaveform Comments +-----------+--------+-----+--------+---------+--------+ CFA Distal 79  triphasic         +-----------+--------+-----+--------+---------+--------+ DFA        72                   biphasic          +-----------+--------+-----+--------+---------+--------+ SFA Prox   96                   triphasic         +-----------+--------+-----+--------+---------+--------+ SFA Mid    99                   triphasic         +-----------+--------+-----+--------+---------+--------+ SFA Distal 72                   triphasic         +-----------+--------+-----+--------+---------+--------+ POP Distal 71                   triphasic         +-----------+--------+-----+--------+---------+--------+ ATA Distal 68                   triphasic         +-----------+--------+-----+--------+---------+--------+ PTA Distal 27                   triphasic         +-----------+--------+-----+--------+---------+--------+ PERO Distal64                   triphasic         +-----------+--------+-----+--------+---------+--------+  Summary: Left: Imaging and Waveforms obtained throughout in the Left Lower Extremity. Triphasic waveforms obtained throughout in the Left Lower Extremity.  See table(s) above for measurements and observations. Electronically signed by Levora Dredge MD on 01/24/2023 at 8:39:05 AM.    Final     ROS: Pain with rom of the left lower extremity  Physical Exam: Alert and oriented 62 y.o. male in no acute  distress Cranial nerves 2-12 intact Cervical spine: full rom with no tenderness, nv intact distally Chest: active breath sounds bilaterally, no wheeze rhonchi or rales Heart: regular rate and rhythm, no murmur Abd: non tender non distended with active bowel sounds Hip is stable with rom  Left knee painful rom with crepitus Nv intact distally No rashes or edema distally  Assessment/Plan Assessment: left knee end stage osteoarthritis  Plan:  Patient will undergo a left total knee by Dr. Ranell Patrick at [place] Risks benefits and expectations were discussed with the patient. Patient understand risks, benefits and expectations and wishes to proceed. Preoperative templating of the joint replacement has been completed, documented, and submitted to the Operating Room personnel in order to optimize intra-operative equipment management.   Alphonsa Overall PA-C, MPAS [facility] is now [employer] [address], [address], [place], [place] [address] Phone: [phone] [email or web address] Facebook  [employer]

## 2023-01-27 NOTE — Patient Instructions (Signed)
SURGICAL WAITING ROOM VISITATION  Patients having surgery or a procedure may have no more than 2 support people in the waiting area - these visitors may rotate.    Children under the age of 26 must have an adult with them who is not the patient.  Due to an increase in RSV and influenza rates and associated hospitalizations, children ages 26 and under may not visit patients in [facility] hospitals.  If the patient needs to stay at the hospital during part of their recovery, the visitor guidelines for inpatient rooms apply. Pre-op nurse will coordinate an appropriate time for 1 support person to accompany patient in pre-op.  This support person may not rotate.    Please refer to the [facility] website for the visitor guidelines for Inpatients (after your surgery is over and you are in a regular room).       Your procedure is scheduled on: 02/10/2023    Report to [facility] Main Entrance    Report to admitting at  0515 AM   Call this number if you have problems the morning of surgery [phone]   Do not eat food :After Midnight.   After Midnight you may have the following liquids until __ 0430____ AM DAY OF SURGERY  Water Non-Citrus Juices (without pulp, NO RED-Apple, White grape, White cranberry) Black Coffee (NO MILK/CREAM OR CREAMERS, sugar ok)  Clear Tea (NO MILK/CREAM OR CREAMERS, sugar ok) regular and decaf                             Plain Jell-O (NO RED)                                           Fruit ices (not with fruit pulp, NO RED)                                     Popsicles (NO RED)                                                               Sports drinks like Gatorade (NO RED)                    The day of surgery:  Drink ONE (1) Pre-Surgery Clear Ensure or G2 at   0430AM ( have compelted by ) the morning of surgery. Drink in one sitting. Do not sip.  This drink was given to you during your hospital  pre-op appointment visit. Nothing else to  drink after completing the  Pre-Surgery Clear Ensure or G2.          If you have questions, please contact your surgeon's office.     Oral Hygiene is also important to reduce your risk of infection.                                    Remember - BRUSH YOUR TEETH THE MORNING OF SURGERY WITH YOUR REGULAR TOOTHPASTE  DENTURES WILL BE REMOVED PRIOR TO SURGERY PLEASE DO NOT APPLY "Poly grip" OR ADHESIVES!!!   Do NOT smoke after Midnight   Take these medicines the morning of surgery with A SIP OF WATER:  gabapentin, omeprazole , allopurinol, claritin          Ozempic-         Tresiba-        Humalog- with meals-  Farxiga-   DO NOT TAKE ANY ORAL DIABETIC MEDICATIONS DAY OF YOUR SURGERY  Bring CPAP mask and tubing day of surgery.                              You may not have any metal on your body including hair pins, jewelry, and body piercing             Do not wear make-up, lotions, powders, perfumes/cologne, or deodorant  Do not wear nail polish including gel and S&S, artificial/acrylic nails, or any other type of covering on natural nails including finger and toenails. If you have artificial nails, gel coating, etc. that needs to be removed by a nail salon please have this removed prior to surgery or surgery may need to be canceled/ delayed if the surgeon/ anesthesia feels like they are unable to be safely monitored.   Do not shave  48 hours prior to surgery.               Men may shave face and neck.   Do not bring valuables to the hospital. [facility] IS NOT             RESPONSIBLE   FOR VALUABLES.   Contacts, glasses, dentures or bridgework may not be worn into surgery.   Bring small overnight bag day of surgery.   DO NOT BRING YOUR HOME MEDICATIONS TO THE HOSPITAL. PHARMACY WILL DISPENSE MEDICATIONS LISTED ON YOUR MEDICATION LIST TO YOU DURING YOUR ADMISSION IN THE HOSPITAL!    Patients discharged on the day of surgery will not be allowed to drive home.  Someone NEEDS to  stay with you for the first 24 hours after anesthesia.   Special Instructions: Bring a copy of your healthcare power of attorney and living will documents the day of surgery if you haven't scanned them before.              Please read over the following fact sheets you were given: IF YOU HAVE QUESTIONS ABOUT YOUR PRE-OP INSTRUCTIONS PLEASE CALL [phone]   If you received a COVID test during your pre-op visit  it is requested that you wear a mask when out in public, stay away from anyone that may not be feeling well and notify your surgeon if you develop symptoms. If you test positive for Covid or have been in contact with anyone that has tested positive in the last 10 days please notify you surgeon.

## 2023-01-27 NOTE — Progress Notes (Signed)
Anesthesia Review:  PCP: [place]ong- LOV 06/28/22  Cardiologist : Vasc- DR Schnier LOV 01/23/23  Chest x-ray : EKG : Echo : Stress test: Cardiac Cath :  Activity level:  Sleep Study/ CPAP : Fasting Blood Sugar :      / Checks Blood Sugar -- times a day:   Blood Thinner/ Instructions /Last Dose: ASA / Instructions/ Last Dose :    81 mg aspirin   DM-  Hgba1c-  Ozempic Metformin Tresiba-  Humalog with meals Farxiga    01/24/23- CMP-

## 2023-01-29 ENCOUNTER — Encounter (INDEPENDENT_AMBULATORY_CARE_PROVIDER_SITE_OTHER): Payer: Self-pay | Admitting: Vascular Surgery

## 2023-01-31 ENCOUNTER — Other Ambulatory Visit: Payer: Self-pay

## 2023-01-31 ENCOUNTER — Ambulatory Visit: Payer: Medicare Other | Admitting: Podiatry

## 2023-01-31 ENCOUNTER — Encounter (HOSPITAL_COMMUNITY)
Admission: RE | Admit: 2023-01-31 | Discharge: 2023-01-31 | Disposition: A | Discharge status: Home / Self Care | Payer: Medicare Other | Source: Ambulatory Visit | Attending: Orthopedic Surgery | Admitting: Orthopedic Surgery

## 2023-01-31 ENCOUNTER — Encounter (HOSPITAL_COMMUNITY): Payer: Self-pay

## 2023-01-31 VITALS — BP 137/70 | HR 94 | Temp 98.4°F | Resp 16 | Ht 69.0 in | Wt 254.0 lb

## 2023-01-31 DIAGNOSIS — I129 Hypertensive chronic kidney disease with stage 1 through stage 4 chronic kidney disease, or unspecified chronic kidney disease: Secondary | ICD-10-CM | POA: Insufficient documentation

## 2023-01-31 DIAGNOSIS — G4733 Obstructive sleep apnea (adult) (pediatric): Secondary | ICD-10-CM | POA: Diagnosis not present

## 2023-01-31 DIAGNOSIS — Z01818 Encounter for other preprocedural examination: Secondary | ICD-10-CM | POA: Diagnosis present

## 2023-01-31 DIAGNOSIS — Z794 Long term (current) use of insulin: Secondary | ICD-10-CM | POA: Insufficient documentation

## 2023-01-31 DIAGNOSIS — I4510 Unspecified right bundle-branch block: Secondary | ICD-10-CM | POA: Diagnosis not present

## 2023-01-31 DIAGNOSIS — Z79620 Long term (current) use of immunosuppressive biologic: Secondary | ICD-10-CM | POA: Insufficient documentation

## 2023-01-31 DIAGNOSIS — Z7985 Long-term (current) use of injectable non-insulin antidiabetic drugs: Secondary | ICD-10-CM | POA: Diagnosis not present

## 2023-01-31 DIAGNOSIS — K7581 Nonalcoholic steatohepatitis (NASH): Secondary | ICD-10-CM | POA: Diagnosis not present

## 2023-01-31 DIAGNOSIS — Z79899 Other long term (current) drug therapy: Secondary | ICD-10-CM | POA: Insufficient documentation

## 2023-01-31 DIAGNOSIS — M069 Rheumatoid arthritis, unspecified: Secondary | ICD-10-CM | POA: Insufficient documentation

## 2023-01-31 DIAGNOSIS — M109 Gout, unspecified: Secondary | ICD-10-CM | POA: Insufficient documentation

## 2023-01-31 DIAGNOSIS — E1122 Type 2 diabetes mellitus with diabetic chronic kidney disease: Secondary | ICD-10-CM | POA: Insufficient documentation

## 2023-01-31 DIAGNOSIS — E1151 Type 2 diabetes mellitus with diabetic peripheral angiopathy without gangrene: Secondary | ICD-10-CM | POA: Diagnosis not present

## 2023-01-31 DIAGNOSIS — N189 Chronic kidney disease, unspecified: Secondary | ICD-10-CM | POA: Insufficient documentation

## 2023-01-31 DIAGNOSIS — M1712 Unilateral primary osteoarthritis, left knee: Secondary | ICD-10-CM | POA: Insufficient documentation

## 2023-01-31 DIAGNOSIS — Z7984 Long term (current) use of oral hypoglycemic drugs: Secondary | ICD-10-CM | POA: Insufficient documentation

## 2023-01-31 DIAGNOSIS — Z8546 Personal history of malignant neoplasm of prostate: Secondary | ICD-10-CM | POA: Insufficient documentation

## 2023-01-31 DIAGNOSIS — K219 Gastro-esophageal reflux disease without esophagitis: Secondary | ICD-10-CM | POA: Diagnosis not present

## 2023-01-31 HISTORY — DX: Chronic kidney disease, unspecified: N18.9

## 2023-01-31 HISTORY — DX: Peripheral vascular disease, unspecified: I73.9

## 2023-01-31 LAB — CBC
HCT: 48 % (ref 39.0–52.0)
Hemoglobin: 15.5 g/dL (ref 13.0–17.0)
MCH: 28.5 pg (ref 26.0–34.0)
MCHC: 32.3 g/dL (ref 30.0–36.0)
MCV: 88.4 fL (ref 80.0–100.0)
Platelets: 270 10*3/uL (ref 150–400)
RBC: 5.43 MIL/uL (ref 4.22–5.81)
RDW: 14.4 % (ref 11.5–15.5)
WBC: 10.5 10*3/uL (ref 4.0–10.5)
nRBC: 0 % (ref 0.0–0.2)

## 2023-01-31 LAB — BASIC METABOLIC PANEL
Anion gap: 11 (ref 5–15)
BUN: 40 mg/dL — ABNORMAL HIGH (ref 8–23)
CO2: 22 mmol/L (ref 22–32)
Calcium: 9.3 mg/dL (ref 8.9–10.3)
Chloride: 101 mmol/L (ref 98–111)
Creatinine, Ser: 1.46 mg/dL — ABNORMAL HIGH (ref 0.61–1.24)
GFR, Estimated: 54 mL/min — ABNORMAL LOW (ref >60)
Glucose, Bld: 136 mg/dL — ABNORMAL HIGH (ref 70–99)
Potassium: 4.1 mmol/L (ref 3.5–5.1)
Sodium: 134 mmol/L — ABNORMAL LOW (ref 135–145)

## 2023-01-31 LAB — SURGICAL PCR SCREEN
MRSA, PCR: NEGATIVE
Staphylococcus aureus: POSITIVE — AB

## 2023-01-31 LAB — GLUCOSE, CAPILLARY: Glucose-Capillary: 134 mg/dL — ABNORMAL HIGH (ref 70–99)

## 2023-01-31 LAB — HEMOGLOBIN A1C
Hgb A1c MFr Bld: 7.3 % — ABNORMAL HIGH (ref 4.8–5.6)
Mean Plasma Glucose: 162.81 mg/dL

## 2023-02-02 NOTE — Progress Notes (Signed)
Case: [id number] Date/Time: 02/10/23 0715   Procedure: TOTAL KNEE ARTHROPLASTY (Left: Knee) - choice with interscalene block   Anesthesia type: Choice   Pre-op diagnosis: left knee osteoarthritis   Location: [facility] ROOM 06 / [facility]   Surgeons: Beverely Low, MD       DISCUSSION: Chase Harris is a 62 yo male who presents to PAT clinic prior to L TKA. PMH significant for former smoker (quit 1989), insulin dependent T2DM (followed by endocrine), RA (followed by rheumatology), gout, PAD (followed by Vascular), OSA on CPAP, HTN, hx of prostate cancer (in remission), CKD, NASH, GERD  Patient saw his PCP on 01/31/23 for pre-op clearance:   "Stable. Medically cleared from my standpoint to proceed with left total knee replacement per Dr. Devonne Doughty on 02/10/2023. Low to medium risk candidate. Awaiting preop labs today."   Patient has had difficulty with control of his blood sugars however most recent HgA1c on 01/31/23 is 7.3 in acceptable range. Followed by Endocrine regularly (Last seen 11/17/22).  Patient saw Vascular surgery on 01/23/23 who recommended continued medication management with periodic surveillance.   Follows GI for NASH and GERD which is controlled on PPI.  Follows Rheumatology for RA and gout - last seen 08/23/22. Cleared with recommendations to hold Enbrel 7 days prior to surgery.   VS: BP 137/70   Pulse 94   Temp 36.9 C (Oral)   Resp 16   Ht 5\' 9"  (1.753 m)   Wt 115.2 kg   SpO2 97%   BMI 37.51 kg/m   PROVIDERS: PCP: Marisue Ivan, MD Endocrinology- Elisha Ponder, PA-C Rheumatology- Mayur Allena Katz, MD Vasc- Levora Dredge, MD    LABS: Labs reviewed: Acceptable for surgery. (all labs ordered are listed, but only abnormal results are displayed)  Labs Reviewed  SURGICAL PCR SCREEN - Abnormal; Notable for the following components:      Result Value   Staphylococcus aureus POSITIVE (*)    All other components within normal limits  HEMOGLOBIN A1C -  Abnormal; Notable for the following components:   Hgb A1c MFr Bld 7.3 (*)    All other components within normal limits  BASIC METABOLIC PANEL - Abnormal; Notable for the following components:   Sodium 134 (*)    Glucose, Bld 136 (*)    BUN 40 (*)    Creatinine, Ser 1.46 (*)    GFR, Estimated 54 (*)    All other components within normal limits  GLUCOSE, CAPILLARY - Abnormal; Notable for the following components:   Glucose-Capillary 134 (*)    All other components within normal limits  CBC     IMAGES: n/a   EKG 01/31/23:  NSR RBBB No significant change since last tracing   CV:   Echo 06/14/17 (care everywhere): INTERPRETATION  NORMAL LEFT VENTRICULAR SYSTOLIC FUNCTION  NORMAL RIGHT VENTRICULAR SYSTOLIC FUNCTION  MILD VALVULAR REGURGITATION (See below)  NO VALVULAR STENOSIS  Normal pulmonary pressures   DOPPLER ECHO and OTHER SPECIAL PROCEDURES     Aortic:TRIVIAL AR                    No AS      Mitral:TRIVIAL MR                    No MS            MV Inflow E Vel=81.5 cm/sec   MV Annulus E'Vel=nm*            E/E'Ratio=nm*   Tricuspid:MILD TR  No TS            179.4 cm/sec peak TR vel      15.9 mmHg peak RV pressure   Pulmonary:TRIVIAL PR                    No PS   Past Medical History:  Diagnosis Date   Arthritis    Chronic kidney disease    Constipation    Diabetes mellitus without complication (HCC)    Metformin   Diabetic peripheral neuropathy (HCC)    GERD (gastroesophageal reflux disease)    History of prostate cancer    Hypertension    Peripheral vascular disease (HCC)    Prostate cancer (HCC)    Sleep apnea    has cpap   Stroke [facility])    mini stroke 2016 - no deficitis    Past Surgical History:  Procedure Laterality Date   COLONOSCOPY WITH PROPOFOL N/A 06/27/2018   Procedure: COLONOSCOPY WITH PROPOFOL;  Surgeon: Scot Jun, MD;  Location: [facility] ENDOSCOPY;  Service: Endoscopy;  Laterality: N/A;    ESOPHAGOGASTRODUODENOSCOPY (EGD) WITH PROPOFOL N/A 06/27/2018   Procedure: ESOPHAGOGASTRODUODENOSCOPY (EGD) WITH PROPOFOL;  Surgeon: Scot Jun, MD;  Location: [facility] ENDOSCOPY;  Service: Endoscopy;  Laterality: N/A;   HERNIA REPAIR     PROSTATECTOMY     TAYLOR BUNIONECTOMY     WISDOM TOOTH EXTRACTION      MEDICATIONS:  allopurinol (ZYLOPRIM) 300 MG tablet   aspirin EC 81 MG tablet   celecoxib (CELEBREX) 100 MG capsule   colchicine 0.6 MG tablet   etanercept (ENBREL) 50 MG/ML injection   ezetimibe (ZETIA) 10 MG tablet   FARXIGA 10 MG TABS tablet   fenofibrate 160 MG tablet   fluticasone (FLONASE) 50 MCG/ACT nasal spray   gabapentin (NEURONTIN) 300 MG capsule   HUMALOG KWIKPEN 100 UNIT/ML KwikPen   hydrochlorothiazide (HYDRODIURIL) 25 MG tablet   hydroxychloroquine (PLAQUENIL) 200 MG tablet   insulin degludec (TRESIBA FLEXTOUCH) 100 UNIT/ML FlexTouch Pen   lisinopril (PRINIVIL,ZESTRIL) 40 MG tablet   loratadine (CLARITIN) 10 MG tablet   metFORMIN (GLUCOPHAGE) 1000 MG tablet   Microlet Lancets MISC   Multiple Vitamin (MULTIVITAMIN WITH MINERALS) TABS tablet   omeprazole (PRILOSEC) 20 MG capsule   predniSONE (DELTASONE) 5 MG tablet   Semaglutide,0.25 or 0.5MG /DOS, 2 MG/1.5ML SOPN   No current facility-administered medications for this encounter.    Marcille Blanco [facility] Short Stay/Anesthesiology [facility] Phone [phone] 02/02/2023 1:56 PM

## 2023-02-02 NOTE — Anesthesia Preprocedure Evaluation (Addendum)
Anesthesia Evaluation  Patient identified by MRN, date of birth, ID band Patient awake    Reviewed: Allergy & Precautions, NPO status , Patient's Chart, lab work & pertinent test results  Airway Mallampati: III  TM Distance: >3 FB Neck ROM: Full    Dental no notable dental hx.    Pulmonary sleep apnea , former smoker   Pulmonary exam normal        Cardiovascular hypertension, Pt. on medications + Peripheral Vascular Disease   Rhythm:Regular Rate:Normal     Neuro/Psych  Headaches CVA  negative psych ROS   GI/Hepatic Neg liver ROS,GERD  Medicated,,  Endo/Other  diabetes, Type 2, Insulin Dependent, Oral Hypoglycemic Agents    Renal/GU Renal disease  negative genitourinary   Musculoskeletal  (+) Arthritis , Osteoarthritis and Rheumatoid disorders,    Abdominal Normal abdominal exam  (+)   Peds  Hematology Lab Results      Component                Value               Date                      WBC                      10.5                01/31/2023                HGB                      15.5                01/31/2023                HCT                      48.0                01/31/2023                MCV                      88.4                01/31/2023                PLT                      270                 01/31/2023             Lab Results      Component                Value               Date                      NA                       134 (L)             01/31/2023                K  4.1                 01/31/2023                CO2                      22                  01/31/2023                GLUCOSE                  136 (H)             01/31/2023                BUN                      40 (H)              01/31/2023                CREATININE               1.46 (H)            01/31/2023                CALCIUM                  9.3                 01/31/2023                 GFRNONAA                 54 (L)              01/31/2023              Anesthesia Other Findings   Reproductive/Obstetrics                             Anesthesia Physical Anesthesia Plan  ASA: 3  Anesthesia Plan: MAC, Regional and Spinal   Post-op Pain Management: Regional block*   Induction: Intravenous  PONV Risk Score and Plan: 1 and Ondansetron, Dexamethasone, Propofol infusion, Midazolam and Treatment may vary due to age or medical condition  Airway Management Planned: Simple Face Mask and Nasal Cannula  Additional Equipment: None  Intra-op Plan:   Post-operative Plan:   Informed Consent: I have reviewed the patients History and Physical, chart, labs and discussed the procedure including the risks, benefits and alternatives for the proposed anesthesia with the patient or authorized representative who has indicated his/her understanding and acceptance.     Dental advisory given  Plan Discussed with: CRNA  Anesthesia Plan Comments: (See PAT note from 5/7 by Sherlie Ban PA-C)        Anesthesia Quick Evaluation

## 2023-02-10 ENCOUNTER — Other Ambulatory Visit: Payer: Self-pay

## 2023-02-10 ENCOUNTER — Encounter (HOSPITAL_COMMUNITY): Payer: Self-pay | Admitting: Orthopedic Surgery

## 2023-02-10 ENCOUNTER — Ambulatory Visit (HOSPITAL_BASED_OUTPATIENT_CLINIC_OR_DEPARTMENT_OTHER): Payer: Medicare Other | Admitting: Certified Registered"

## 2023-02-10 ENCOUNTER — Encounter (HOSPITAL_COMMUNITY)
Admission: RE | Disposition: A | Discharge status: Home / Self Care | Payer: Self-pay | Source: Ambulatory Visit | Attending: Orthopedic Surgery

## 2023-02-10 ENCOUNTER — Ambulatory Visit (HOSPITAL_COMMUNITY)
Admission: RE | Admit: 2023-02-10 | Discharge: 2023-02-10 | Disposition: A | Discharge status: Home / Self Care | Payer: Medicare Other | Source: Ambulatory Visit | Attending: Orthopedic Surgery | Admitting: Orthopedic Surgery

## 2023-02-10 ENCOUNTER — Ambulatory Visit (HOSPITAL_COMMUNITY): Payer: Medicare Other | Admitting: Medical

## 2023-02-10 DIAGNOSIS — G4730 Sleep apnea, unspecified: Secondary | ICD-10-CM | POA: Diagnosis not present

## 2023-02-10 DIAGNOSIS — Z87891 Personal history of nicotine dependence: Secondary | ICD-10-CM

## 2023-02-10 DIAGNOSIS — E1151 Type 2 diabetes mellitus with diabetic peripheral angiopathy without gangrene: Secondary | ICD-10-CM | POA: Diagnosis not present

## 2023-02-10 DIAGNOSIS — E1142 Type 2 diabetes mellitus with diabetic polyneuropathy: Secondary | ICD-10-CM | POA: Diagnosis not present

## 2023-02-10 DIAGNOSIS — Z794 Long term (current) use of insulin: Secondary | ICD-10-CM | POA: Insufficient documentation

## 2023-02-10 DIAGNOSIS — Z7952 Long term (current) use of systemic steroids: Secondary | ICD-10-CM | POA: Insufficient documentation

## 2023-02-10 DIAGNOSIS — I10 Essential (primary) hypertension: Secondary | ICD-10-CM | POA: Diagnosis not present

## 2023-02-10 DIAGNOSIS — Z7984 Long term (current) use of oral hypoglycemic drugs: Secondary | ICD-10-CM | POA: Diagnosis not present

## 2023-02-10 DIAGNOSIS — M1712 Unilateral primary osteoarthritis, left knee: Secondary | ICD-10-CM

## 2023-02-10 DIAGNOSIS — K219 Gastro-esophageal reflux disease without esophagitis: Secondary | ICD-10-CM | POA: Diagnosis not present

## 2023-02-10 DIAGNOSIS — Z01818 Encounter for other preprocedural examination: Secondary | ICD-10-CM

## 2023-02-10 DIAGNOSIS — Z8546 Personal history of malignant neoplasm of prostate: Secondary | ICD-10-CM | POA: Diagnosis not present

## 2023-02-10 DIAGNOSIS — Z79899 Other long term (current) drug therapy: Secondary | ICD-10-CM | POA: Diagnosis not present

## 2023-02-10 HISTORY — PX: TOTAL KNEE ARTHROPLASTY: SHX125

## 2023-02-10 LAB — GLUCOSE, CAPILLARY
Glucose-Capillary: 163 mg/dL — ABNORMAL HIGH (ref 70–99)
Glucose-Capillary: 162 mg/dL — ABNORMAL HIGH (ref 70–99)

## 2023-02-10 SURGERY — ARTHROPLASTY, KNEE, TOTAL
Anesthesia: Monitor Anesthesia Care | Site: Knee | Laterality: Left

## 2023-02-10 MED ORDER — OXYCODONE HCL 5 MG PO TABS
5.0000 mg | ORAL_TABLET | ORAL | Status: DC | PRN
  Administered 2023-02-10: 5 mg via ORAL

## 2023-02-10 MED ORDER — FENTANYL CITRATE (PF) 100 MCG/2ML IJ SOLN
INTRAMUSCULAR | Status: DC | PRN
  Administered 2023-02-10: 50 ug via INTRAVENOUS

## 2023-02-10 MED ORDER — DEXAMETHASONE SODIUM PHOSPHATE 10 MG/ML IJ SOLN
INTRAMUSCULAR | Status: DC | PRN
  Administered 2023-02-10: 8 mg via INTRAVENOUS

## 2023-02-10 MED ORDER — PROPOFOL 1000 MG/100ML IV EMUL
INTRAVENOUS | Status: AC
  Filled 2023-02-10: qty 100

## 2023-02-10 MED ORDER — METHOCARBAMOL 500 MG PO TABS: 500.0000 mg | ORAL_TABLET | Freq: Four times a day (QID) | ORAL | Status: DC | PRN

## 2023-02-10 MED ORDER — BUPIVACAINE IN DEXTROSE 0.75-8.25 % IT SOLN
INTRATHECAL | Status: DC | PRN
  Administered 2023-02-10: 1.8 mL via INTRATHECAL

## 2023-02-10 MED ORDER — POVIDONE-IODINE 10 % EX SWAB: 2.0000 | Freq: Once | CUTANEOUS | Status: DC

## 2023-02-10 MED ORDER — ONDANSETRON HCL 4 MG/2ML IJ SOLN
INTRAMUSCULAR | Status: DC | PRN
  Administered 2023-02-10: 4 mg via INTRAVENOUS

## 2023-02-10 MED ORDER — MIDAZOLAM HCL 2 MG/2ML IJ SOLN
INTRAMUSCULAR | Status: DC | PRN
  Administered 2023-02-10: 2 mg via INTRAVENOUS

## 2023-02-10 MED ORDER — 0.9 % SODIUM CHLORIDE (POUR BTL) OPTIME
TOPICAL | Status: DC | PRN
  Administered 2023-02-10: 1000 mL

## 2023-02-10 MED ORDER — PROPOFOL 10 MG/ML IV BOLUS
INTRAVENOUS | Status: DC | PRN
  Administered 2023-02-10: 10 mg via INTRAVENOUS
  Administered 2023-02-10: 40 mg via INTRAVENOUS

## 2023-02-10 MED ORDER — OXYCODONE HCL 5 MG PO TABS
ORAL_TABLET | ORAL | Status: AC
  Filled 2023-02-10: qty 1

## 2023-02-10 MED ORDER — ACETAMINOPHEN 10 MG/ML IV SOLN: 1000.0000 mg | Freq: Once | INTRAVENOUS | Status: DC | PRN

## 2023-02-10 MED ORDER — PROPOFOL 10 MG/ML IV BOLUS
INTRAVENOUS | Status: AC
  Filled 2023-02-10: qty 20

## 2023-02-10 MED ORDER — ONDANSETRON HCL 4 MG/2ML IJ SOLN
INTRAMUSCULAR | Status: AC
  Filled 2023-02-10: qty 2

## 2023-02-10 MED ORDER — OXYCODONE-ACETAMINOPHEN 5-325 MG PO TABS: 1.0000 | ORAL_TABLET | ORAL | 0 refills | Status: AC | PRN

## 2023-02-10 MED ORDER — CHLORHEXIDINE GLUCONATE 0.12 % MT SOLN
15.0000 mL | Freq: Once | OROMUCOSAL | Status: AC
  Administered 2023-02-10: 15 mL via OROMUCOSAL

## 2023-02-10 MED ORDER — BUPIVACAINE LIPOSOME 1.3 % IJ SUSP
INTRAMUSCULAR | Status: AC
  Filled 2023-02-10: qty 20

## 2023-02-10 MED ORDER — SODIUM CHLORIDE (PF) 0.9 % IJ SOLN
INTRAMUSCULAR | Status: DC | PRN
  Administered 2023-02-10: 30 mL

## 2023-02-10 MED ORDER — PROPOFOL 500 MG/50ML IV EMUL
INTRAVENOUS | Status: DC | PRN
  Administered 2023-02-10: 75 ug/kg/min via INTRAVENOUS

## 2023-02-10 MED ORDER — METHOCARBAMOL 500 MG IVPB - SIMPLE MED: 500.0000 mg | Freq: Four times a day (QID) | INTRAVENOUS | Status: DC | PRN

## 2023-02-10 MED ORDER — BUPIVACAINE HCL (PF) 0.25 % IJ SOLN
INTRAMUSCULAR | Status: AC
  Filled 2023-02-10: qty 30

## 2023-02-10 MED ORDER — ONDANSETRON HCL 4 MG PO TABS: 4.0000 mg | ORAL_TABLET | Freq: Three times a day (TID) | ORAL | 1 refills | Status: AC | PRN

## 2023-02-10 MED ORDER — DEXAMETHASONE SODIUM PHOSPHATE 10 MG/ML IJ SOLN
INTRAMUSCULAR | Status: AC
  Filled 2023-02-10: qty 1

## 2023-02-10 MED ORDER — BUPIVACAINE LIPOSOME 1.3 % IJ SUSP: 20.0000 mL | Freq: Once | INTRAMUSCULAR | Status: DC

## 2023-02-10 MED ORDER — LACTATED RINGERS IV SOLN: INTRAVENOUS | Status: DC

## 2023-02-10 MED ORDER — CEFAZOLIN SODIUM-DEXTROSE 2-4 GM/100ML-% IV SOLN: 2.0000 g | Freq: Four times a day (QID) | INTRAVENOUS | Status: DC

## 2023-02-10 MED ORDER — MIDAZOLAM HCL 2 MG/2ML IJ SOLN
INTRAMUSCULAR | Status: AC
  Filled 2023-02-10: qty 2

## 2023-02-10 MED ORDER — TRANEXAMIC ACID-NACL 1000-0.7 MG/100ML-% IV SOLN
INTRAVENOUS | Status: AC
  Filled 2023-02-10: qty 100

## 2023-02-10 MED ORDER — METHOCARBAMOL 500 MG PO TABS: 500.0000 mg | ORAL_TABLET | Freq: Three times a day (TID) | ORAL | 1 refills | Status: DC | PRN

## 2023-02-10 MED ORDER — SODIUM CHLORIDE 0.9 % IR SOLN
Status: DC | PRN
  Administered 2023-02-10: 1000 mL

## 2023-02-10 MED ORDER — CEFAZOLIN SODIUM-DEXTROSE 2-4 GM/100ML-% IV SOLN
2.0000 g | INTRAVENOUS | Status: AC
  Administered 2023-02-10: 2 g via INTRAVENOUS
  Filled 2023-02-10: qty 100

## 2023-02-10 MED ORDER — ASPIRIN EC 81 MG PO TBEC: 81.0000 mg | DELAYED_RELEASE_TABLET | Freq: Two times a day (BID) | ORAL | 0 refills | Status: AC

## 2023-02-10 MED ORDER — BUPIVACAINE LIPOSOME 1.3 % IJ SUSP
INTRAMUSCULAR | Status: DC | PRN
  Administered 2023-02-10: 20 mL

## 2023-02-10 MED ORDER — CEFAZOLIN SODIUM-DEXTROSE 2-4 GM/100ML-% IV SOLN
INTRAVENOUS | Status: AC
  Administered 2023-02-10: 2 g via INTRAVENOUS
  Filled 2023-02-10: qty 100

## 2023-02-10 MED ORDER — EPINEPHRINE PF 1 MG/ML IJ SOLN
INTRAMUSCULAR | Status: AC
  Filled 2023-02-10: qty 1

## 2023-02-10 MED ORDER — TRANEXAMIC ACID-NACL 1000-0.7 MG/100ML-% IV SOLN
1000.0000 mg | Freq: Once | INTRAVENOUS | Status: AC
  Administered 2023-02-10: 1000 mg via INTRAVENOUS

## 2023-02-10 MED ORDER — FENTANYL CITRATE (PF) 100 MCG/2ML IJ SOLN
INTRAMUSCULAR | Status: AC
  Filled 2023-02-10: qty 2

## 2023-02-10 MED ORDER — SODIUM CHLORIDE (PF) 0.9 % IJ SOLN
INTRAMUSCULAR | Status: AC
  Filled 2023-02-10: qty 30

## 2023-02-10 MED ORDER — TRANEXAMIC ACID-NACL 1000-0.7 MG/100ML-% IV SOLN
1000.0000 mg | INTRAVENOUS | Status: AC
  Administered 2023-02-10: 1000 mg via INTRAVENOUS
  Filled 2023-02-10: qty 100

## 2023-02-10 MED ORDER — FENTANYL CITRATE PF 50 MCG/ML IJ SOSY: 25.0000 ug | PREFILLED_SYRINGE | INTRAMUSCULAR | Status: DC | PRN

## 2023-02-10 MED ORDER — ROPIVACAINE HCL 7.5 MG/ML IJ SOLN
INTRAMUSCULAR | Status: DC | PRN
  Administered 2023-02-10: 20 mL via PERINEURAL

## 2023-02-10 MED ORDER — BUPIVACAINE-EPINEPHRINE 0.25% -1:200000 IJ SOLN
INTRAMUSCULAR | Status: DC | PRN
  Administered 2023-02-10: 30 mL

## 2023-02-10 MED ORDER — ORAL CARE MOUTH RINSE: 15.0000 mL | Freq: Once | OROMUCOSAL | Status: AC

## 2023-02-10 SURGICAL SUPPLY — 60 items
ATTUNE MED DOME PAT 38 KNEE (Knees) IMPLANT
ATTUNE PS FEM LT SZ 6 CEM KNEE (Femur) IMPLANT
ATTUNE PSRP INSR SZ6 6 KNEE (Insert) IMPLANT
BAG COUNTER SPONGE SURGICOUNT (BAG) IMPLANT
BAG SPEC THK2 15X12 ZIP CLS (MISCELLANEOUS) ×1
BAG SPNG CNTER NS LX DISP (BAG) ×1
BAG ZIPLOCK 12X15 (MISCELLANEOUS) IMPLANT
BASE TIBIAL ROT PLAT SZ 7 KNEE (Knees) IMPLANT
BLADE SAG 18X100X1.27 (BLADE) ×2 IMPLANT
BLADE SAW SGTL 13X75X1.27 (BLADE) ×2 IMPLANT
BNDG CMPR MED 10X6 ELC LF (GAUZE/BANDAGES/DRESSINGS) ×1
BNDG ELASTIC 6X10 VLCR STRL LF (GAUZE/BANDAGES/DRESSINGS) ×2 IMPLANT
BNDG GAUZE DERMACEA FLUFF 4 (GAUZE/BANDAGES/DRESSINGS) ×2 IMPLANT
BNDG GZE DERMACEA 4 6PLY (GAUZE/BANDAGES/DRESSINGS) ×1
BOWL SMART MIX CTS (DISPOSABLE) ×2 IMPLANT
BSPLAT TIB 7 CMNT ROT PLAT STR (Knees) ×1 IMPLANT
CEMENT HV SMART SET (Cement) ×4 IMPLANT
COVER SURGICAL LIGHT HANDLE (MISCELLANEOUS) ×2 IMPLANT
CUFF TOURN SGL QUICK 34 (TOURNIQUET CUFF) ×1
CUFF TRNQT CYL 34X4.125X (TOURNIQUET CUFF) ×2 IMPLANT
DRAPE INCISE IOBAN 66X45 STRL (DRAPES) ×2 IMPLANT
DRAPE SHEET LG 3/4 BI-LAMINATE (DRAPES) ×2 IMPLANT
DRAPE U-SHAPE 47X51 STRL (DRAPES) ×2 IMPLANT
DRSG ADAPTIC 3X8 NADH LF (GAUZE/BANDAGES/DRESSINGS) ×2 IMPLANT
DURAPREP 26ML APPLICATOR (WOUND CARE) ×2 IMPLANT
ELECT REM PT RETURN 15FT ADLT (MISCELLANEOUS) ×2 IMPLANT
FILTER STRAW (MISCELLANEOUS) IMPLANT
GAUZE PAD ABD 8X10 STRL (GAUZE/BANDAGES/DRESSINGS) ×2 IMPLANT
GAUZE SPONGE 4X4 12PLY STRL (GAUZE/BANDAGES/DRESSINGS) ×2 IMPLANT
GLOVE BIOGEL PI IND STRL 7.5 (GLOVE) ×2 IMPLANT
GLOVE BIOGEL PI IND STRL 8.5 (GLOVE) ×2 IMPLANT
GLOVE ORTHO TXT STRL SZ7.5 (GLOVE) ×2 IMPLANT
GLOVE SURG ORTHO 8.5 STRL (GLOVE) ×4 IMPLANT
GOWN STRL REUS W/ TWL XL LVL3 (GOWN DISPOSABLE) ×4 IMPLANT
GOWN STRL REUS W/TWL XL LVL3 (GOWN DISPOSABLE) ×2
HANDPIECE INTERPULSE COAX TIP (DISPOSABLE) ×1
HOLDER FOLEY CATH W/STRAP (MISCELLANEOUS) IMPLANT
IMMOBILIZER KNEE 20 (SOFTGOODS) ×1
IMMOBILIZER KNEE 20 THIGH 36 (SOFTGOODS) IMPLANT
KIT TURNOVER KIT A (KITS) IMPLANT
MANIFOLD NEPTUNE II (INSTRUMENTS) ×2 IMPLANT
NDL SIDE PORT 18GA QUICK PRESS (NEEDLE) IMPLANT
NS IRRIG 1000ML POUR BTL (IV SOLUTION) ×2 IMPLANT
PACK TOTAL KNEE CUSTOM (KITS) ×2 IMPLANT
PIN STEINMAN FIXATION KNEE (PIN) IMPLANT
PROTECTOR NERVE ULNAR (MISCELLANEOUS) ×2 IMPLANT
SET HNDPC FAN SPRY TIP SCT (DISPOSABLE) ×2 IMPLANT
STAPLER VISISTAT 35W (STAPLE) IMPLANT
STRIP CLOSURE SKIN 1/2X4 (GAUZE/BANDAGES/DRESSINGS) ×4 IMPLANT
SUT MNCRL AB 3-0 PS2 18 (SUTURE) ×2 IMPLANT
SUT VIC AB 0 CT1 36 (SUTURE) ×2 IMPLANT
SUT VIC AB 1 CT1 36 (SUTURE) ×4 IMPLANT
SUT VIC AB 2-0 CT1 27 (SUTURE) ×2
SUT VIC AB 2-0 CT1 TAPERPNT 27 (SUTURE) ×2 IMPLANT
SYR 27GX1/2 1ML LL SAFETY (SYRINGE) IMPLANT
TIBIAL BASE ROT PLAT SZ 7 KNEE (Knees) ×1 IMPLANT
TRAY CATH INTERMITTENT SS 16FR (CATHETERS) ×2 IMPLANT
WATER STERILE IRR 1000ML POUR (IV SOLUTION) ×4 IMPLANT
WRAP KNEE MAXI GEL POST OP (GAUZE/BANDAGES/DRESSINGS) IMPLANT
YANKAUER SUCT BULB TIP NO VENT (SUCTIONS) ×2 IMPLANT

## 2023-02-10 NOTE — Evaluation (Signed)
Physical Therapy One Time Evaluation Patient Details Name: Chase Harris MRN: [id number] DOB: [birth year] Today's Date: 02/10/2023  History of Present Illness  Pt is a 62 year old male s/p Left TKA on 02/10/23.  PMHx significant for but not limited to: diabetes, peripheral neuropathy, PVD, HTN  Clinical Impression  Patient evaluated by Physical Therapy in PACU with no further acute PT needs identified. All education has been completed and the patient has no further questions.  Pt educated on ambulating with RW, safe stair technique and exercises.  Spouse present and observed.  Pt and spouse had no further questions, and pt very eager to d/c home today.  Pt provided with HEP handout and plans to f/u with OPPT. PT is signing off. Thank you for this referral.        Recommendations for follow up therapy are one component of a multi-disciplinary discharge planning process, led by the attending physician.  Recommendations may be updated based on patient status, additional functional criteria and insurance authorization.  Follow Up Recommendations       Assistance Recommended at Discharge PRN  Patient can return home with the following  Help with stairs or ramp for entrance    Equipment Recommendations Rolling walker (2 wheels)  Recommendations for Other Services       Functional Status Assessment Patient has had a recent decline in their functional status and demonstrates the ability to make significant improvements in function in a reasonable and predictable amount of time.     Precautions / Restrictions Precautions Precautions: Fall;Knee Precaution Comments: pt and spouse educated on KI Required Braces or Orthoses: Knee Immobilizer - Left Knee Immobilizer - Left: Discontinue once straight leg raise with < 10 degree lag Restrictions Other Position/Activity Restrictions: WBAT      Mobility  Bed Mobility Overal bed mobility: Needs Assistance Bed Mobility: Supine to Sit      Supine to sit: Min guard, HOB elevated     General bed mobility comments: cues for technique    Transfers Overall transfer level: Needs assistance Equipment used: Rolling walker (2 wheels) Transfers: Sit to/from Stand Sit to Stand: Min guard           General transfer comment: verbal cues for UE and LE positioning for pain control    Ambulation/Gait Ambulation/Gait assistance: Min guard Gait Distance (Feet): 80 Feet Assistive device: Rolling walker (2 wheels) Gait Pattern/deviations: Step-to pattern, Decreased stance time - left, Antalgic Gait velocity: decr     General Gait Details: verbal cues for sequence, RW positioning, posture, step length  Stairs Stairs: Yes Stairs assistance: Min guard Stair Management: Step to pattern, Forwards, Two rails Number of Stairs: 2 General stair comments: verbal cues for sequence, RW placement, safety; spouse present and observed; pt reports understanding  Wheelchair Mobility    Modified Rankin (Stroke Patients Only)       Balance                                             Pertinent Vitals/Pain Pain Assessment Pain Assessment: No/denies pain    Home Living Family/patient expects to be discharged to:: Private residence Living Arrangements: Spouse/significant other Available Help at Discharge: Family Type of Home: House Home Access: Stairs to enter Entrance Stairs-Rails: Left;Right;Can reach both Entrance Stairs-Number of Steps: 3-4   Home Layout: One level Home Equipment: None  Prior Function Prior Level of Function : Independent/Modified Independent                     Hand Dominance        Extremity/Trunk Assessment        Lower Extremity Assessment Lower Extremity Assessment: LLE deficits/detail LLE Deficits / Details: able to perform SLR and ankle pumps, denies numbness, left knee AAROM approx 4-40* LLE Sensation: history of peripheral neuropathy        Communication   Communication: No difficulties  Cognition Arousal/Alertness: Awake/alert Behavior During Therapy: WFL for tasks assessed/performed Overall Cognitive Status: Within Functional Limits for tasks assessed                                          General Comments      Exercises Total Joint Exercises Ankle Circles/Pumps: AROM, Both, 10 reps Quad Sets: AROM, Left, 10 reps Heel Slides: AAROM, Left, 10 reps Hip ABduction/ADduction: AAROM, Left, 5 reps Straight Leg Raises: AAROM, Left, 5 reps Knee Flexion: Left, 5 reps, Seated, AROM   Assessment/Plan    PT Assessment All further PT needs can be met in the next venue of care  PT Problem List Decreased strength;Decreased range of motion;Decreased mobility;Decreased knowledge of precautions;Decreased knowledge of use of DME;Pain       PT Treatment Interventions      PT Goals (Current goals can be found in the Care Plan section)  Acute Rehab PT Goals PT Goal Formulation: All assessment and education complete, DC therapy    Frequency       Co-evaluation               AM-PAC PT "6 Clicks" Mobility  Outcome Measure Help needed turning from your back to your side while in a flat bed without using bedrails?: A Little Help needed moving from lying on your back to sitting on the side of a flat bed without using bedrails?: A Little Help needed moving to and from a bed to a chair (including a wheelchair)?: A Little Help needed standing up from a chair using your arms (e.g., wheelchair or bedside chair)?: A Little Help needed to walk in hospital room?: A Little Help needed climbing 3-5 steps with a railing? : A Little 6 Click Score: 18    End of Session Equipment Utilized During Treatment: Gait belt;Left knee immobilizer Activity Tolerance: Patient tolerated treatment well Patient left: in chair;with call bell/phone within reach;with family/visitor present Nurse Communication: Mobility status PT  Visit Diagnosis: Difficulty in walking, not elsewhere classified (R26.2)    Time: 1610-9604 PT Time Calculation (min) (ACUTE ONLY): 24 min   Charges:   PT Evaluation $PT Eval Low Complexity: 1 Low PT Treatments $Gait Training: 8-22 mins       Paulino Door, DPT Physical Therapist Acute Rehabilitation Services Office: [phone]   Chase Harris 02/10/2023, 1:46 PM

## 2023-02-10 NOTE — Progress Notes (Signed)
Orthopedic Tech Progress Note Patient Details:  Chase Harris 07-02-1961 [id number]  Ortho Devices Type of Ortho Device: Bone foam zero knee Ortho Device/Splint Location: LLE Ortho Device/Splint Interventions: Application   Post Interventions Patient Tolerated: Well  [name] 02/10/2023, 10:11 AM

## 2023-02-10 NOTE — Anesthesia Procedure Notes (Signed)
Spinal  Patient location during procedure: OR Start time: 02/10/2023 7:15 AM End time: 02/10/2023 7:19 AM Reason for block: surgical anesthesia Staffing Performed: resident/CRNA  Resident/CRNA: Nelle Don, CRNA Performed by: Nelle Don, CRNA Authorized by: Atilano Median, DO   Preanesthetic Checklist Completed: patient identified, IV checked, risks and benefits discussed, surgical consent, monitors and equipment checked, pre-op evaluation and timeout performed Spinal Block Patient position: sitting Prep: DuraPrep Patient monitoring: heart rate, cardiac monitor, continuous pulse ox and blood pressure Approach: midline Location: L3-4 Injection technique: single-shot Needle Needle type: Pencan  Needle gauge: 24 G Needle length: 10 cm Additional Notes Expiration date of kit checked. Clear CSF flow prior to injection. Dr Nance Pew present.

## 2023-02-10 NOTE — Anesthesia Procedure Notes (Signed)
Procedure Name: MAC Date/Time: 02/10/2023 7:15 AM  Performed by: Nelle Don, CRNAPre-anesthesia Checklist: Patient identified, Emergency Drugs available, Suction available and Patient being monitored Oxygen Delivery Method: Simple face mask

## 2023-02-10 NOTE — Anesthesia Postprocedure Evaluation (Signed)
Anesthesia Post Note  Patient: Chase Harris  Procedure(s) Performed: TOTAL KNEE ARTHROPLASTY (Left: Knee)     Patient location during evaluation: PACU Anesthesia Type: Regional and General Level of consciousness: awake and alert Pain management: pain level controlled Vital Signs Assessment: post-procedure vital signs reviewed and stable Respiratory status: spontaneous breathing, nonlabored ventilation, respiratory function stable and patient connected to nasal cannula oxygen Cardiovascular status: blood pressure returned to baseline and stable Postop Assessment: no apparent nausea or vomiting Anesthetic complications: no   No notable events documented.  Last Vitals:  Vitals:   02/10/23 1230 02/10/23 1300  BP: (!) 157/75 (!) 158/81  Pulse: 93 94  Resp:    Temp:    SpO2: 96% 94%    Last Pain:  Vitals:   02/10/23 1151  TempSrc:   PainSc: 3                  Kanika Bungert P Mckinsley Koelzer

## 2023-02-10 NOTE — Anesthesia Procedure Notes (Signed)
Anesthesia Regional Block: Adductor canal block   Pre-Anesthetic Checklist: , timeout performed,  Correct Patient, Correct Site, Correct Laterality,  Correct Procedure, Correct Position, site marked,  Risks and benefits discussed,  Surgical consent,  Pre-op evaluation,  At surgeon's request and post-op pain management  Laterality: Left  Prep: Dura Prep       Needles:  Injection technique: Single-shot  Needle Type: Echogenic Stimulator Needle     Needle Length: 10cm  Needle Gauge: 20     Additional Needles:   Procedures:,,,, ultrasound used (permanent image in chart),,    Narrative:  Start time: 02/10/2023 6:57 AM End time: 02/10/2023 7:00 AM Injection made incrementally with aspirations every 5 mL.  Performed by: Personally  Anesthesiologist: Atilano Median, DO  Additional Notes: Patient identified. Risks/Benefits/Options discussed with patient including but not limited to bleeding, infection, nerve damage, failed block, incomplete pain control. Patient expressed understanding and wished to proceed. All questions were answered. Sterile technique was used throughout the entire procedure. Please see nursing notes for vital signs. Aspirated in 5cc intervals with injection for negative confirmation. Patient was given instructions on fall risk and not to get out of bed. All questions and concerns addressed with instructions to call with any issues or inadequate analgesia.

## 2023-02-10 NOTE — Transfer of Care (Signed)
Immediate Anesthesia Transfer of Care Note  Patient: Chase Harris  Procedure(s) Performed: TOTAL KNEE ARTHROPLASTY (Left: Knee)  Patient Location: PACU  Anesthesia Type:Spinal  Level of Consciousness: awake, alert , and oriented  Airway & Oxygen Therapy: Patient Spontanous Breathing and Patient connected to face mask oxygen  Post-op Assessment: Report given to RN and Post -op Vital signs reviewed and stable  Post vital signs: Reviewed and stable  Last Vitals:  Vitals Value Taken Time  BP 118/71 02/10/23 0921  Temp    Pulse 88   Resp 15 02/10/23 0922  SpO2 99   Vitals shown include unvalidated device data.  Last Pain:  Vitals:   02/10/23 0557  TempSrc: Oral  PainSc:       Patients Stated Pain Goal: 4 (02/10/23 0546)  Complications: No notable events documented.

## 2023-02-10 NOTE — Op Note (Signed)
NAME: Chase Harris, Chase Harris MEDICAL RECORD NO: [id number] ACCOUNT NO: [id number] DATE OF BIRTH: September 10, 1961 FACILITY: [facility] LOCATION: [facility] PHYSICIAN: Almedia Balls. Ranell Patrick, MD  Operative Report   DATE OF PROCEDURE: 02/10/2023  PREOPERATIVE DIAGNOSIS:  Left knee end-stage arthritis.  POSTOPERATIVE DIAGNOSIS:  Left knee end-stage arthritis.  PROCEDURE PERFORMED:  Left total knee arthroplasty using DePuy Attune prosthesis.  ATTENDING SURGEON:  Almedia Balls. Ranell Patrick, MD.  ASSISTANT:  Konrad Felix Dixon, [place], who was scrubbed during the entire procedure, and necessary for satisfactory completion of surgery.  ANESTHESIA:  Spinal anesthesia plus adductor canal block was utilized.  ESTIMATED BLOOD LOSS:  Minimal.  FLUID REPLACEMENT:  1500 mL of crystalloid.  COUNTS:  Instrument counts correct.  COMPLICATIONS:  There are no complications.    ANTIBIOTICS:  Perioperative antibiotics were given.  TOURNIQUET TIME:  One hour and 20 minutes at 300 mmHg.  INDICATIONS:  The patient is a 62 year old male, who presents with a history of worsening left knee pain secondary to end-stage arthritis, bone-on-bone.  The patient has had a prior knee arthroscopy.  He has failed to conservative treatment including  numerous injections and therapy.  The patient presents now for total knee arthroplasty in order to restore function and quality of life and eliminate pain.  Informed consent obtained.  DESCRIPTION OF PROCEDURE:  After an adequate level of spinal anesthesia plus adductor canal block was utilized and established, the patient positioned supine on the operating room table.  A nonsterile tourniquet placed on proximal thigh.  Left leg  sterilely prepped and draped in the usual manner.  Timeout called, verifying correct patient, correct site.  We elevated the leg and exsanguinated with an Esmarch bandage, inflating the tourniquet to 300 mmHg, placed the knee in flexion and performed a  longitudinal midline  incision with a 10 blade scalpel.  A fresh 10 blade was utilized for the parapatellar arthrotomy on the medial side.  We then everted the patella, dividing lateral patellofemoral ligaments.  We exposed the distal femur.  There was  bone-on-bone with eburnated bone in the distal femur and the tibia on the medial compartment.  We drilled a hole in the distal femur with a step cut drill.  We then placed our intramedullary distal femoral guide, set on 5 degrees of valgus and 9 mm  resection and we used an oscillating saw to resect the distal femur.  We then sized the femur to a size 6 anterior down performing anterior, posterior and chamfer cuts with the 4-in-1 block.  Next, we removed ACL, PCL meniscal tissues, subluxing the  tibia anteriorly.  We then performed our tibial cut 90 degrees perpendicular to long axis of tibia with the oscillating saw with minimal posterior slope.  Once we had our tibial cut done, we used a lamina spreader and we removed posterior femoral condyle  osteophytes using a curved osteotome.  We then injected the posterior capsule with a combination of Marcaine, Exparel and saline.  Next, we checked our gaps, which were symmetric at 5 mm.  We then completed our tibial preparation with the modular drill  and keel punch for the 7 tibia.  We then did our box cut for the 6 left femur.  Once we had our trials in place, we trialled with a 5 mm poly spacer.  We had excellent range of motion, full extension and good stability in both flexion and extension.  We  then went to the patella and resurfaced going from a 27 mm thickness down to 17  mm thickness with the oscillating saw using the patellar cutting guide.  We drilled lug holes for the 38 patellar button and trialled with a 38 trial patella.  We ranged the  knee and had excellent patellar tracking.  We did do just a minimal lateral release as there was some slight tilt there, but that was resolved after the lateral release.  We removed all  trial components, irrigated thoroughly, vacuum mixed high viscosity  cement on the back table and cemented the components into place all in one step.  We placed the knee in extension with the 5 mm poly trial and allowed the cement to harden while we had good compression on all the components.  Once all the cement was set,  we removed excess cement with quarter-inch curved osteotome, inspecting the entirety of the knee.  We felt like we could get the 6 mm in, so we selected the real size 6, 6 mm poly, placed on the tibial tray and reduced the knee.  We did a thorough  irrigation.  We did inject the anterior capsule with Exparel, saline and Marcaine.  Once we had the final components in, we ranged the knee with excellent patellar tracking, excellent stability both in flexion and extension, nice little pop as that  medial condyle reduced on the final reduction, so balancing was excellent.  We then closed the parapatellar arthrotomy with #1 Vicryl suture, followed by 0 and 2-0 layered subcutaneous closure and 4-0 Monocryl for skin.  Steri-Strips applied followed by  sterile dressing.  The patient tolerated surgery well.   CHR D: 02/10/2023 9:15:38 am T: 02/10/2023 9:36:00 am  JOB: [id number]/ [id number]

## 2023-02-10 NOTE — Interval H&P Note (Signed)
History and Physical Interval Note:  02/10/2023 7:06 AM  Chase Harris  has presented today for surgery, with the diagnosis of left knee osteoarthritis.  The various methods of treatment have been discussed with the patient and family. After consideration of risks, benefits and other options for treatment, the patient has consented to  Procedure(s) with comments: TOTAL KNEE ARTHROPLASTY (Left) - choice with interscalene block as a surgical intervention.  The patient's history has been reviewed, patient examined, no change in status, stable for surgery.  I have reviewed the patient's chart and labs.  Questions were answered to the patient's satisfaction.     Verlee Rossetti

## 2023-02-10 NOTE — Brief Op Note (Signed)
02/10/2023  9:10 AM  PATIENT:  Chase Harris  62 y.o. male  PRE-OPERATIVE DIAGNOSIS:  left knee osteoarthritis, end stage  POST-OPERATIVE DIAGNOSIS:  left knee osteoarthritis, end stage  PROCEDURE:  Procedure(s) with comments: TOTAL KNEE ARTHROPLASTY (Left) - choice with interscalene block  DePuy Attune   SURGEON:  Surgeon(s) and Role:    Beverely Low, MD - Primary  PHYSICIAN ASSISTANT:   ASSISTANTS: Thea Gist, PA-C   ANESTHESIA:   regional and spinal  EBL:  minimal  BLOOD ADMINISTERED:none  DRAINS: none   LOCAL MEDICATIONS USED:  MARCAINE     SPECIMEN:  No Specimen  DISPOSITION OF SPECIMEN:  N/A  COUNTS:  YES  TOURNIQUET:  * Missing tourniquet times found for documented tourniquets in log: [id number] *  DICTATION: .Other Dictation: Dictation Number [id number]  PLAN OF CARE: Discharge to home after PACU  PATIENT DISPOSITION:  PACU - hemodynamically stable.   Delay start of Pharmacological VTE agent (>24hrs) due to surgical blood loss or risk of bleeding: no

## 2023-02-10 NOTE — Discharge Instructions (Signed)
Ice to the knee constantly.  Keep the incision covered and clean and dry for one week, then ok to get it wet in the shower.  Do exercise as instructed every hour, please to prevent stiffness.    DO NOT prop anything under the knee, it will make your knee stiff.  Prop under the ankle to encourage your knee to go straight.   Use the walker while you are up and around for balance.  Wear your support stockings 24/7 to prevent blood clots and take baby aspirin twice daily for 30 days also to prevent blood clots  Follow up with Dr Ranell Patrick in two weeks in the office, call [phone] for appt  Call Dr Ranell Patrick (cell) [phone] with any questions or concerns  INSTRUCTIONS AFTER JOINT REPLACEMENT   Remove items at home which could result in a fall. This includes throw rugs or furniture in walking pathways ICE to the affected joint every three hours while awake for 30 minutes at a time, for at least the first 3-5 days, and then as needed for pain and swelling.  Continue to use ice for pain and swelling. You may notice swelling that will progress down to the foot and ankle.  This is normal after surgery.  Elevate your leg when you are not up walking on it.   Continue to use the breathing machine you got in the hospital (incentive spirometer) which will help keep your temperature down.  It is common for your temperature to cycle up and down following surgery, especially at night when you are not up moving around and exerting yourself.  The breathing machine keeps your lungs expanded and your temperature down.   DIET:  As you were doing prior to hospitalization, we recommend a well-balanced diet.  DRESSING / WOUND CARE / SHOWERING  You may change your dressing 3-5 days after surgery.  Then change the dressing every day with sterile gauze.  Please use good hand washing techniques before changing the dressing.  Do not use any lotions or creams on the incision until instructed by your  surgeon.  ACTIVITY  Increase activity slowly as tolerated, but follow the weight bearing instructions below.   No driving for 6 weeks or until further direction given by your physician.  You cannot drive while taking narcotics.  No lifting or carrying greater than 10 lbs. until further directed by your surgeon. Avoid periods of inactivity such as sitting longer than an hour when not asleep. This helps prevent blood clots.  You may return to work once you are authorized by your doctor.     WEIGHT BEARING   Weight bearing as tolerated with assist device (walker, cane, etc) as directed, use it as long as suggested by your surgeon or therapist, typically at least 4-6 weeks.   EXERCISES  Results after joint replacement surgery are often greatly improved when you follow the exercise, range of motion and muscle strengthening exercises prescribed by your doctor. Safety measures are also important to protect the joint from further injury. Any time any of these exercises cause you to have increased pain or swelling, decrease what you are doing until you are comfortable again and then slowly increase them. If you have problems or questions, call your caregiver or physical therapist for advice.   Rehabilitation is important following a joint replacement. After just a few days of immobilization, the muscles of the leg can become weakened and shrink (atrophy).  These exercises are designed to build up the  tone and strength of the thigh and leg muscles and to improve motion. Often times heat used for twenty to thirty minutes before working out will loosen up your tissues and help with improving the range of motion but do not use heat for the first two weeks following surgery (sometimes heat can increase post-operative swelling).   These exercises can be done on a training (exercise) mat, on the floor, on a table or on a bed. Use whatever works the best and is most comfortable for you.    Use music or  television while you are exercising so that the exercises are a pleasant break in your day. This will make your life better with the exercises acting as a break in your routine that you can look forward to.   Perform all exercises about fifteen times, three times per day or as directed.  You should exercise both the operative leg and the other leg as well.  Exercises include:   Quad Sets - Tighten up the muscle on the front of the thigh (Quad) and hold for 5-10 seconds.   Straight Leg Raises - With your knee straight (if you were given a brace, keep it on), lift the leg to 60 degrees, hold for 3 seconds, and slowly lower the leg.  Perform this exercise against resistance later as your leg gets stronger.  Leg Slides: Lying on your back, slowly slide your foot toward your buttocks, bending your knee up off the floor (only go as far as is comfortable). Then slowly slide your foot back down until your leg is flat on the floor again.  Angel Wings: Lying on your back spread your legs to the side as far apart as you can without causing discomfort.  Hamstring Strength:  Lying on your back, push your heel against the floor with your leg straight by tightening up the muscles of your buttocks.  Repeat, but this time bend your knee to a comfortable angle, and push your heel against the floor.  You may put a pillow under the heel to make it more comfortable if necessary.   A rehabilitation program following joint replacement surgery can speed recovery and prevent re-injury in the future due to weakened muscles. Contact your doctor or a physical therapist for more information on knee rehabilitation.    CONSTIPATION  Constipation is defined medically as fewer than three stools per week and severe constipation as less than one stool per week.  Even if you have a regular bowel pattern at home, your normal regimen is likely to be disrupted due to multiple reasons following surgery.  Combination of anesthesia,  postoperative narcotics, change in appetite and fluid intake all can affect your bowels.   YOU MUST use at least one of the following options; they are listed in order of increasing strength to get the job done.  They are all available over the counter, and you may need to use some, POSSIBLY even all of these options:    Drink plenty of fluids (prune juice may be helpful) and high fiber foods Colace 100 mg by mouth twice a day  Senokot for constipation as directed and as needed Dulcolax (bisacodyl), take with full glass of water  Miralax (polyethylene glycol) once or twice a day as needed.  If you have tried all these things and are unable to have a bowel movement in the first 3-4 days after surgery call either your surgeon or your primary doctor.    If you experience loose  stools or diarrhea, hold the medications until you stool forms back up.  If your symptoms do not get better within 1 week or if they get worse, check with your doctor.  If you experience "the worst abdominal pain ever" or develop nausea or vomiting, please contact the office immediately for further recommendations for treatment.   ITCHING:  If you experience itching with your medications, try taking only a single pain pill, or even half a pain pill at a time.  You can also use Benadryl over the counter for itching or also to help with sleep.   TED HOSE STOCKINGS:  Use stockings on both legs until for at least 2 weeks or as directed by physician office. They may be removed at night for sleeping.  MEDICATIONS:  See your medication summary on the "After Visit Summary" that nursing will review with you.  You may have some home medications which will be placed on hold until you complete the course of blood thinner medication.  It is important for you to complete the blood thinner medication as prescribed.  PRECAUTIONS:  If you experience chest pain or shortness of breath - call 911 immediately for transfer to the hospital emergency  department.   If you develop a fever greater that 101 F, purulent drainage from wound, increased redness or drainage from wound, foul odor from the wound/dressing, or calf pain - CONTACT YOUR SURGEON.                                                   FOLLOW-UP APPOINTMENTS:  If you do not already have a post-op appointment, please call the office for an appointment to be seen by your surgeon.  Guidelines for how soon to be seen are listed in your "After Visit Summary", but are typically between 1-4 weeks after surgery.  OTHER INSTRUCTIONS:   Knee Replacement:  Do not place pillow under knee, focus on keeping the knee straight while resting. CPM instructions: 0-90 degrees, 2 hours in the morning, 2 hours in the afternoon, and 2 hours in the evening. Place foam block, curve side up under heel at all times except when in CPM or when walking.  DO NOT modify, tear, cut, or change the foam block in any way.  POST-OPERATIVE OPIOID TAPER INSTRUCTIONS: It is important to wean off of your opioid medication as soon as possible. If you do not need pain medication after your surgery it is ok to stop day one. Opioids include: Codeine, Hydrocodone(Norco, Vicodin), Oxycodone(Percocet, oxycontin) and hydromorphone amongst others.  Long term and even short term use of opiods can cause: Increased pain response Dependence Constipation Depression Respiratory depression And more.  Withdrawal symptoms can include Flu like symptoms Nausea, vomiting And more Techniques to manage these symptoms Hydrate well Eat regular healthy meals Stay active Use relaxation techniques(deep breathing, meditating, yoga) Do Not substitute Alcohol to help with tapering If you have been on opioids for less than two weeks and do not have pain than it is ok to stop all together.  Plan to wean off of opioids This plan should start within one week post op of your joint replacement. Maintain the same interval or time between taking  each dose and first decrease the dose.  Cut the total daily intake of opioids by one tablet each day Next start to increase the  time between doses. The last dose that should be eliminated is the evening dose.   MAKE SURE YOU:  Understand these instructions.  Get help right away if you are not doing well or get worse.    Thank you for letting us be a part of your medical care team.  It is a privilege we respect greatly.  We hope these instructions will help you stay on track for a fast and full recovery!

## 2023-03-10 ENCOUNTER — Other Ambulatory Visit: Payer: Self-pay | Admitting: Podiatry

## 2023-04-24 ENCOUNTER — Ambulatory Visit (INDEPENDENT_AMBULATORY_CARE_PROVIDER_SITE_OTHER): Payer: Medicare Other | Admitting: Vascular Surgery

## 2023-06-30 ENCOUNTER — Other Ambulatory Visit: Payer: Self-pay | Admitting: Nephrology

## 2023-06-30 DIAGNOSIS — I129 Hypertensive chronic kidney disease with stage 1 through stage 4 chronic kidney disease, or unspecified chronic kidney disease: Secondary | ICD-10-CM

## 2023-06-30 DIAGNOSIS — R8290 Unspecified abnormal findings in urine: Secondary | ICD-10-CM

## 2023-06-30 DIAGNOSIS — N1832 Chronic kidney disease, stage 3b: Secondary | ICD-10-CM

## 2023-07-06 ENCOUNTER — Other Ambulatory Visit: Payer: Self-pay | Admitting: Nephrology

## 2023-07-06 DIAGNOSIS — R8290 Unspecified abnormal findings in urine: Secondary | ICD-10-CM

## 2023-07-06 DIAGNOSIS — N1832 Chronic kidney disease, stage 3b: Secondary | ICD-10-CM

## 2023-07-12 ENCOUNTER — Ambulatory Visit
Admission: RE | Admit: 2023-07-12 | Discharge: 2023-07-12 | Disposition: A | Discharge status: Home / Self Care | Payer: Medicare Other | Source: Ambulatory Visit | Attending: Nephrology | Admitting: Nephrology

## 2023-07-12 DIAGNOSIS — N1832 Chronic kidney disease, stage 3b: Secondary | ICD-10-CM | POA: Insufficient documentation

## 2023-07-12 DIAGNOSIS — R8290 Unspecified abnormal findings in urine: Secondary | ICD-10-CM | POA: Diagnosis present

## 2023-07-12 DIAGNOSIS — I129 Hypertensive chronic kidney disease with stage 1 through stage 4 chronic kidney disease, or unspecified chronic kidney disease: Secondary | ICD-10-CM | POA: Diagnosis present

## 2023-08-02 ENCOUNTER — Ambulatory Visit
Admission: RE | Admit: 2023-08-02 | Discharge: 2023-08-02 | Disposition: A | Discharge status: Home / Self Care | Payer: Medicare Other | Source: Ambulatory Visit | Attending: Nephrology | Admitting: Nephrology

## 2023-08-02 DIAGNOSIS — I10 Essential (primary) hypertension: Secondary | ICD-10-CM

## 2023-08-02 DIAGNOSIS — N1832 Chronic kidney disease, stage 3b: Secondary | ICD-10-CM | POA: Insufficient documentation

## 2023-08-02 DIAGNOSIS — I129 Hypertensive chronic kidney disease with stage 1 through stage 4 chronic kidney disease, or unspecified chronic kidney disease: Secondary | ICD-10-CM | POA: Insufficient documentation

## 2023-08-02 DIAGNOSIS — R8290 Unspecified abnormal findings in urine: Secondary | ICD-10-CM | POA: Insufficient documentation

## 2023-08-02 DIAGNOSIS — E1122 Type 2 diabetes mellitus with diabetic chronic kidney disease: Secondary | ICD-10-CM | POA: Diagnosis not present

## 2023-08-02 LAB — ECHOCARDIOGRAM COMPLETE
AR max vel: 4.13 cm2
AV Area VTI: 4.43 cm2
AV Area mean vel: 4.49 cm2
AV Mean grad: 2.5 mm[Hg]
AV Peak grad: 4.7 mm[Hg]
Ao pk vel: 1.09 m/s
Area-P 1/2: 13.55 cm2
S' Lateral: 3.2 cm

## 2023-08-02 NOTE — Progress Notes (Signed)
*  PRELIMINARY RESULTS* Echocardiogram 2D Echocardiogram has been performed.  Cristela Blue 08/02/2023, 9:32 AM

## 2023-08-21 ENCOUNTER — Other Ambulatory Visit: Payer: Self-pay | Admitting: Podiatry

## 2023-09-22 ENCOUNTER — Encounter: Payer: Self-pay | Admitting: Podiatry

## 2023-09-22 ENCOUNTER — Ambulatory Visit: Payer: Medicare Other | Admitting: Podiatry

## 2023-09-22 DIAGNOSIS — I739 Peripheral vascular disease, unspecified: Secondary | ICD-10-CM | POA: Diagnosis not present

## 2023-09-22 DIAGNOSIS — E1149 Type 2 diabetes mellitus with other diabetic neurological complication: Secondary | ICD-10-CM

## 2023-09-22 DIAGNOSIS — E119 Type 2 diabetes mellitus without complications: Secondary | ICD-10-CM

## 2023-09-22 DIAGNOSIS — M205X2 Other deformities of toe(s) (acquired), left foot: Secondary | ICD-10-CM

## 2023-09-22 DIAGNOSIS — M205X1 Other deformities of toe(s) (acquired), right foot: Secondary | ICD-10-CM

## 2023-09-22 MED ORDER — GABAPENTIN 600 MG PO TABS: 600.0000 mg | ORAL_TABLET | Freq: Three times a day (TID) | ORAL | 0 refills | Status: DC

## 2023-09-22 NOTE — Progress Notes (Unsigned)
Subjective: Chief Complaint  Patient presents with   [facility]    RM#11 [facility] needs refill on gabapentin.     62 year old male presents the above concerns.  He missed the last appointment as he had a knee replacement. He gets worsening neruopathy at night, gabapentin use ot help  States he has been doing well this time and has not had any new concerns.  Neuropathy has been about stable no recent injury or changes.  No open lesions.   Objective: AAO x3, NAD DP/PT pulses palpable bilaterally, CRT less than 3 seconds Nails are mildly elongated but not significantly not causing significant pain.  No edema, erythema or signs of infection.  There is no open lesions.  He gets chronic discomfort submetatarsal 5 bilaterally there is no open lesions or callus formation.  There is prominence of metatarsal heads plantarly with atrophy the fat pad.  MMT 5/5. No pain with calf compression, swelling, warmth, erythema  Assessment: Capsulitis, possible gout left foot  Plan: -All treatment options discussed with the patient including all alternatives, risks, complications.   -Patient encouraged to call the office with any questions, concerns, change in symptoms.  -As a courtesy I debrided a few of the nails that were mildly elongated without any complications or bleeding.  He seems to be doing well.  Discussed continue shoes with good arch supports and daily foot inspection.  Glucose control.  He is previously follow-up with vascular surgery regards to her circulation as well.  Neuropathy appears to be stable.  Will continue to monitor.  Vivi Barrack DPM

## 2023-12-18 ENCOUNTER — Other Ambulatory Visit: Payer: Self-pay | Admitting: Podiatry

## 2024-02-13 ENCOUNTER — Encounter (INDEPENDENT_AMBULATORY_CARE_PROVIDER_SITE_OTHER): Payer: Self-pay

## 2024-03-26 ENCOUNTER — Ambulatory Visit: Payer: Medicare Other | Admitting: Podiatry

## 2024-04-01 ENCOUNTER — Other Ambulatory Visit: Payer: Self-pay | Admitting: Podiatry

## 2024-04-22 ENCOUNTER — Ambulatory Visit (INDEPENDENT_AMBULATORY_CARE_PROVIDER_SITE_OTHER): Admitting: Podiatry

## 2024-04-22 DIAGNOSIS — M205X1 Other deformities of toe(s) (acquired), right foot: Secondary | ICD-10-CM | POA: Diagnosis not present

## 2024-04-22 DIAGNOSIS — E119 Type 2 diabetes mellitus without complications: Secondary | ICD-10-CM | POA: Diagnosis not present

## 2024-04-22 DIAGNOSIS — M7742 Metatarsalgia, left foot: Secondary | ICD-10-CM

## 2024-04-22 DIAGNOSIS — E1149 Type 2 diabetes mellitus with other diabetic neurological complication: Secondary | ICD-10-CM

## 2024-04-22 NOTE — Patient Instructions (Signed)

## 2024-04-22 NOTE — Progress Notes (Unsigned)
 Subjective: Chief Complaint  Patient presents with   Nail Problem    Pt stated that he is here to have his nails trimmed and just a foot exam     63 year old male presents the above concerns.  States that he gets pain along the pad of the left foot and also on the right fifth toe where the toe does rub.  No recent injuries that he reports.  No open lesions.  Objective: AAO x3, NAD DP/PT pulses palpable bilaterally, CRT less than 3 seconds Sensation decreased with Semmes Weinstein monofilament. Nails are mildly elongated but not significantly not causing significant pain.  Adductovarus noted of the toes of the right side worse than left on the fifth digit.  There is no open lesions or any area pinpoint tenderness.  He does get discomfort left foot submetatarsal 5.  There is no area pinpoint tenderness otherwise.  No edema, erythema. No pain with calf compression, swelling, warmth, erythema  Assessment: 63 year old male with neuropathy, diabetic foot exam, adductovarus fifth toe, capsulitis  Plan: -All treatment options discussed with the patient including all alternatives, risks, complications.  -Plan continue gabapentin  for neuropathy.  -Dispensed offloading pads of the fifth toes.  Dispensed toe separators today. -Offered steroid injection for the left foot submetatarsal 5 area.  He is can be having a vacation in September and if symptoms persist prior to that for an injection.  He has inserts at home that he has not been wearing encouraged to start wearing these again to help decrease the pressure. 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

## 2024-05-28 NOTE — Progress Notes (Signed)
 Chief Complaint  Patient presents with  . Hospital Follow Up    HPI  Chase Harris is a 63 y.o. here for hospital follow-up  Hospital follow-up: Patient [place] that [place]  when he started having a presyncopal episode while driving his son.  He went to a local urgent care and was then transferred to a local hospital for further evaluation.  At that time, patient was noted to have abnormal cardiac workup and was transferred to a hospital in [place]  for CABG x 4 on 05/10/2024.  Denies any exertional chest pain.  States that he is recovering well but still short of breath.  He was also noted to have atrial fibrillation and was started on Eliquis and amiodarone.  Has plans to follow-up with Dr. Gollan this month to establish care.  Tolerating all medications without bleeding.  Not currently on a statin.  Most recent LDL 92 (05/04/2024).  ROS Review of systems is unremarkable for any active cardiac, respiratory, GI, GU, hematologic, neurologic, dermatologic, HEENT, or psychiatric symptoms except as noted above.  No fevers, chills, or constitutional symptoms.   Current Outpatient Medications  Medication Sig Dispense Refill  . allopurinoL (ZYLOPRIM) 300 MG tablet Take 1 tablet (300 mg total) by mouth once daily 90 tablet 1  . AMIOdarone (PACERONE) 200 MG tablet Take 200 mg by mouth 3 (three) times daily X 2 weeks    . apixaban (ELIQUIS) 5 mg tablet Take 5 mg by mouth every 12 (twelve) hours    . aspirin  81 MG EC tablet Take 81 mg by mouth once daily.    . baclofen (LIORESAL) 10 MG tablet 1 PO TID PRN 15 tablet 0  . dapagliflozin propanediol (FARXIGA) 10 mg tablet Take 1 tablet (10 mg total) by mouth every morning 90 tablet 3  . dilTIAZem (CARDIZEM CD) 120 MG XR capsule Take 120 mg by mouth    . etanercept (ENBREL) 50 mg/mL (1 mL) pen injector Inject 1 mL (50 mg total) subcutaneously once a week 12 mL 1  . fenofibrate 160 MG tablet TAKE ONE TABLET BY  MOUTH ONCE A DAY 90 tablet 1  . flash glucose scanning (FREESTYLE LIBRE 2 READER) reader Use 1 Device as directed    . flash glucose sensor (FREESTYLE LIBRE 2 SENSOR) kit Use 1 kit for glucose monitoring    . fluticasone propionate (FLONASE) 50 mcg/actuation nasal spray PLACE TWO SPRAYS INTO BOTH NOSTRILS ONCE DAILY AS NEEDED FOR RHINITIS 48 g 0  . FUROsemide  (LASIX ) 20 MG tablet Take 40 mg by mouth 2 (two) times daily    . gabapentin  (NEURONTIN ) 600 MG tablet Take 600 mg by mouth 3 (three) times daily    . hydroxychloroquine (PLAQUENIL) 200 mg tablet Take 1 tablet (200 mg total) by mouth 2 (two) times daily 180 tablet 1  . imipramine (TOFRANIL) 50 MG tablet Take 1 tablet (50 mg total) by mouth at bedtime 90 tablet 3  . insulin  ASPART (NOVOLOG  FLEXPEN U-100 INSULIN ) pen injector (concentration 100 units/mL) Inject 45 Units subcutaneously 3 (three) times daily with meals (Gets from [employer] patient assistance).    . insulin  DEGLUDEC (TRESIBA FLEXTOUCH U-200) pen injector (concentration 200 units/mL) Inject 140 Units subcutaneously at bedtime 3 mL 12  . loratadine (CLARITIN) 10 mg tablet Take 10 mg by mouth once daily.    . metFORMIN (GLUCOPHAGE) 1000 MG tablet Take 1 tablet (1,000 mg total) by mouth 2 (two) times daily with meals 180 tablet 3  .  metoprolol  TARTrate (LOPRESSOR ) 50 MG tablet Take 50 mg by mouth 2 (two) times daily    . MICROLET LANCET USE AS DIRECTED TWICE A DAY 200 each 1  . omeprazole (PRILOSEC) 40 MG DR capsule Take 1 capsule (40 mg total) by mouth once daily 90 capsule 3  . oxyCODONE  (ROXICODONE ) 5 MG immediate release tablet Take 5 mg by mouth every 6 (six) hours as needed for Pain    . potassium chloride (KLOR-CON M20) 20 MEQ ER tablet Take 20 mEq by mouth 2 (two) times daily    . spironolactone  (ALDACTONE ) 25 MG tablet Take 25 mg by mouth once daily    . telmisartan (MICARDIS) 40 MG tablet Take 1 tablet (40 mg total) by mouth once daily    . traMADoL  (ULTRAM ) 50 mg tablet  Take 1 tablet (50 mg total) by mouth every 12 (twelve) hours 60 tablet 2  . ULTICARE PEN NEEDLE 31 gauge x 5/16 needle USE FOUR TIMES DAILY AS DIRECTEED 400 each 1  . REPATHA SURECLICK 140 mg/mL PnIj Inject 140 mg subcutaneously every 14 (fourteen) days 2 mL 3   No current facility-administered medications for this visit.    Allergies as of 05/28/2024 - Reviewed 05/28/2024  Allergen Reaction Noted  . Doxycycline  Hives 02/17/2017  . Statins-hmg-coa reductase inhibitors Muscle Pain 11/25/2020  . Dynabac [dirithromycin] Abdominal Pain 02/24/2014  . Penicillins Rash 02/24/2014    Patient Active Problem List  Diagnosis  . Insulin  dependent type 2 diabetes mellitus (A1c 7.7% - 11/17/22) - followed by Dr. Damian  . OSA on CPAP  . History of prostate cancer - previously followed by Dr. Waddell  . Non morbid obesity due to excess calories  . Essential hypertension with goal blood pressure less than 140/90  . Mixed hyperlipidemia (LDL 92 - 05/04/24)  . Seronegative rheumatoid arthritis (CMS-HCC) - followed by Dr. Tobie  . Bilateral hand pain  . 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 DM type 2 with diabetic peripheral neuropathy (CMS/HHS-HCC)  . Type 2 diabetes mellitus with vascular disease (CMS/HHS-HCC)  . Localized osteoarthritis of right knee  . Idiopathic chronic gout of right foot without tophus  . Medicare annual wellness visit, initial 12/08/21  . Fatty liver disease, nonalcoholic (02/2021)  . Medicare annual wellness visit, subsequent 01/31/23  . Coronary artery disease involving coronary bypass graft of native heart without angina pectoris (s/p CABG x4 04/2024) - followed by Dr. Gollan  . Atrial fibrillation, new onset (04/2024) on Eliquis - followed by Dr. Perla    Past Medical History:  Diagnosis Date  . Allergic rhinitis   . Arthritis   . Atrial fibrillation,  new onset (04/2024) on Eliquis - followed by Dr. Gollan 05/28/2024  . Chronic back pain   . Constipation   . Coronary artery disease involving coronary bypass graft of native heart without angina pectoris (s/p CABG x4 04/2024) - followed by Dr. Gollan 05/28/2024  . Diabetic peripheral neuropathy (CMS/HHS-HCC)   . Diabetic retinopathy associated with type 2 diabetes mellitus (CMS/HHS-HCC)   . GERD (gastroesophageal reflux disease)   . History of prostate cancer   . HLD (hyperlipidemia)   . HTN (hypertension)   . Microalbuminuria   . Obesity   . Prostate cancer   . Sleep apnea   . Stroke (CMS/HHS-HCC)   . Type 2 diabetes mellitus (CMS/HHS-HCC)    with PVD, peripheral neuropathy, microalbuminuria, and retinopathy  Past Surgical History:  Procedure Laterality Date  . COLONOSCOPY  08/02/2012   Adenomatous Polyp, FH Colon Polyps (Father/Sister): [facility] 07/2017  . hardware removed from tre back of his foot Left 04/26/2017  . foot surgery Left 04/26/2017  . COLONOSCOPY  06/27/2018   PH Adenomatous Polyp; FHCP (Father/Sister) [facility] 06/2023  . EGD  06/27/2018   Gastritis: No repeat per RTE  . left knee surgery Left 02/2022  . BLEPHAROPLASTY    . ENDOSCOPIC CARPAL TUNNEL RELEASE    . HERNIA REPAIR    . UMBILICAL HERNIA REPAIR      Vitals:   05/28/24 1226  BP: 138/70  Pulse: 83  SpO2: 99%  Weight: (!) 120.4 kg (265 lb 6.4 oz)  Height: 175.3 cm (5' 9)  PainSc:   6  PainLoc: Hand   Body mass index is 39.19 kg/m.  Exam  General. Well appearing; NAD; VS reviewed     Eyes. Sclera and conjunctiva clear; Vision grossly intact; extraocular movements intact Neck. Supple.   Lungs. Respirations unlabored; clear to auscultation bilaterally; no crackles Cardiovascular. Heart regular rate and rhythm without murmurs, gallops, or rubs Skin.  Surgical wounds appear to be healing without any signs of infection Neurologic. Alert and oriented x3; CN 2-12 grossly intact; no focal deficits Extremity.   1+ edema bilateral ankles  Assessment and Plan  1. Coronary artery disease involving coronary bypass graft of native heart without angina pectoris (s/p CABG x4 04/2024) - followed by Dr. Gollan New.  Reviewed outside hospital notes, labs, and studies.  Patient intolerant to statins.  Plan to start on Repatha 140 mg every 2 weeks.  Patient plans to establish care with cardiology per Dr. Gollan this month.  Referral for physical therapy placed to help with strength and conditioning. -     Ambulatory Referral to Physical Therapy  2. Mixed hyperlipidemia (LDL 92 - 05/04/24) Not at goal.  See #1.  3. Essential hypertension with goal blood pressure less than 140/90 Stable.  Discontinue amlodipine.  Placed him back Micardis for kidney and heart protection.  Monitor blood pressure twice a week with goal less than 140/90.  4. Atrial fibrillation, new onset (04/2024) on Eliquis - followed by Dr. Gollan New.  Continue with Eliquis and rate controlling agents.  Continue with amiodarone until he is followed and seen by Dr. Gollan.  -     Ambulatory Referral to Physical Therapy  Occasions and allergies reviewed and reconciled.  Hospital notes, labs, studies reviewed.  Transitional phone call completed within 48 hours of discharge.  Other orders -     REPATHA SURECLICK 140 mg/mL PnIj; Inject 140 mg subcutaneously every 14 (fourteen) days  F/u as scheduled.  Labs prior; cardiology evaluation pending  ALDA CARPEN, MD

## 2024-05-31 ENCOUNTER — Ambulatory Visit: Admitting: Podiatry

## 2024-06-06 NOTE — Progress Notes (Signed)
 [facility] Follow Up Visit   Patient Name: Chase Harris, male   Patient DOB: [birth year] Date of Service: 06/06/2024  Patient MRN: [id number] Provider Creating Note: Woodward Brought, MD  [id number] Primary Care Physician: Alla Amis, MD   [address] [place] [address] Additional Physicians/ Providers:   Impression/Recommendations   Mr. Chase Harris is a 63 y.o. male  with diabetes mellitus type II, diabetic neuropathy, diabetic retinopathy, hypertension, rheumatoid arthritis, gout, hyperlipidemia, GERD, CVA, sleep apnea, fatty liver and history of prostate cancer who presents as a follow up patient with chronic kidney disease stage IIIB. Creatinine 1.78, GFR of 42.   Chronic kidney disease stage IIIA. With nephrotic range proteinuria. Secondary to NSAID induced nephropathy (Celecoxib), diabetic nephropathy and hypertension.   - Continue telmisartan 40mg  daily.  - Continue dapagliflozin - Continue spironolactone  25mg  daily - Continue to avoid nonsteroidal anti-inflammatory agents.    Hypertension with chronic kidney disease:  123/75 - Continue telmisartan, amlodipine, spironolactone  and furosemide  - Discussed low salt diet, DASH diet and Mediterranean diet - Continue home blood pressure monitoring.    Diabetes mellitus type II with chronic kidney disease: insulin  dependent.  Hemoglobin A1c of 7.4% on 05/03/24.  - continue to follow with endocrinology - did not tolerate semaglutide - continue dapagliflozin - currently holding metformin  Hyperlipidemia:  - repatha - fenofibrate    Patient Active Problem List  Diagnosis  . Chronic kidney disease stage 3A (HCC)  . Proteinuria  . Hypertensive chronic kidney disease, benign, with chronic kidney disease stage I through stage IV, or unspecified  . Type 2 diabetes mellitus with diabetic chronic kidney disease (HCC)  . Hyperlipidemia    Orders Placed This Encounter  . PTH, Intact  . Renal  Function Panel  . CBC and Differential  . Urinalysis, Complete w/reflex to Culture  . Protein, Total, Random Urine w/Creatinine (Protein/Creat Ratio)       Return in about 4 weeks (around 07/04/2024).  Chief Complaint   Chief Complaint  Patient presents with  . Follow-up    History of Present Illness   Mr. Chase Harris presents for follow up. Patient presents with his wife who assists with history taking.   Patient was admitted to [facility] from 8/12 to 8/20 for syncope where he was found to have coronary artery disease and underwent CABG. Hospital course complicated by atrial fibrillation. Discharged with a Creatinine of 1.78, GFR of 42.   Patient is currently holding his hydroxychloroquine, metformin, allopurinol and amlodipine. He has been started on amiodarone, apixaban, diltiazem, metoprolol , potassium chloride and evolcumab.   Patient has stopped semaglutide on his own due to nausea.   Patient states he is having more joint pains off his hydroxychloroquine. Denies use of nonsteroidal anti-inflammatory agents.      Medications   Current Outpatient Medications:  .  amiodarone (PACERONE) 200 MG tablet, Take 200 mg by mouth in the morning and 200 mg at noon and 200 mg in the evening., Disp: , Rfl:  .  apixaban (ELIQUIS) 5 MG tablet, Take 5 mg by mouth in the morning and 5 mg in the evening., Disp: , Rfl:  .  dilTIAZem CD (CARDIZEM CD) 120 MG 24 hr capsule, Take 120 mg by mouth 1 (one) time each day, Disp: , Rfl:  .  metoprolol  tartrate (LOPRESSOR ) 50 MG tablet, Take 50 mg by mouth in the morning and 50 mg in the evening., Disp: , Rfl:  .  potassium chloride (KLOR-CON M20) 20 MEQ CR  tablet, Take 20 mEq by mouth in the morning and 20 mEq in the evening., Disp: , Rfl:  .  Repatha SureClick 140 MG/ML solution auto-injector, Inject 140 mg under the skin every 14 days, Disp: , Rfl:  .  Acetaminophen  500 MG capsule, Take 500 mg by mouth if needed for mild pain, Disp: , Rfl:   .  allopurinol (ZYLOPRIM) 300 MG tablet, Take 300 mg by mouth in the morning., Disp: , Rfl:  .  amLODIPine (NORVASC) 10 MG tablet, Take 10 mg by mouth 1 (one) time each day, Disp: , Rfl:  .  aspirin  (ST JOSEPH) 81 MG EC tablet, Take 81 mg by mouth in the morning., Disp: , Rfl:  .  Dapagliflozin Propanediol 10 MG tablet, Take 10 mg by mouth every morning, Disp: , Rfl:  .  Etanercept 50 MG/ML solution auto-injector, Inject 1 mL (50 mg total) subcutaneously once a week, Disp: , Rfl:  .  fenofibrate (TRIGLIDE) 160 MG tablet, Take 160 mg by mouth in the morning., Disp: , Rfl:  .  fluticasone (FLONASE) 50 MCG/ACT nasal spray, PLACE 2 SPRAYS INTO BOTH NOSTRILS ONCE DAILY AS NEEDED FOR RHINITIS, Disp: , Rfl:  .  furosemide  (Lasix ) 20 MG tablet, Take 1 tablet (20 mg total) by mouth 1 (one) time each day, Disp: 30 tablet, Rfl: 11 .  gabapentin  (NEURONTIN ) 300 MG capsule, Take 600 mg by mouth in the morning and 600 mg in the evening and 600 mg before bedtime., Disp: , Rfl:  .  hydroxychloroquine (PLAQUENIL) 200 MG tablet, Take 200 mg by mouth in the morning and 200 mg in the evening., Disp: , Rfl:  .  imipramine (TOFRANIL) 50 MG tablet, Take 50 mg by mouth in the morning., Disp: , Rfl:  .  insulin  aspart (NovoLOG  FLEXPEN) 100 UNIT/ML injection, Inject 45 Units under the skin in the morning and 45 Units at noon and 45 Units in the evening., Disp: , Rfl:  .  loratadine (CLARITIN) 10 MG tablet, Take 10 mg by mouth in the morning., Disp: , Rfl:  .  metFORMIN (GLUCOPHAGE) 1000 MG tablet, Take 1 tablet (1,000 mg total) by mouth 2 (two) times daily with meals, Disp: , Rfl:  .  Microlet Lancets misc, USE AS DIRECTED TWICE A DAY, Disp: , Rfl:  .  omeprazole (PriLOSEC) 40 MG DR capsule, Take 40 mg by mouth in the morning., Disp: , Rfl:  .  spironolactone  (Aldactone ) 25 MG tablet, Take 1 tablet (25 mg total) by mouth 1 (one) time each day, Disp: 30 tablet, Rfl: 11 .  telmisartan (Micardis) 40 MG tablet, Take 1 tablet  (40 mg total) by mouth 1 (one) time each day, Disp: 30 tablet, Rfl: 11 .  traMADol  (ULTRAM ) 50 MG tablet, Take 50 mg by mouth if needed, Disp: , Rfl:    Allergies Doxycycline , Other, Dirithromycin, and Penicillins  History Past Medical History:  Diagnosis Date  . Cerebrovascular accident (HCC)   . Chronic kidney disease stage 3B (HCC) 06/27/2023  . Fatty liver   . Gastroesophageal reflux disease   . Gout   . Hyperlipidemia 06/27/2023  . Hypertensive chronic kidney disease, benign, with chronic kidney disease stage I through stage IV, or unspecified 06/27/2023  . Malignant neoplasm of prostate (HCC)   . Proteinuria 06/27/2023  . Rheumatoid arthritis (HCC)   . Sleep apnea   . Type 2 diabetes mellitus with diabetic chronic kidney disease (HCC) 06/27/2023    Past Surgical History:  Procedure Laterality Date  .  BLEPHAROPLASTY W/ LASER    . FOOT SURGERY Left 2018  . TOTAL KNEE ARTHROPLASTY Left   . UMBILICAL HERNIA REPAIR     Family History  Problem Relation Age of Onset  . Hypertension Father    Social History   Tobacco Use  . Smoking status: Never  . Smokeless tobacco: Never  Substance Use Topics  . Alcohol use: Yes    Comment: rarely     Physical Exam  Vitals BP 123/75 (BP Location: Left upper arm, Patient Position: Sitting)   Pulse 81   Temp 98.4 F   Wt 258 lb (117 kg)   SpO2 98%   BMI 38.10 kg/m   Vitals reviewed. Constitutional: He is oriented to person, place, and time. He appears well-developed.  HEENT:  Head: Normocephalic and atraumatic. Mouth/Throat: Oropharynx is clear and moist.  Eyes: Pupils are equal, round, and reactive to light.  Neck: Neck supple.  Cardiovascular:  Normal rate and regular rhythm.           Pulmonary/Chest: Effort normal and breath sounds normal.  Abdominal: Soft.  Neurological: He is alert and oriented to person, place, and time.  Skin: Skin is warm and dry.     Laboratory Studies  Chemistry  Lab Units 05/22/24 1026  05/15/24 0221 05/14/24 0603 05/13/24 0320 05/12/24 0229 05/11/24 9161 05/11/24 0310 05/10/24 1757 05/10/24 0322 05/04/24 9355 05/03/24 1947 12/29/23 0730 09/25/23 0809 08/15/23 1053 07/25/23 0845 06/27/23 1206 06/27/23 1206 06/20/23 0932 05/05/23 1222 01/24/23 0813 12/27/22 0932 11/17/22 0840 08/23/22 0854 08/08/22 0825 08/08/22 0825  SODIUM mmol/L 137 125* 129* 130* 135 136 135 138 134*   < > 136 138  --  140 140   < > 141  --  138 140 139  --  138  --  138  POTASSIUM mmol/L 4.7 4.0 4.3 4.3 4.8 4.5 5.1* 3.6 3.9   < > 3.8 4.4  --  3.9 4.4   < > 4.8  --  4.5 4.3 4.6  --  4.8  --  4.7  CHLORIDE mmol/L 98 93* 95* 96* 102 107 106 105 100   < > 108 99  --  101 102   < > 103  --  101 102 104  --  100  --  101  CO2 mmol/L 30 29 29 30 29 27  26* 26* 27   < > 25* 33.2*  --  29 27   < > 25  --  26.2 28.7 28.5  --  28.0  --  28.2  ANION GAP  9 3 5 4 4  2* 3 7 7    < > 3 10.2  --   --   --   --   --   --   --   --   --   --   --   --  13.5  MAGNESIUM  mg/dL  --  2.2 2.2 2.4* 2.4*  --  2.6* 3.1* 1.7   < > 1.5*  --   --   --   --   --  1.9  --   --   --   --   --   --   --   --   CALCIUM mg/dL 8.9 8.0* 8.4 8.3* 8.2* 8.1* 8* 8.1* 8.9   < > 8.4 9.3  --  10.0 10.2   < > 10.3  --  9.7 10.2 10.1  --  10.0  --  9.9  PHOSPHORUS mg/dL  --   --   --   --   --   --   --   --   --   --   --   --   --  3.4 3.3  --  4.0  --   --   --   --   --   --   --   --   ALK PHOS U/L  --   --   --   --   --   --   --   --  60  --  62  --   --   --   --   --   --   --  56 57 55  --  61  --   --   PTH pg/mL  --   --   --   --   --   --   --   --   --   --   --   --   --   --  28  --  28  --   --   --   --   --   --   --   --   GLUCOSE mg/dL 809* 827* 837* 835* 820* 117* 144* 106 226*   < > 129* 92  --  151* 176*   < > 90  --  83 125* 158*  --  200*  --  178*  ALBUMIN g/dL  --   --   --   --   --   --   --   --  3.8  --  3.7  --   --  4.2 4.2  --  4.3  4.4  --  4.4 4.4 4.3  --  4.4   < >  --   BUN mg/dL 19 37* 33* 31* 23*  22* 24* 23* 28*   < > 18 25  --  22 23   < > 25  --  32* 36* 31*  --  42*  --  28*  CREATININE mg/dL 8.21* 8.64* 8.84 8.63* 1.2 1.33* 1.32* 1.3* 1.33*   < > 1.15 1.2  --  1.20 1.22   < > 1.29  --  2* 1.4* 1.4*  --  1.4*  --  1.4*  HEMOGLOBIN A1C %  --   --   --   --   --   --   --   --   --   --  7.4* 7.4* 7.6*  --   --   --   --  7.4*  --   --   --  7.7*  --   --  8.4*   < > = values in this interval not displayed.    Iron Studies  Lab Units 11/23/22 1116  IRON ug/dL 47  FERRITIN ng/mL 68  TIBC ug/dL 534.1  IRON SATURATION % 10    CBC  Lab Units 07/25/23 0845 06/27/23 1206  WBC AUTO Thousand/uL 7.1 8.3  HEMOGLOBIN g/dL 85.1 84.7  HEMOGLOBIN URINE  NEGATIVE  --   HEMATOCRIT % 46.6 48.4  MCV fL 84.3 84.9  PLATELETS AUTO Thousand/uL 287 304    Urine  Lab Units 07/25/23 0845 06/27/23 1206 06/27/23 1101 08/08/22 0825  COLOR U  YELLOW  --   --   --   COLOR UA   --   --  Yellow  --   CLARITY UA   --   --  Slightly Cloudy  --   KETONES U MG/DL  NEGATIVE  --   --   --   KETONES UA   --   --  Negative  --  PH UA   --   --  5.5  --   UROBILINOGEN UA   --   --  0.2  --   PROT/CREAT RATIO UR mg/g creat 2.526*  2,526* 1.898*  1,898*  --   --   ALB MG/G CREAT UR ug/mg  --   --   --  122.8*        No lab exists for component: CYCLOSPORITR     Woodward Brought, MD  [address], [place]

## 2024-06-10 ENCOUNTER — Other Ambulatory Visit: Payer: Self-pay | Admitting: *Deleted

## 2024-06-16 ENCOUNTER — Encounter (HOSPITAL_COMMUNITY)
Admission: EM | Disposition: A | Discharge status: Home Health | Payer: Self-pay | Source: Home / Self Care | Attending: Internal Medicine

## 2024-06-16 ENCOUNTER — Other Ambulatory Visit: Payer: Self-pay

## 2024-06-16 ENCOUNTER — Emergency Department (HOSPITAL_COMMUNITY)

## 2024-06-16 ENCOUNTER — Encounter (HOSPITAL_COMMUNITY): Payer: Self-pay

## 2024-06-16 ENCOUNTER — Inpatient Hospital Stay (HOSPITAL_COMMUNITY)

## 2024-06-16 ENCOUNTER — Inpatient Hospital Stay (HOSPITAL_COMMUNITY)
Admission: EM | Admit: 2024-06-16 | Discharge: 2024-06-19 | DRG: 243 | Disposition: A | Discharge status: Home Health | Attending: Internal Medicine | Admitting: Internal Medicine

## 2024-06-16 DIAGNOSIS — Z841 Family history of disorders of kidney and ureter: Secondary | ICD-10-CM

## 2024-06-16 DIAGNOSIS — E669 Obesity, unspecified: Secondary | ICD-10-CM | POA: Diagnosis present

## 2024-06-16 DIAGNOSIS — I129 Hypertensive chronic kidney disease with stage 1 through stage 4 chronic kidney disease, or unspecified chronic kidney disease: Secondary | ICD-10-CM | POA: Diagnosis present

## 2024-06-16 DIAGNOSIS — N1831 Chronic kidney disease, stage 3a: Secondary | ICD-10-CM | POA: Diagnosis present

## 2024-06-16 DIAGNOSIS — I452 Bifascicular block: Secondary | ICD-10-CM | POA: Diagnosis present

## 2024-06-16 DIAGNOSIS — Z8249 Family history of ischemic heart disease and other diseases of the circulatory system: Secondary | ICD-10-CM

## 2024-06-16 DIAGNOSIS — Z96652 Presence of left artificial knee joint: Secondary | ICD-10-CM | POA: Diagnosis present

## 2024-06-16 DIAGNOSIS — N179 Acute kidney failure, unspecified: Secondary | ICD-10-CM | POA: Diagnosis present

## 2024-06-16 DIAGNOSIS — Z7982 Long term (current) use of aspirin: Secondary | ICD-10-CM

## 2024-06-16 DIAGNOSIS — E1151 Type 2 diabetes mellitus with diabetic peripheral angiopathy without gangrene: Secondary | ICD-10-CM | POA: Diagnosis present

## 2024-06-16 DIAGNOSIS — Z880 Allergy status to penicillin: Secondary | ICD-10-CM

## 2024-06-16 DIAGNOSIS — E1142 Type 2 diabetes mellitus with diabetic polyneuropathy: Secondary | ICD-10-CM | POA: Diagnosis present

## 2024-06-16 DIAGNOSIS — Z951 Presence of aortocoronary bypass graft: Secondary | ICD-10-CM

## 2024-06-16 DIAGNOSIS — Z8673 Personal history of transient ischemic attack (TIA), and cerebral infarction without residual deficits: Secondary | ICD-10-CM

## 2024-06-16 DIAGNOSIS — I480 Paroxysmal atrial fibrillation: Secondary | ICD-10-CM | POA: Diagnosis present

## 2024-06-16 DIAGNOSIS — Z79899 Other long term (current) drug therapy: Secondary | ICD-10-CM | POA: Diagnosis not present

## 2024-06-16 DIAGNOSIS — E785 Hyperlipidemia, unspecified: Secondary | ICD-10-CM | POA: Diagnosis present

## 2024-06-16 DIAGNOSIS — Z794 Long term (current) use of insulin: Secondary | ICD-10-CM | POA: Diagnosis not present

## 2024-06-16 DIAGNOSIS — R001 Bradycardia, unspecified: Secondary | ICD-10-CM | POA: Diagnosis present

## 2024-06-16 DIAGNOSIS — E1122 Type 2 diabetes mellitus with diabetic chronic kidney disease: Secondary | ICD-10-CM | POA: Diagnosis present

## 2024-06-16 DIAGNOSIS — I4892 Unspecified atrial flutter: Secondary | ICD-10-CM | POA: Diagnosis present

## 2024-06-16 DIAGNOSIS — Z8546 Personal history of malignant neoplasm of prostate: Secondary | ICD-10-CM

## 2024-06-16 DIAGNOSIS — Z7901 Long term (current) use of anticoagulants: Secondary | ICD-10-CM | POA: Diagnosis not present

## 2024-06-16 DIAGNOSIS — K219 Gastro-esophageal reflux disease without esophagitis: Secondary | ICD-10-CM | POA: Diagnosis present

## 2024-06-16 DIAGNOSIS — Z791 Long term (current) use of non-steroidal anti-inflammatories (NSAID): Secondary | ICD-10-CM

## 2024-06-16 DIAGNOSIS — Z833 Family history of diabetes mellitus: Secondary | ICD-10-CM

## 2024-06-16 DIAGNOSIS — R55 Syncope and collapse: Secondary | ICD-10-CM

## 2024-06-16 DIAGNOSIS — I442 Atrioventricular block, complete: Principal | ICD-10-CM

## 2024-06-16 DIAGNOSIS — I2510 Atherosclerotic heart disease of native coronary artery without angina pectoris: Secondary | ICD-10-CM | POA: Diagnosis present

## 2024-06-16 DIAGNOSIS — Z888 Allergy status to other drugs, medicaments and biological substances status: Secondary | ICD-10-CM

## 2024-06-16 DIAGNOSIS — Z881 Allergy status to other antibiotic agents status: Secondary | ICD-10-CM

## 2024-06-16 DIAGNOSIS — Z7984 Long term (current) use of oral hypoglycemic drugs: Secondary | ICD-10-CM | POA: Diagnosis not present

## 2024-06-16 DIAGNOSIS — Z87891 Personal history of nicotine dependence: Secondary | ICD-10-CM | POA: Diagnosis not present

## 2024-06-16 DIAGNOSIS — M069 Rheumatoid arthritis, unspecified: Secondary | ICD-10-CM | POA: Diagnosis present

## 2024-06-16 DIAGNOSIS — I441 Atrioventricular block, second degree: Secondary | ICD-10-CM | POA: Diagnosis not present

## 2024-06-16 HISTORY — PX: TEMPORARY PACEMAKER: CATH118268

## 2024-06-16 LAB — COMPREHENSIVE METABOLIC PANEL WITH GFR
ALT: 37 U/L (ref 0–44)
AST: 41 U/L (ref 15–41)
Albumin: 3.3 g/dL — ABNORMAL LOW (ref 3.5–5.0)
Alkaline Phosphatase: 61 U/L (ref 38–126)
Anion gap: 12 (ref 5–15)
BUN: 33 mg/dL — ABNORMAL HIGH (ref 8–23)
CO2: 25 mmol/L (ref 22–32)
Calcium: 9 mg/dL (ref 8.9–10.3)
Chloride: 101 mmol/L (ref 98–111)
Creatinine, Ser: 1.87 mg/dL — ABNORMAL HIGH (ref 0.61–1.24)
GFR, Estimated: 40 mL/min — ABNORMAL LOW (ref >60)
Glucose, Bld: 140 mg/dL — ABNORMAL HIGH (ref 70–99)
Potassium: 4.5 mmol/L (ref 3.5–5.1)
Sodium: 138 mmol/L (ref 135–145)
Total Bilirubin: 0.3 mg/dL (ref 0.0–1.2)
Total Protein: 6.9 g/dL (ref 6.5–8.1)

## 2024-06-16 LAB — CBC WITH DIFFERENTIAL/PLATELET
Abs Immature Granulocytes: 0.06 K/uL (ref 0.00–0.07)
Basophils Absolute: 0.2 K/uL — ABNORMAL HIGH (ref 0.0–0.1)
Basophils Relative: 2 %
Eosinophils Absolute: 0.3 K/uL (ref 0.0–0.5)
Eosinophils Relative: 3 %
HCT: 37 % — ABNORMAL LOW (ref 39.0–52.0)
Hemoglobin: 10.8 g/dL — ABNORMAL LOW (ref 13.0–17.0)
Immature Granulocytes: 1 %
Lymphocytes Relative: 22 %
Lymphs Abs: 2.2 K/uL (ref 0.7–4.0)
MCH: 22.4 pg — ABNORMAL LOW (ref 26.0–34.0)
MCHC: 29.2 g/dL — ABNORMAL LOW (ref 30.0–36.0)
MCV: 76.8 fL — ABNORMAL LOW (ref 80.0–100.0)
Monocytes Absolute: 0.9 K/uL (ref 0.1–1.0)
Monocytes Relative: 9 %
Neutro Abs: 6.4 K/uL (ref 1.7–7.7)
Neutrophils Relative %: 63 %
Platelets: 383 K/uL (ref 150–400)
RBC: 4.82 MIL/uL (ref 4.22–5.81)
RDW: 17.5 % — ABNORMAL HIGH (ref 11.5–15.5)
WBC: 10 K/uL (ref 4.0–10.5)
nRBC: 0 % (ref 0.0–0.2)

## 2024-06-16 LAB — TROPONIN I (HIGH SENSITIVITY)
Troponin I (High Sensitivity): 19 ng/L — ABNORMAL HIGH (ref <18)
Troponin I (High Sensitivity): 19 ng/L — ABNORMAL HIGH (ref <18)

## 2024-06-16 LAB — GLUCOSE, CAPILLARY
Glucose-Capillary: 63 mg/dL — ABNORMAL LOW (ref 70–99)
Glucose-Capillary: 122 mg/dL — ABNORMAL HIGH (ref 70–99)
Glucose-Capillary: 73 mg/dL (ref 70–99)

## 2024-06-16 LAB — MRSA NEXT GEN BY PCR, NASAL: MRSA by PCR Next Gen: NOT DETECTED

## 2024-06-16 LAB — MAGNESIUM: Magnesium: 2.1 mg/dL (ref 1.7–2.4)

## 2024-06-16 SURGERY — TEMPORARY PACEMAKER
Anesthesia: LOCAL

## 2024-06-16 MED ORDER — PANTOPRAZOLE SODIUM 40 MG PO TBEC
40.0000 mg | DELAYED_RELEASE_TABLET | Freq: Every day | ORAL | Status: DC
  Administered 2024-06-17 – 2024-06-19 (×3): 40 mg via ORAL
  Filled 2024-06-16 (×3): qty 1

## 2024-06-16 MED ORDER — SODIUM CHLORIDE 0.9 % IV BOLUS
500.0000 mL | Freq: Once | INTRAVENOUS | Status: AC
  Administered 2024-06-16: 500 mL via INTRAVENOUS

## 2024-06-16 MED ORDER — HEPARIN (PORCINE) IN NACL 1000-0.9 UT/500ML-% IV SOLN
INTRAVENOUS | Status: DC | PRN
  Administered 2024-06-16: 500 mL

## 2024-06-16 MED ORDER — LIDOCAINE HCL (PF) 1 % IJ SOLN
INTRAMUSCULAR | Status: DC | PRN
  Administered 2024-06-16: 10 mL

## 2024-06-16 MED ORDER — ORAL CARE MOUTH RINSE: 15.0000 mL | OROMUCOSAL | Status: DC | PRN

## 2024-06-16 MED ORDER — HEPARIN (PORCINE) 25000 UT/250ML-% IV SOLN
2200.0000 [IU]/h | INTRAVENOUS | Status: DC
  Administered 2024-06-16: 1200 [IU]/h via INTRAVENOUS
  Administered 2024-06-17: 1500 [IU]/h via INTRAVENOUS
  Filled 2024-06-16 (×2): qty 250

## 2024-06-16 MED ORDER — INSULIN ASPART 100 UNIT/ML IJ SOLN
0.0000 [IU] | Freq: Three times a day (TID) | INTRAMUSCULAR | Status: DC
  Administered 2024-06-17: 8 [IU] via SUBCUTANEOUS
  Administered 2024-06-17: 3 [IU] via SUBCUTANEOUS
  Administered 2024-06-18: 11 [IU] via SUBCUTANEOUS
  Administered 2024-06-18 – 2024-06-19 (×4): 2 [IU] via SUBCUTANEOUS

## 2024-06-16 MED ORDER — SODIUM CHLORIDE 0.9% FLUSH
3.0000 mL | Freq: Two times a day (BID) | INTRAVENOUS | Status: DC
  Administered 2024-06-16 – 2024-06-19 (×6): 3 mL via INTRAVENOUS

## 2024-06-16 MED ORDER — SODIUM CHLORIDE 0.9 % IV SOLN
INTRAVENOUS | Status: AC | PRN
  Administered 2024-06-16: 10 mL/h via INTRAVENOUS

## 2024-06-16 MED ORDER — ASPIRIN 81 MG PO TBEC
81.0000 mg | DELAYED_RELEASE_TABLET | Freq: Every day | ORAL | Status: DC
Start: 2024-06-17 — End: 2024-06-18
  Administered 2024-06-17: 81 mg via ORAL
  Filled 2024-06-16: qty 1

## 2024-06-16 MED ORDER — SODIUM CHLORIDE 0.9 % IV SOLN: INTRAVENOUS | Status: AC

## 2024-06-16 MED ORDER — LIDOCAINE HCL (PF) 1 % IJ SOLN
INTRAMUSCULAR | Status: AC
  Filled 2024-06-16: qty 30

## 2024-06-16 MED ORDER — SPIRONOLACTONE 25 MG PO TABS
25.0000 mg | ORAL_TABLET | Freq: Every day | ORAL | Status: DC
  Administered 2024-06-17 – 2024-06-19 (×3): 25 mg via ORAL
  Filled 2024-06-16 (×3): qty 1

## 2024-06-16 MED ORDER — CHLORHEXIDINE GLUCONATE CLOTH 2 % EX PADS
6.0000 | MEDICATED_PAD | Freq: Every day | CUTANEOUS | Status: DC
  Administered 2024-06-16 – 2024-06-19 (×4): 6 via TOPICAL

## 2024-06-16 MED ORDER — DOPAMINE-DEXTROSE 3.2-5 MG/ML-% IV SOLN
0.0000 ug/kg/min | INTRAVENOUS | Status: DC
  Administered 2024-06-16: 5 ug/kg/min via INTRAVENOUS
  Filled 2024-06-16: qty 250

## 2024-06-16 SURGICAL SUPPLY — 7 items
KIT MICROPUNCTURE NIT STIFF (SHEATH) IMPLANT
MAT PREVALON FULL STRYKER (MISCELLANEOUS) IMPLANT
PACK CARDIAC CATHETERIZATION (CUSTOM PROCEDURE TRAY) ×2 IMPLANT
SHEATH PINNACLE 6F 10CM (SHEATH) IMPLANT
SHEATH PROBE COVER 6X72 (BAG) IMPLANT
SHIELD CATH-GARD CONTAMINATION (MISCELLANEOUS) IMPLANT
WIRE PACING TEMP ST TIP 5 (CATHETERS) IMPLANT

## 2024-06-16 NOTE — ED Provider Notes (Signed)
 [facility] Provider Note   CSN: [id number] Arrival date & time: 06/16/24  1622     Patient presents with: Shortness of Breath   Chase Harris is a 63 y.o. male.  With a history of hypertension, diabetes, status post CABG 5 weeks ago who presents to the ED for shortness of breath and weakness.  Patient underwent CABG 5 weeks ago in [place]  and has not yet followed up with his: Cardiologist.  This morning shortness of breath dizziness that he has experienced since the CABG have worsened.  EMS noted him to be bradycardic.  Per EMS patient was initially in sinus bradycardia with progression to type II second-degree heart block and then third-degree heart block.  Normotensive.  No chest pain nausea vomiting fevers chills recent illness.    Shortness of Breath      Prior to Admission medications   Medication Sig Start Date End Date Taking? Authorizing Provider  amiodarone (PACERONE) 200 MG tablet Take 200 mg by mouth daily. 05/15/24 07/12/24 Yes [provider]  aspirin  EC 81 MG tablet Take 81 mg by mouth daily. 05/16/24 07/15/24 Yes [provider]  spironolactone  (ALDACTONE ) 25 MG tablet Take 25 mg by mouth daily. 11/01/23  Yes [provider]  allopurinol (ZYLOPRIM) 300 MG tablet Take 300 mg by mouth daily. 03/03/21   [provider]  celecoxib (CELEBREX) 100 MG capsule Take 100 mg by mouth 2 (two) times daily. 03/16/21   [provider]  colchicine  0.6 MG tablet Take 1 tablet (0.6 mg total) by mouth daily. 11/09/20   Gershon Donnice SAUNDERS, DPM  diltiazem (CARDIZEM CD) 120 MG 24 hr capsule Take 120 mg by mouth daily.    [provider]  ELIQUIS 5 MG TABS tablet Take 5 mg by mouth 2 (two) times daily.    [provider]  etanercept (ENBREL) 50 MG/ML injection Inject 50 mg into the skin every Wednesday. 03/22/21 01/27/23  [provider]  ezetimibe (ZETIA) 10 MG tablet Take 10  mg by mouth daily. 11/25/20   [provider]  FARXIGA 10 MG TABS tablet Take 10 mg by mouth daily. 03/27/19   [provider]  fenofibrate 160 MG tablet Take 160 mg by mouth daily.    [provider]  fluticasone (FLONASE) 50 MCG/ACT nasal spray Place 2 sprays into the nose daily as needed for allergies or rhinitis.     [provider]  furosemide  (LASIX ) 40 MG tablet Take 40 mg by mouth daily.    [provider]  gabapentin  (NEURONTIN ) 600 MG tablet TAKE ONE TABLET (600 MG TOTAL) BY MOUTH THREE TIMES DAILY. 04/01/24   Gershon Donnice SAUNDERS, DPM  HUMALOG KWIKPEN 100 UNIT/ML KwikPen Inject 50 Units into the skin with breakfast, with lunch, and with evening meal. 03/15/19   [provider]  hydrochlorothiazide (HYDRODIURIL) 25 MG tablet Take 25 mg by mouth daily.     [provider]  hydroxychloroquine (PLAQUENIL) 200 MG tablet Take 200 mg by mouth 2 (two) times daily. 12/01/20   [provider]  insulin  degludec (TRESIBA FLEXTOUCH) 100 UNIT/ML FlexTouch Pen Inject 140 Units into the skin at bedtime.    [provider]  lisinopril  (PRINIVIL ,ZESTRIL ) 40 MG tablet Take 40 mg by mouth daily.     [provider]  loratadine (CLARITIN) 10 MG tablet Take 10 mg by mouth daily.     [provider]  metFORMIN (GLUCOPHAGE) 1000 MG tablet Take  1,000 mg by mouth 2 (two) times a day.     [provider]  methocarbamol  (ROBAXIN ) 500 MG tablet Take 1 tablet (500 mg total) by mouth every 8 (eight) hours as needed for muscle spasms. 02/10/23   Kay Kemps, MD  metoprolol  tartrate (LOPRESSOR ) 50 MG tablet Take 50 mg by mouth 2 (two) times daily.    [provider]  Microlet Lancets MISC  06/12/19   [provider]  Multiple Vitamin (MULTIVITAMIN WITH MINERALS) TABS tablet Take 1 tablet by mouth daily.    [provider]  omeprazole (PRILOSEC) 20 MG capsule Take 20 mg by mouth daily.    [provider]  predniSONE (DELTASONE) 5 MG tablet Take 5 mg by mouth daily.    [provider]  Semaglutide,0.25 or 0.5MG /DOS, 2 MG/1.5ML SOPN Inject 0.25 mg into the skin every Thursday. 05/21/20   [provider]    Allergies: Doxycycline , Penicillins, and Dynabac [dirithromycin]    Review of Systems  Respiratory:  Positive for shortness of breath.     Updated Vital Signs BP (!) 155/86   Pulse (!) 42   Temp 98.1 F (36.7 C) (Oral)   Resp 16   Ht 5' 9 (1.753 m)   Wt 114.8 kg   SpO2 98%   BMI 37.36 kg/m   Physical Exam Vitals and nursing note reviewed.  HENT:     Head: Normocephalic and atraumatic.  Eyes:     Pupils: Pupils are equal, round, and reactive to light.  Cardiovascular:     Rate and Rhythm: Bradycardia present.  Pulmonary:     Effort: Pulmonary effort is normal.     Breath sounds: Normal breath sounds.  Chest:     Comments: Healing midline sternotomy scar Abdominal:     Palpations: Abdomen is soft.     Tenderness: There is no abdominal tenderness.  Skin:    General: Skin is warm and dry.  Neurological:     Mental Status: He is alert.  Psychiatric:        Mood and Affect: Mood normal.     (all labs ordered are listed, but only abnormal results are displayed) Labs Reviewed  COMPREHENSIVE METABOLIC PANEL WITH GFR - Abnormal; Notable for the following components:      Result Value   Glucose, Bld 140 (*)    BUN 33 (*)    Creatinine, Ser 1.87 (*)    Albumin 3.3 (*)    GFR, Estimated 40 (*)    All other components within normal limits  CBC WITH DIFFERENTIAL/PLATELET - Abnormal; Notable for the following components:   Hemoglobin 10.8 (*)    HCT 37.0 (*)    MCV 76.8 (*)    MCH 22.4 (*)    MCHC 29.2 (*)    RDW 17.5 (*)    Basophils Absolute 0.2 (*)    All other components within normal limits  TROPONIN I (HIGH SENSITIVITY) - Abnormal; Notable for the following components:   Troponin I (High Sensitivity) 19 (*)    All other  components within normal limits  TROPONIN I (HIGH SENSITIVITY) - Abnormal; Notable for the following components:   Troponin I (High Sensitivity) 19 (*)    All other components within normal limits  MAGNESIUM     EKG: EKG Interpretation Date/Time:  Sunday June 16 2024 16:39:58 EDT Ventricular Rate:  110 PR Interval:    QRS Duration:  110 QT Interval:  290 QTC Calculation: 365 R Axis:   153  Text  Interpretation: Third degree heart block Paired ventricular premature complexes Low voltage, precordial leads Probable right ventricular hypertrophy Nonspecific T abnormalities, diffuse leads Confirmed by Pamella Sharper [id number]) on 06/16/2024 8:59:04 PM  Radiology: [facility] Chest Portable 1 View Result Date: 06/16/2024 CLINICAL DATA:  Status post CABG. EXAM: PORTABLE CHEST 1 VIEW COMPARISON:  Chest x-ray 03/31/2019 FINDINGS: Patient is status post recent cardiac surgery. The cardiomediastinal silhouette is mildly enlarged. There central pulmonary vascular congestion. There is no focal lung infiltrate, pleural effusion or pneumothorax. New sternotomy wires are present. IMPRESSION: Mild cardiomegaly with central pulmonary vascular congestion. Electronically Signed   By: Greig Pique M.D.   On: 06/16/2024 17:08     .Critical Care  Performed by: Pamella Sharper LABOR, DO Authorized by: Pamella Sharper LABOR, DO   Critical care provider statement:    Critical care time (minutes):  45   Critical care was necessary to treat or prevent imminent or life-threatening deterioration of the following conditions:  Cardiac failure   Critical care was time spent personally by me on the following activities:  Development of treatment plan with patient or surrogate, discussions with consultants, evaluation of patient's response to treatment, examination of patient, ordering and review of laboratory studies, ordering and review of radiographic studies, ordering and performing treatments and interventions, pulse oximetry,  re-evaluation of patient's condition and review of old charts    Medications Ordered in the ED  DOPamine  (INTROPIN ) 800 mg in dextrose  5 % 250 mL (3.2 mg/mL) infusion (0 mcg/kg/min  114.8 kg Intravenous Stopped 06/16/24 1853)  Heparin  (Porcine) in NaCl 1000-0.9 UT/500ML-% SOLN (500 mLs  Given 06/16/24 2032)  lidocaine  (PF) (XYLOCAINE ) 1 % injection (10 mLs Infiltration Given 06/16/24 2056)  sodium chloride  0.9 % bolus 500 mL (0 mLs Intravenous Stopped 06/16/24 1924)    Clinical Course as of 06/16/24 2059  Sun Jun 16, 2024  1817 Discussed case with Dr.Salah (cardiology) who will evaluate patient in the ED.  Patient will likely require pacemaker patient here either tonight or tomorrow [MP]  1830 Dr Otelia recommends starting dopamine  drip at 5 and monitoring the effects of his heart block. [MP]  1852 Dopamine  has been running at 5 for about 15 minutes however patient felt much worse he began to feel nauseous.  Slightly decreased blood pressure while on dopamine .  We stopped this infusion and his blood pressure improved as did his nausea.  I did make Dr. Otelia aware who agrees with plan to stop the dopamine  [MP]  1910 Patient now back in sinus bradycardia with rate in 40s.  Remains hemodynamically stable. [MP]  2030 Discussed case again with Dr. Mady (cardiology) and Dr. Otelia.  Patient will be taken to Cath Lab for transvenous pacer placement tonight with plan for later permanent pacemaker placement.  He will be admitted to cardiology on CCU service directly thereafter.  Blood pressure has remained stable here [MP]    Clinical Course User Index [MP] Pamella Sharper LABOR, DO                                 Medical Decision Making 63 year old male with history as above presenting 5 weeks after CABG with concern for third-degree heart block.  Short of breath and dizzy.  Blood pressure stable but looks like he is oscillating between sinus bradycardia and third-degree heart block.  Will obtain cardiac workup  including EKG, labs, chest x-ray and continue to monitor closely on telemetry.  Defibrillation pads are in place in the event we need to convert to transcutaneous pacing.  Will page cardiology for likely permanent pacemaker placement  Amount and/or Complexity of Data Reviewed Labs: ordered. Radiology: ordered.  Risk Prescription drug management. Decision regarding hospitalization.        Final diagnoses:  Third degree heart block [facility])  Status post coronary artery bypass graft    ED Discharge Orders     None          Pamella Ozell LABOR, DO 06/16/24 2059

## 2024-06-16 NOTE — ED Notes (Signed)
 Pt placed on Zoll pads and pacing wires.

## 2024-06-16 NOTE — H&P (Addendum)
 Cardiology Admission History and Physical   Patient ID: Chase Harris MRN: [id number]; DOB: [birth year]   Admission date: 06/16/2024  PCP:  Alla Amis, MD   [facility] Providers Cardiologist:  None       Chief Complaint:  near syncope  Patient Profile: Chase Harris is a 63 y.o. male with CAD s/p 4v CABG with LIMA to diagonal, SVG to LAD, SVG to PDA, and left radial to RPL on 05/10/2024, post-CABG AF on amiodarone and apixaban, HTN, T2DM, HLD, CKD, OSA on CPAP, RA, TIA, and CVA in 2018  who is being seen 06/16/2024 for the evaluation of near-syncope.  History of Present Illness: Chase Harris reports that he was in his usual state of health until this morning when he staretd to have lightheadedness and fatigue in addition to episodes of pre-syncope with exertion. No syncope. He reports no chest pain but endorses some SOB that also started today. No prior similar episodes.    In the ED, he was found to be in intermittent third degree AVB. His rhythm when not in CHB is sinus bradycardia with RBBB and LPFB. He was hemodynamically stable with MAPs in the 65-70. He reports being still symptomatic while resting but his symptoms get significantly worse with exertion.   Review of his medications list shows that he's currently on amiodarone 200 mg daily, diltiazem 120 mg daily, and metoprolol  tartrate 50 mg BID.   A trial of dopamine  gtt was initiated in the ED at 5 mcg/kg/min but patient's BP dropped 81/57 and he started to feel worse; and therefore, it was subsequently stopped.   Past Medical History:  Diagnosis Date   Arthritis    Chronic kidney disease    Constipation    Diabetes mellitus without complication (HCC)    Metformin   Diabetic peripheral neuropathy (HCC)    GERD (gastroesophageal reflux disease)    History of prostate cancer    Hypertension    Peripheral vascular disease (HCC)    Prostate cancer (HCC)    Sleep apnea    has cpap   Stroke  [facility])    mini stroke 2016 - no deficitis   Past Surgical History:  Procedure Laterality Date   COLONOSCOPY WITH PROPOFOL  N/A 06/27/2018   Procedure: COLONOSCOPY WITH PROPOFOL ;  Surgeon: Viktoria Lamar DASEN, MD;  Location: [facility] ENDOSCOPY;  Service: Endoscopy;  Laterality: N/A;   CORONARY ARTERY BYPASS GRAFT     ESOPHAGOGASTRODUODENOSCOPY (EGD) WITH PROPOFOL  N/A 06/27/2018   Procedure: ESOPHAGOGASTRODUODENOSCOPY (EGD) WITH PROPOFOL ;  Surgeon: Viktoria Lamar DASEN, MD;  Location: [facility] ENDOSCOPY;  Service: Endoscopy;  Laterality: N/A;   HERNIA REPAIR     PROSTATECTOMY     TAYLOR BUNIONECTOMY     TOTAL KNEE ARTHROPLASTY Left 02/10/2023   Procedure: TOTAL KNEE ARTHROPLASTY;  Surgeon: Kay Kemps, MD;  Location: [facility] ORS;  Service: Orthopedics;  Laterality: Left;  choice with interscalene block   WISDOM TOOTH EXTRACTION       Medications Prior to Admission: Prior to Admission medications   Medication Sig Start Date End Date Taking? Authorizing Provider  amiodarone (PACERONE) 200 MG tablet Take 200 mg by mouth daily. 05/15/24 07/12/24 Yes [provider]  aspirin  EC 81 MG tablet Take 81 mg by mouth daily. 05/16/24 07/15/24 Yes [provider]  spironolactone  (ALDACTONE ) 25 MG tablet Take 25 mg by mouth daily. 11/01/23  Yes [provider]  allopurinol (ZYLOPRIM) 300 MG tablet Take 300 mg by mouth daily. 03/03/21   [provider]  celecoxib (CELEBREX) 100 MG capsule Take 100 mg by mouth 2 (two) times daily. 03/16/21   [provider]  colchicine  0.6 MG tablet Take 1 tablet (0.6 mg total) by mouth daily. 11/09/20   Gershon Donnice SAUNDERS, DPM  diltiazem (CARDIZEM CD) 120 MG 24 hr capsule Take 120 mg by mouth daily.    [provider]  ELIQUIS 5 MG TABS tablet Take 5 mg by mouth 2 (two) times daily.    [provider]  etanercept (ENBREL) 50 MG/ML injection Inject 50 mg into the skin every Wednesday. 03/22/21 01/27/23  [provider]  ezetimibe  (ZETIA) 10 MG tablet Take 10 mg by mouth daily. 11/25/20   [provider]  FARXIGA 10 MG TABS tablet Take 10 mg by mouth daily. 03/27/19   [provider]  fenofibrate 160 MG tablet Take 160 mg by mouth daily.    [provider]  fluticasone (FLONASE) 50 MCG/ACT nasal spray Place 2 sprays into the nose daily as needed for allergies or rhinitis.     [provider]  furosemide  (LASIX ) 40 MG tablet Take 40 mg by mouth daily.    [provider]  gabapentin  (NEURONTIN ) 600 MG tablet TAKE ONE TABLET (600 MG TOTAL) BY MOUTH THREE TIMES DAILY. 04/01/24   Gershon Donnice SAUNDERS, DPM  HUMALOG KWIKPEN 100 UNIT/ML KwikPen Inject 50 Units into the skin with breakfast, with lunch, and with evening meal. 03/15/19   [provider]  hydrochlorothiazide (HYDRODIURIL) 25 MG tablet Take 25 mg by mouth daily.     [provider]  hydroxychloroquine (PLAQUENIL) 200 MG tablet Take 200 mg by mouth 2 (two) times daily. 12/01/20   [provider]  insulin  degludec (TRESIBA FLEXTOUCH) 100 UNIT/ML FlexTouch Pen Inject 140 Units into the skin at bedtime.    [provider]  lisinopril  (PRINIVIL ,ZESTRIL ) 40 MG tablet Take 40 mg by mouth daily.     [provider]  loratadine (CLARITIN) 10 MG tablet Take 10 mg by mouth daily.     [provider]  metFORMIN (GLUCOPHAGE) 1000 MG tablet Take 1,000 mg by mouth 2 (two) times a day.     [provider]  methocarbamol  (ROBAXIN ) 500 MG tablet Take 1 tablet (500 mg total) by mouth every 8 (eight) hours as needed for muscle spasms. 02/10/23   Kay Kemps, MD  metoprolol  tartrate (LOPRESSOR ) 50 MG tablet Take 50 mg by mouth 2 (two) times daily.    [provider]  Microlet Lancets MISC  06/12/19   [provider]  Multiple Vitamin (MULTIVITAMIN WITH MINERALS) TABS tablet Take 1 tablet by mouth daily.    [provider]  omeprazole (PRILOSEC) 20 MG capsule Take 20 mg  by mouth daily.    [provider]  predniSONE (DELTASONE) 5 MG tablet Take 5 mg by mouth daily.    [provider]  Semaglutide,0.25 or 0.5MG /DOS, 2 MG/1.5ML SOPN Inject 0.25 mg into the skin every Thursday. 05/21/20   [provider]     Allergies:    Allergies  Allergen Reactions   Doxycycline  Hives    Unknown - pt informed pharmacist 02/17/2017. Previously reported diarrhea, reported by pt. 01/11/21 reaction of Hives.    Penicillins Rash    At birth   Dynabac [Dirithromycin] Nausea And Vomiting    Other reaction(s): Abdominal Pain    Social History:   Social History   Socioeconomic History   Marital status: Married    Spouse name: Not on  file   Number of children: Not on file   Years of education: Not on file   Highest education level: Not on file  Occupational History   Not on file  Tobacco Use   Smoking status: Former   Smokeless tobacco: Never  Vaping Use   Vaping status: Never Used  Substance and Sexual Activity   Alcohol use: Yes    Alcohol/week: 4.0 standard drinks of alcohol    Types: 2 Cans of beer, 2 Shots of liquor per week    Comment: on occas   Drug use: No   Sexual activity: Yes  Other Topics Concern   Not on file  Social History Narrative   Not on file   Social Drivers of Health   Financial Resource Strain: Low Risk (05/08/2024)   Received from [facility]   Overall Financial Resource Strain (CARDIA)    Difficulty of Paying Living Expenses: Not hard at all  Food Insecurity: No Food Insecurity (05/08/2024)   Received from [facility]   Hunger Vital Sign    Within the past 12 months, you worried that your food would run out before you got the money to buy more.: Never true    Within the past 12 months, the food you bought just didn't last and you didn't have money to get more.: Never true  Transportation Needs: No Transportation Needs (05/08/2024)   Received from [facility] - Transportation    Lack of  Transportation (Medical): No    Lack of Transportation (Non-Medical): No  Physical Activity: Insufficiently Active (05/08/2024)   Received from [facility]   Exercise Vital Sign    On average, how many days per week do you engage in moderate to strenuous exercise (like a brisk walk)?: 3 days    On average, how many minutes do you engage in exercise at this level?: 30 min  Stress: Patient Declined (05/08/2024)   Received from [facility] - Occupational Stress Questionnaire    Feeling of Stress : Patient declined  Social Connections: Moderately Integrated (05/08/2024)   Received from [facility]   Social Connection and Isolation Panel    In a typical week, how many times do you talk on the phone with family, friends, or neighbors?: More than three times a week    How often do you get together with friends or relatives?: More than three times a week    How often do you attend church or religious services?: More than 4 times per year    Do you belong to any clubs or organizations such as church groups, unions, fraternal or athletic groups, or school groups?: No    How often do you attend meetings of the clubs or organizations you belong to?: Never    Are you married, widowed, divorced, separated, never married, or living with a partner?: Married  Intimate Partner Violence: Not At Risk (05/08/2024)   Received from [facility]   Humiliation, Afraid, Rape, and Kick questionnaire    Within the last year, have you been afraid of your partner or ex-partner?: No    Within the last year, have you been humiliated or emotionally abused in other ways by your partner or ex-partner?: No    Within the last year, have you been kicked, hit, slapped, or otherwise physically hurt by your partner or ex-partner?: No    Within the last year, have you been raped or forced to have any kind of  sexual activity by your partner or ex-partner?: No     Family History:    The patient's family history includes Congestive Heart Failure in his father; Diabetes in his paternal aunt and paternal grandmother; Hypertension in his father; Hypothyroidism in his mother; Kidney disease in his father.    ROS:  Please see the history of present illness.  All other ROS reviewed and negative.     Physical Exam/Data: Vitals:   06/16/24 1857 06/16/24 1915 06/16/24 1930 06/16/24 2034  BP: (!) 103/44 (!) 134/47 (!) 155/86   Pulse: (!) 35 (!) 42 (!) 42   Resp: (!) 26 (!) 22 16   Temp:      TempSrc:      SpO2: 92% 95% 97% 98%  Weight:      Height:        Intake/Output Summary (Last 24 hours) at 06/16/2024 2038 Last data filed at 06/16/2024 1924 Gross per 24 hour  Intake 500 ml  Output --  Net 500 ml      06/16/2024    4:43 PM 02/10/2023    5:46 AM 01/31/2023   11:34 AM  Last 3 Weights  Weight (lbs) 253 lb 254 lb 254 lb  Weight (kg) 114.76 kg 115.214 kg 115.214 kg     Body mass index is 37.36 kg/m.  General:  Well nourished, well developed, in no acute distress HEENT: normal Neck: no JVD Vascular: No carotid bruits; Distal pulses 2+ bilaterally Cardiac:  normal S1, S2; RRR; no murmur  Lungs:  clear to auscultation bilaterally, no wheezing, rhonchi or rales  Abd: soft, nontender Ext: no edema Musculoskeletal:  No deformities, BUE and BLE strength normal and equal Skin: warm and dry  Neuro:  CNs 2-12 intact, no focal abnormalities noted Psych:  Normal affect     EKG:  The ECG that was done 06/16/2024 was personally reviewed and demonstrates sinus rhythm with complete heart block  Relevant CV Studies:  TTE 07/2023:  1. Left ventricular ejection fraction, by estimation, is 60 to 65%. The  left ventricle has normal function. The left ventricle has no regional  wall motion abnormalities. There is moderate left ventricular hypertrophy.  Left ventricular diastolic  parameters are consistent with Grade I diastolic dysfunction (impaired  relaxation).   2.  Right ventricular systolic function is normal. The right ventricular  size is normal.   3. The mitral valve is normal in structure. No evidence of mitral valve  regurgitation. No evidence of mitral stenosis.   4. The aortic valve has an indeterminant number of cusps. Aortic valve  regurgitation is not visualized. No aortic stenosis is present.   5. The inferior vena cava is normal in size with greater than 50%  respiratory variability, suggesting right atrial pressure of 3 mmHg.     Laboratory Data: High Sensitivity Troponin:   Recent Labs  Lab 06/16/24 1646  TROPONINIHS 19*      Chemistry Recent Labs  Lab 06/16/24 1646  NA 138  K 4.5  CL 101  CO2 25  GLUCOSE 140*  BUN 33*  CREATININE 1.87*  CALCIUM 9.0  MG 2.1  GFRNONAA 40*  ANIONGAP 12    Recent Labs  Lab 06/16/24 1646  PROT 6.9  ALBUMIN 3.3*  AST 41  ALT 37  ALKPHOS 61  BILITOT 0.3   Lipids No results for input(s): CHOL, TRIG, HDL, LABVLDL, LDLCALC, CHOLHDL in the last 168 hours. Hematology Recent Labs  Lab 06/16/24 1646  WBC 10.0  RBC 4.82  HGB 10.8*  HCT 37.0*  MCV 76.8*  MCH 22.4*  MCHC 29.2*  RDW 17.5*  PLT 383   Thyroid No results for input(s): TSH, FREET4 in the last 168 hours. BNPNo results for input(s): BNP, PROBNP in the last 168 hours.  DDimer No results for input(s): DDIMER in the last 168 hours.  Radiology/Studies:  DG Chest Portable 1 View Result Date: 06/16/2024 CLINICAL DATA:  Status post CABG. EXAM: PORTABLE CHEST 1 VIEW COMPARISON:  Chest x-ray 03/31/2019 FINDINGS: Patient is status post recent cardiac surgery. The cardiomediastinal silhouette is mildly enlarged. There central pulmonary vascular congestion. There is no focal lung infiltrate, pleural effusion or pneumothorax. New sternotomy wires are present. IMPRESSION: Mild cardiomegaly with central pulmonary vascular congestion. Electronically Signed   By: Greig Pique M.D.   On: 06/16/2024 17:08      Assessment and Plan: CHB Symptomatic at rest and with activity. Hemodynamically stable. No true angina symptoms although he describes SOB that started today along with his pre-syncope episodes. Troponin 19. He's currently on amiodarone, diltiazem, and metoprolol . - Given that he's symptomatic at rest, we discussed TVP and patient was agreeable. To cath lab for TVP placement.  - Stop amiodarone, diltiazem, and metoprolol ; will wait for 48 hours to see if his AV conduction improves with washout of these medications.  - EP consult in the AM for potential need for PPM.   2. CAD s/p 4v CABG CABG was done on 05/10/2024 after a diagnosis of multivessel disease on LHC that was done in the setting of syncopal episodes. Query if he was already having intermittent high grade AV block at that time.  - Continue ASA 81 mg daily  - Not on statin therapy given myalgia. On Repatha   3. Post CABG AF - Hold apixaban for now (last dose the AM of 9/21); heparin  gtt after TVP - Hold amiodarone and AV nodal blocking agents as above   4. T2DM - SSI   Code Status: Full Code  Severity of Illness: The appropriate patient status for this patient is INPATIENT. Inpatient status is judged to be reasonable and necessary in order to provide the required intensity of service to ensure the patient's safety. The patient's presenting symptoms, physical exam findings, and initial radiographic and laboratory data in the context of their chronic comorbidities is felt to place them at high risk for further clinical deterioration. Furthermore, it is not anticipated that the patient will be medically stable for discharge from the hospital within 2 midnights of admission.   * I certify that at the point of admission it is my clinical judgment that the patient will require inpatient hospital care spanning beyond 2 midnights from the point of admission due to high intensity of service, high risk for further deterioration and  high frequency of surveillance required.*  For questions or updates, please contact [facility] Please consult [email or web address] for contact info under       Signed, Gillian CHRISTELLA Cass, MD  06/16/2024 8:38 PM

## 2024-06-16 NOTE — Progress Notes (Signed)
 PHARMACY - ANTICOAGULATION CONSULT NOTE  Pharmacy Consult for Heparin   Indication: atrial fibrillation  Allergies  Allergen Reactions   Doxycycline  Hives    Unknown - pt informed pharmacist 02/17/2017. Previously reported diarrhea, reported by pt. 01/11/21 reaction of Hives.    Penicillins Rash    At birth   Dynabac [Dirithromycin] Nausea And Vomiting    Other reaction(s): Abdominal Pain    Patient Measurements: Height: 5' 9 (175.3 cm) Weight: 114.8 kg (253 lb) IBW/kg (Calculated) : 70.7 HEPARIN  DW (KG): 96.3  Vital Signs: Temp: 98.1 F (36.7 C) (09/21 1640) Temp Source: Oral (09/21 1640) BP: 164/52 (09/21 2119) Pulse Rate: 0 (09/21 2119)  Labs: Recent Labs    06/16/24 1646 06/16/24 1925  HGB 10.8*  --   HCT 37.0*  --   PLT 383  --   CREATININE 1.87*  --   TROPONINIHS 19* 19*    Estimated Creatinine Clearance: 49.8 mL/min (A) (by C-G formula based on SCr of 1.87 mg/dL (H)).   Medical History: Past Medical History:  Diagnosis Date   Arthritis    Chronic kidney disease    Constipation    Diabetes mellitus without complication (HCC)    Metformin   Diabetic peripheral neuropathy (HCC)    GERD (gastroesophageal reflux disease)    History of prostate cancer    Hypertension    Peripheral vascular disease (HCC)    Prostate cancer (HCC)    Sleep apnea    has cpap   Stroke [facility])    mini stroke 2016 - no deficitis      Assessment: 63yom with DM, RA, recently diagnosed with CAD > CABG 8/25 with post op Afib on amiodarone, diltiazem, metoprolol  EF 50% and apixaban last dose 9/21 morning. Presented with bradycardia and heart block take to cath lab for TVP. Will hold apixaban for now pending upcoming procedure  Will bridge with heparin  drip.  Cbc stable. Will dose heparin  with aptt as apixaban falsely elevates heparin  level.   Goal of Therapy:  Heparin  level 0.3-0.7 units/ml aPTT 66-103 seconds Monitor platelets by anticoagulation protocol: Yes   Plan:   Heparin  drip 1200 uts/hr  Aptt in 6hr  Daily heparin  level, aptt and cbc  Monitor s/s bleeding  F/u procedure plans and resume apixaban when complete   Olam Chalk Pharm.D. CPP, BCPS Clinical Pharmacist [phone] 06/16/2024 10:16 PM

## 2024-06-16 NOTE — ED Triage Notes (Signed)
 Pt BIB [facility] from home for shob and dizziness post CABG 5 wks ago. This AM sx worsened greatly. HR 30s-40s per EMS, pt usually in 80s at rest. Pt progressed from 1st to 2nd degree HB with EMS. Pt denies CP, abd pain, N/V. BP 110/58 per EMS, all other VSS.

## 2024-06-16 NOTE — Progress Notes (Signed)
 [facility] Interventional Cardiology Note  06/16/24 8:31 PM  Patient presented with near-syncope that began this morning, found to have marked bradycardia with frequent episodes of 2:1 AV block and complete heart block.  He is s/p CABG in [place] in 04/2024, complicated by post-op a-fib.  He is on apixaban, metoprolol , and diltiazem.  In the ED, he reports continued dizziness and nausea.  Dopamine  infusion did not significantly improve his heart rate and actually lowered BP with worsening nausea.  Exam notable for bradycardic but regular rhythm w/o murmurs.  Lungs clear anteriorly.  No LE edema noted.  Labs notable for K 4.5, BUN 33, creatinine 1.9, troponin 19, WBC 10.0, HGB 10.8, and PLT 383.  EKG shows sinus rhythm with 2:1 AV block, RBBB, and LPFB.  Treatment options consisting of close monitoring in ICU to allow for washout of BB/CCB versus temporary transvenous pacemaker placement were discussed with the patient.  Given continued marked bradycardia (currently in complete heart block), with ongoing symptoms and lack of response to dopamine , we have agreed to proceed with temporary transvenous pacemaker placement.  Informed consent after discussing risks and benefits with the patient was provided.  Lonni Hanson, MD [facility]

## 2024-06-16 NOTE — ED Notes (Signed)
 Pt EKG appears sinus brady. Repeat EKG obtained and handed to EDP.

## 2024-06-16 NOTE — H&P (View-Only) (Signed)
 [facility] Interventional Cardiology Note  06/16/24 8:31 PM  Patient presented with near-syncope that began this morning, found to have marked bradycardia with frequent episodes of 2:1 AV block and complete heart block.  He is s/p CABG in [place] in 04/2024, complicated by post-op a-fib.  He is on apixaban, metoprolol , and diltiazem.  In the ED, he reports continued dizziness and nausea.  Dopamine  infusion did not significantly improve his heart rate and actually lowered BP with worsening nausea.  Exam notable for bradycardic but regular rhythm w/o murmurs.  Lungs clear anteriorly.  No LE edema noted.  Labs notable for K 4.5, BUN 33, creatinine 1.9, troponin 19, WBC 10.0, HGB 10.8, and PLT 383.  EKG shows sinus rhythm with 2:1 AV block, RBBB, and LPFB.  Treatment options consisting of close monitoring in ICU to allow for washout of BB/CCB versus temporary transvenous pacemaker placement were discussed with the patient.  Given continued marked bradycardia (currently in complete heart block), with ongoing symptoms and lack of response to dopamine , we have agreed to proceed with temporary transvenous pacemaker placement.  Informed consent after discussing risks and benefits with the patient was provided.  Lonni Hanson, MD [facility]

## 2024-06-16 NOTE — Interval H&P Note (Signed)
 History and Physical Interval Note:  06/16/2024 8:49 PM  Chase Harris  has presented today for surgery, with the diagnosis of complete heart block.  The various methods of treatment have been discussed with the patient and family. After consideration of risks, benefits and other options for treatment, the patient has consented to  Procedure(s): TEMPORARY PACEMAKER (N/A) as a surgical intervention.  The patient's history has been reviewed, patient examined, no change in status, stable for surgery.  I have reviewed the patient's chart and labs.  Questions were answered to the patient's satisfaction.     Sharnice Bosler

## 2024-06-16 NOTE — ED Notes (Signed)
 Pt found to be in 3rd degree HB in triage. EDP at bedside.

## 2024-06-16 NOTE — ED Notes (Signed)
 Pt reports feeling nauseated and dizzy. Dopamine  drip stopped and EDP notified. EDP at bedside.

## 2024-06-17 ENCOUNTER — Encounter (HOSPITAL_COMMUNITY): Payer: Self-pay | Admitting: Internal Medicine

## 2024-06-17 ENCOUNTER — Inpatient Hospital Stay (HOSPITAL_COMMUNITY)

## 2024-06-17 DIAGNOSIS — R55 Syncope and collapse: Secondary | ICD-10-CM

## 2024-06-17 DIAGNOSIS — N179 Acute kidney failure, unspecified: Secondary | ICD-10-CM

## 2024-06-17 DIAGNOSIS — I442 Atrioventricular block, complete: Secondary | ICD-10-CM

## 2024-06-17 DIAGNOSIS — N1831 Chronic kidney disease, stage 3a: Secondary | ICD-10-CM

## 2024-06-17 DIAGNOSIS — I2510 Atherosclerotic heart disease of native coronary artery without angina pectoris: Secondary | ICD-10-CM

## 2024-06-17 LAB — ECHOCARDIOGRAM COMPLETE
Calc EF: 75.7 %
Height: 69 in
S' Lateral: 2.72 cm
Single Plane A2C EF: 70.6 %
Single Plane A4C EF: 77.9 %
Weight: 4232.83 [oz_av]

## 2024-06-17 LAB — BASIC METABOLIC PANEL WITH GFR
Anion gap: 9 (ref 5–15)
BUN: 24 mg/dL — ABNORMAL HIGH (ref 8–23)
CO2: 27 mmol/L (ref 22–32)
Calcium: 9.1 mg/dL (ref 8.9–10.3)
Chloride: 103 mmol/L (ref 98–111)
Creatinine, Ser: 1.54 mg/dL — ABNORMAL HIGH (ref 0.61–1.24)
GFR, Estimated: 50 mL/min — ABNORMAL LOW (ref >60)
Glucose, Bld: 122 mg/dL — ABNORMAL HIGH (ref 70–99)
Potassium: 4.6 mmol/L (ref 3.5–5.1)
Sodium: 139 mmol/L (ref 135–145)

## 2024-06-17 LAB — CBC
HCT: 36.3 % — ABNORMAL LOW (ref 39.0–52.0)
Hemoglobin: 10.8 g/dL — ABNORMAL LOW (ref 13.0–17.0)
MCH: 22.3 pg — ABNORMAL LOW (ref 26.0–34.0)
MCHC: 29.8 g/dL — ABNORMAL LOW (ref 30.0–36.0)
MCV: 74.8 fL — ABNORMAL LOW (ref 80.0–100.0)
Platelets: 329 K/uL (ref 150–400)
RBC: 4.85 MIL/uL (ref 4.22–5.81)
RDW: 17.4 % — ABNORMAL HIGH (ref 11.5–15.5)
WBC: 6.8 K/uL (ref 4.0–10.5)
nRBC: 0 % (ref 0.0–0.2)

## 2024-06-17 LAB — LIPID PANEL
Cholesterol: 97 mg/dL (ref 0–200)
HDL: 43 mg/dL (ref >40)
LDL Cholesterol: 29 mg/dL (ref 0–99)
Total CHOL/HDL Ratio: 2.3 ratio
Triglycerides: 123 mg/dL (ref <150)
VLDL: 25 mg/dL (ref 0–40)

## 2024-06-17 LAB — GLUCOSE, CAPILLARY
Glucose-Capillary: 117 mg/dL — ABNORMAL HIGH (ref 70–99)
Glucose-Capillary: 90 mg/dL (ref 70–99)
Glucose-Capillary: 122 mg/dL — ABNORMAL HIGH (ref 70–99)
Glucose-Capillary: 123 mg/dL — ABNORMAL HIGH (ref 70–99)
Glucose-Capillary: 254 mg/dL — ABNORMAL HIGH (ref 70–99)
Glucose-Capillary: 179 mg/dL — ABNORMAL HIGH (ref 70–99)
Glucose-Capillary: 176 mg/dL — ABNORMAL HIGH (ref 70–99)

## 2024-06-17 LAB — HEMOGLOBIN A1C
Hgb A1c MFr Bld: 7.4 % — ABNORMAL HIGH (ref 4.8–5.6)
Mean Plasma Glucose: 165.68 mg/dL

## 2024-06-17 LAB — TSH: TSH: 7.367 u[IU]/mL — ABNORMAL HIGH (ref 0.350–4.500)

## 2024-06-17 LAB — HIV ANTIBODY (ROUTINE TESTING W REFLEX): HIV Screen 4th Generation wRfx: NONREACTIVE

## 2024-06-17 LAB — MAGNESIUM: Magnesium: 2.1 mg/dL (ref 1.7–2.4)

## 2024-06-17 LAB — APTT
aPTT: 32 s (ref 24–36)
aPTT: 35 s (ref 24–36)

## 2024-06-17 MED ORDER — PERFLUTREN LIPID MICROSPHERE
1.0000 mL | INTRAVENOUS | Status: AC | PRN
  Administered 2024-06-17: 2 mL via INTRAVENOUS

## 2024-06-17 MED ORDER — NYSTATIN 100000 UNIT/ML MT SUSP
5.0000 mL | Freq: Four times a day (QID) | OROMUCOSAL | Status: DC
  Administered 2024-06-18 – 2024-06-19 (×7): 500000 [IU] via ORAL
  Filled 2024-06-17 (×9): qty 5

## 2024-06-17 MED ORDER — BISACODYL 5 MG PO TBEC
5.0000 mg | DELAYED_RELEASE_TABLET | Freq: Every day | ORAL | Status: DC | PRN
  Administered 2024-06-18: 5 mg via ORAL
  Filled 2024-06-17: qty 1

## 2024-06-17 MED ORDER — HYDRALAZINE HCL 25 MG PO TABS
25.0000 mg | ORAL_TABLET | Freq: Three times a day (TID) | ORAL | Status: DC | PRN
  Filled 2024-06-17: qty 1

## 2024-06-17 NOTE — Progress Notes (Signed)
 PHARMACY - ANTICOAGULATION CONSULT NOTE  Pharmacy Consult for Heparin   Indication: atrial fibrillation  Allergies  Allergen Reactions   Vibramycin  [Doxycycline ] Hives and Diarrhea   Penicillins Rash    Childhood reaction   Dynabac [Dirithromycin] Nausea And Vomiting and Other (See Comments)    Abdominal pain    Patient Measurements: Height: 5' 9 (175.3 cm) Weight: 120 kg (264 lb 8.8 oz) IBW/kg (Calculated) : 70.7 HEPARIN  DW (KG): 96.3  Vital Signs: Temp: 97.8 F (36.6 C) (09/22 0809) Temp Source: Oral (09/22 0809) BP: 121/96 (09/22 0900) Pulse Rate: 83 (09/22 0915)  Labs: Recent Labs    06/16/24 1646 06/16/24 1925 06/17/24 0916  HGB 10.8*  --  10.8*  HCT 37.0*  --  36.3*  PLT 383  --  329  APTT  --   --  32  CREATININE 1.87*  --   --   TROPONINIHS 19* 19*  --     Estimated Creatinine Clearance: 51 mL/min (A) (by C-G formula based on SCr of 1.87 mg/dL (H)).   Medical History: Past Medical History:  Diagnosis Date   Arthritis    Chronic kidney disease    Constipation    Diabetes mellitus without complication (HCC)    Metformin   Diabetic peripheral neuropathy (HCC)    GERD (gastroesophageal reflux disease)    History of prostate cancer    Hypertension    Peripheral vascular disease (HCC)    Prostate cancer (HCC)    Sleep apnea    has cpap   Stroke [facility])    mini stroke 2016 - no deficitis      Assessment: 49 yoM with hx recent CABG c/b POAF on apixaban admitted with syncope. TVP placed 9/21, pharmacy to dose IV heparin  while apixaban held.  Initial aPTT is subtherapeutic, no bleeding issues.  Goal of Therapy:  Heparin  level 0.3-0.7 units/ml aPTT 66-103 seconds Monitor platelets by anticoagulation protocol: Yes   Plan:  Increase heparin  to 1500 units/h Recheck aPTT in 6h  Ozell Jamaica, PharmD, Echo, [facility] Clinical Pharmacist [phone] Please check AMION for all [facility] Pharmacy numbers 06/17/2024

## 2024-06-17 NOTE — Progress Notes (Signed)
 PHARMACY - ANTICOAGULATION CONSULT NOTE  Pharmacy Consult for Heparin   Indication: atrial fibrillation  Allergies  Allergen Reactions   Vibramycin  [Doxycycline ] Hives and Diarrhea   Penicillins Rash    Childhood reaction   Dynabac [Dirithromycin] Nausea And Vomiting and Other (See Comments)    Abdominal pain    Patient Measurements: Height: 5' 9 (175.3 cm) Weight: 120 kg (264 lb 8.8 oz) IBW/kg (Calculated) : 70.7 HEPARIN  DW (KG): 96.3  Vital Signs: Temp: 98.3 F (36.8 C) (09/22 1554) Temp Source: Oral (09/22 1554) BP: 153/92 (09/22 1900) Pulse Rate: 51 (09/22 1900)  Labs: Recent Labs    06/16/24 1646 06/16/24 1925 06/17/24 0916 06/17/24 1816  HGB 10.8*  --  10.8*  --   HCT 37.0*  --  36.3*  --   PLT 383  --  329  --   APTT  --   --  32 35  CREATININE 1.87*  --  1.54*  --   TROPONINIHS 19* 19*  --   --     Estimated Creatinine Clearance: 62 mL/min (A) (by C-G formula based on SCr of 1.54 mg/dL (H)).   Assessment: 51 yoM with hx recent CABG c/b POAF on apixaban admitted with syncope. TVP placed 9/21, pharmacy to dose IV heparin  while apixaban held.  aPTT remains subtherapeutic (35 sec) on infusion at 1500 units/hr. No issues with line or bleeding reported per RN.  Goal of Therapy:  Heparin  level 0.3-0.7 units/ml aPTT 66-103 seconds Monitor platelets by anticoagulation protocol: Yes   Plan:  Increase heparin  to 1900 units/h Recheck aPTT in 6 hr  Vito Ralph, PharmD, BCPS Please see amion for complete clinical pharmacist phone list 06/17/2024

## 2024-06-17 NOTE — Progress Notes (Addendum)
 Advanced Heart Failure Rounding Note  HF Consulting Cardiologist: Dr. Rolan  Chief Complaint: Presyncope  Subjective:    Feels  a little funny while paced. Otherwise no complaints. No recurrent presyncope.   Objective:   Weight Range: 120 kg Body mass index is 39.07 kg/m.   Vital Signs:   Temp:  [97.8 F (36.6 C)-98.3 F (36.8 C)] 97.8 F (36.6 C) (09/22 0809) Pulse Rate:  [0-85] 83 (09/22 0915) Resp:  [14-27] 24 (09/22 0915) BP: (81-178)/(34-116) 121/96 (09/22 0900) SpO2:  [89 %-99 %] 96 % (09/22 0915) Weight:  [114.8 kg-120 kg] 120 kg (09/22 0500) Last BM Date :  (PTA)  Weight change: Filed Weights   06/16/24 1643 06/17/24 0500  Weight: 114.8 kg 120 kg    Intake/Output:   Intake/Output Summary (Last 24 hours) at 06/17/2024 0933 Last data filed at 06/17/2024 0745 Gross per 24 hour  Intake 698.25 ml  Output 950 ml  Net -251.75 ml      Physical Exam    General:  Well appearing. No resp difficulty HEENT: Normal Neck: Supple. JVP . Carotids 2+ bilat; no bruits. No lymphadenopathy or thyromegaly appreciated. Cor: PMI nondisplaced. Regular rate & rhythm. No rubs, gallops or murmurs. Lungs: Clear Abdomen: Soft, nontender, nondistended. No hepatosplenomegaly. No bruits or masses. Good bowel sounds. Extremities: No cyanosis, clubbing, rash, edema Neuro: Alert & orientedx3, cranial nerves grossly intact. moves all 4 extremities w/o difficulty. Affect pleasant   Telemetry   SR 70s, periods of 2:1 AV block  Labs    CBC Recent Labs    06/16/24 1646  WBC 10.0  NEUTROABS 6.4  HGB 10.8*  HCT 37.0*  MCV 76.8*  PLT 383   Basic Metabolic Panel Recent Labs    90/78/74 1646  NA 138  K 4.5  CL 101  CO2 25  GLUCOSE 140*  BUN 33*  CREATININE 1.87*  CALCIUM 9.0  MG 2.1   Liver Function Tests Recent Labs    06/16/24 1646  AST 41  ALT 37  ALKPHOS 61  BILITOT 0.3  PROT 6.9  ALBUMIN 3.3*   No results for input(s): LIPASE, AMYLASE in  the last 72 hours. Cardiac Enzymes No results for input(s): CKTOTAL, CKMB, CKMBINDEX, TROPONINI in the last 72 hours.  BNP: BNP (last 3 results) No results for input(s): BNP in the last 8760 hours.  ProBNP (last 3 results) No results for input(s): PROBNP in the last 8760 hours.   D-Dimer No results for input(s): DDIMER in the last 72 hours. Hemoglobin A1C No results for input(s): HGBA1C in the last 72 hours. Fasting Lipid Panel No results for input(s): CHOL, HDL, LDLCALC, TRIG, CHOLHDL, LDLDIRECT in the last 72 hours. Thyroid Function Tests No results for input(s): TSH, T4TOTAL, T3FREE, THYROIDAB in the last 72 hours.  Invalid input(s): FREET3  Other results:   Imaging    DG Chest Port 1 View Result Date: 06/16/2024 CLINICAL DATA:  Status post cardiac pacemaker placement. EXAM: PORTABLE CHEST 1 VIEW COMPARISON:  Chest x-ray 06/16/2024 FINDINGS: There is a new right-sided catheter/lead ending in the region of the right ventricle. The heart is enlarged. Sternotomy wires are present. There is no pleural effusion or pneumothorax. There is no focal lung infiltrate. No acute fractures are seen. IMPRESSION: New right-sided catheter/lead ending in the region of the right ventricle. No pneumothorax. Electronically Signed   By: Greig Pique M.D.   On: 06/16/2024 22:07   CARDIAC CATHETERIZATION Result Date: 06/16/2024 Conclusions: Successful placement of 54F temporary  transvenous pacemaker via right internal jugular vein. Pacemaker settings: Rate 70 bpm, output 5 mA. Recommendations: Admit to 2-Heart ICU. Obtain stat portable chest radiograph. Avoid AV nodal blocking agents. EP consultation tomorrow morning. Lonni Hanson, MD [facility]  DG Chest Portable 1 View Result Date: 06/16/2024 CLINICAL DATA:  Status post CABG. EXAM: PORTABLE CHEST 1 VIEW COMPARISON:  Chest x-ray 03/31/2019 FINDINGS: Patient is status post recent cardiac surgery. The  cardiomediastinal silhouette is mildly enlarged. There central pulmonary vascular congestion. There is no focal lung infiltrate, pleural effusion or pneumothorax. New sternotomy wires are present. IMPRESSION: Mild cardiomegaly with central pulmonary vascular congestion. Electronically Signed   By: Greig Pique M.D.   On: 06/16/2024 17:08     Medications:     Scheduled Medications:  aspirin  EC  81 mg Oral Daily   Chlorhexidine  Gluconate Cloth  6 each Topical Daily   insulin  aspart  0-15 Units Subcutaneous TID WC   pantoprazole   40 mg Oral Daily   sodium chloride  flush  3 mL Intravenous Q12H   spironolactone   25 mg Oral Daily    Infusions:  sodium chloride  10 mL/hr at 06/17/24 0700   heparin  1,200 Units/hr (06/17/24 0700)    PRN Medications: mouth rinse    Patient Profile   63 y.o. male with history of CAD s/p CABG X 4 (LIMA to diagonal, SVG to LAD, SVG to PDA, left radial graft to PL of RCA) at OSH 05/10/24, CKD III, DM II, post-op AF post CABG, RA, OSA on CPAP, TIA and CVA in 2018.  Presented with presyncope and CHB s/p TVP.  Assessment/Plan   CHB - Presented with presyncope. Bradycardic w/ episodes of 2:1 AV block and CHB. No significant improvement with dopamine . S/p TVP. - He is now off amiodarone, diltiazem and metoprolol , took all three medications yesterday AM. Continue wash out.  - EP following. Back up rate turned down to 40 and sensitivity adjusted d/t undersensing. - This am SR 70s with periods of 2:1 AV block. - Echo today   2. CAD s/p 4v CABG - CABG was done on 05/10/2024 after a diagnosis of multivessel disease on LHC that was done in the setting of syncopal episodes.  - Query if he was already having intermittent high grade AV block at that time (began having presyncopal episodes around July).  - Continue ASA 81 mg daily  - Not on statin therapy given myalgia. On Repatha at home.   3. Post CABG AF - Hold home apixaban for now (last dose the AM of 9/21) -  Heparin  gtt - Hold amiodarone and AV nodal blocking agents as above    4. T2DM - SSI  5. HTN w/ history of orthostatic hypotension - Continue spiro 25 mg daily  6. AKI on CKD IIIa -Follows with [facility] -Scr baseline recently ~ 1.3-1.5, 1.87 yesterday. Suspect periods of hypotension in setting of CHB. -Restart jardiance prior to discharge  CRITICAL CARE Performed by: COLLETTA MANUELITA SAILOR   Total critical care time: 15 minutes  Critical care time was exclusive of separately billable procedures and treating other patients.  Critical care was necessary to treat or prevent imminent or life-threatening deterioration.  Critical care was time spent personally by me on the following activities: development of treatment plan with patient and/or surrogate as well as nursing, discussions with consultants, evaluation of patient's response to treatment, examination of patient, obtaining history from patient or surrogate, ordering and performing treatments and interventions, ordering and review of laboratory studies, ordering and  review of radiographic studies, pulse oximetry and re-evaluation of patient's condition.   Length of Stay: 1  FINCH, LINDSAY N, PA-C  06/17/2024, 9:33 AM  Advanced Heart Failure Team Pager [phone] (M-F; 7a - 5p)  Please contact [facility] for night-coverage after hours (5p -7a ) and weekends on [email or web address]  Patient seen with PA, I formulated the plan and agree with the above note.   Intermittent 2:1 heart block noted, predominantly NSR with rate around 50.  Pacing backup decreased to 40 bpm.   No complaints, no chest pain.   General: NAD Neck: Thick. No JVD, no thyromegaly or thyroid nodule.  Lungs: Clear to auscultation bilaterally with normal respiratory effort. CV: Nondisplaced PMI.  Heart regular S1/S2, no S3/S4, no murmur.  No peripheral edema.  No carotid bruit.  Normal pedal pulses.  Abdomen: Soft, nontender, no hepatosplenomegaly, no distention.  Skin:  Intact without lesions or rashes.  Neurologic: Alert and oriented x 3.  Psych: Normal affect. Extremities: No clubbing or cyanosis.  HEENT: Normal.    Still with intermittent short runs of 2:1 AVB.  Waiting for nodal blocker washout to determine PPM need.  EP following.  Troponin was not significantly elevated.   Will get echo.  Interestingly, patient's presenting symptoms for CABG were episodes of pre-syncope.  TTVP in place and functioning appropriately.   H/o PAF but in NSR.  Home Eliquis is being held, on heparin  gtt.   CRITICAL CARE Performed by: Ezra Shuck  Total critical care time: 35 minutes  Critical care time was exclusive of separately billable procedures and treating other patients.  Critical care was necessary to treat or prevent imminent or life-threatening deterioration.  Critical care was time spent personally by me on the following activities: development of treatment plan with patient and/or surrogate as well as nursing, discussions with consultants, evaluation of patient's response to treatment, examination of patient, obtaining history from patient or surrogate, ordering and performing treatments and interventions, ordering and review of laboratory studies, ordering and review of radiographic studies, pulse oximetry and re-evaluation of patient's condition.  Ezra Shuck 06/17/2024 1:41 PM

## 2024-06-17 NOTE — Progress Notes (Signed)
  EKG this am shows 1:1 sinus rhythm.   Pt sleeping on my exam with intermittent 2:1 AV block. Occasional V pacing over his NSR.   Pacer turned down to 40 and sensitivity adjusted due to undersensing.   Pt reports taking his Toprol  and Diltiazem both yesterday am.   Will continue wash out.  EP to follow along.  OK to eat today.   Ozell Jodie Passey, PA-C  06/17/2024 8:12 AM

## 2024-06-17 NOTE — Progress Notes (Signed)
   06/17/24 0225  BiPAP/CPAP/SIPAP  BiPAP/CPAP/SIPAP Pt Type Adult  BiPAP/CPAP/SIPAP DREAMSTATIOND  Mask Type Full face mask  Mask Size Large  Respiratory Rate 17 breaths/min  EPAP 10 cmH2O  Flow Rate 2 lpm  Patient Home Machine No  Patient Home Mask No  Patient Home Tubing No  Auto Titrate No  BiPAP/CPAP /SiPAP Vitals  Pulse Rate 80  Resp 17  SpO2 94 %  Bilateral Breath Sounds Clear;Diminished  MEWS Score/Color  MEWS Score 0  MEWS Score Color Chase Harris

## 2024-06-17 NOTE — TOC Initial Note (Addendum)
 Transition of Care [facility]) - Initial/Assessment Note    Patient Details  Name: Chase Harris MRN: [id number] Date of Birth: Apr 17, 1961  [facility]) CM/SW Contact:    Sudie Erminio Deems, RN Phone Number: 06/17/2024, 1:37 PM  Clinical Narrative:  Patient presented for near syncope. PTA patient was from home with spouse. Spouse was at the bedside during the visit. Patient has DME CPAP at home. Per patient he is active with [facility] for RN services. Inpatient Case Manager has called them to see if they need additional orders to continue care-awaiting call back. Patient states he is post CABG that was done in [facility]. Inpatient Case Manager will continue to follow for additional disposition needs as the patient progresses.                  1345 06-17-24 [facility] will need orders for [facility] RN and F2F as the patient gets closer to discharge home.   Expected Discharge Plan: Home w Home Health Services Barriers to Discharge: Continued Medical Work up   Patient Goals and CMS Choice Patient states their goals for this hospitalization and ongoing recovery are:: Plans to return home with spouse.   Expected Discharge Plan and Services In-house Referral: NA Discharge Planning Services: CM Consult Post Acute Care Choice: Home Health Living arrangements for the past 2 months: Single Family Home                   DME Agency: NA  Prior Living Arrangements/Services Living arrangements for the past 2 months: Single Family Home Lives with:: Spouse Patient language and need for interpreter reviewed:: Yes Do you feel safe going back to the place where you live?: Yes      Need for Family Participation in Patient Care: No (Comment) Care giver support system in place?: No (comment) Current home services: DME (CPAP) Criminal Activity/Legal Involvement Pertinent to Current Situation/Hospitalization: No - Comment as needed  Activities of Daily Living      Permission  Sought/Granted Permission sought to share information with : Family Supports, Case Manager                Emotional Assessment Appearance:: Appears stated age Attitude/Demeanor/Rapport: Engaged Affect (typically observed): Appropriate Orientation: : Oriented to Self, Oriented to Place, Oriented to  Time, Oriented to Situation Alcohol / Substance Use: Not Applicable Psych Involvement: No (comment)  Admission diagnosis:  Complete heart block (HCC) [I44.2] Patient Active Problem List   Diagnosis Date Noted   Third degree heart block (HCC) 06/16/2024   Status post coronary artery bypass graft 06/16/2024   Syncope and collapse 06/16/2024   Complete heart block (HCC) 06/16/2024   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   Chronic venous insufficiency 11/22/2021   Plantar fasciitis 10/11/2019   Sepsis (HCC) 04/07/2019   Diabetic retinopathy associated with type 2 diabetes mellitus (HCC) 03/01/2019   Localized osteoarthritis of right knee 05/22/2018   Chronic pain of right knee 01/22/2018   Varicose veins of leg with pain, bilateral 10/17/2017   Painful orthopaedic hardware (HCC) 04/17/2017   DM type 2 with diabetic peripheral neuropathy (HCC) 02/13/2017   Achilles tendinitis of left lower extremity 09/22/2016   Vaccine counseling 09/09/2016   Atherosclerosis of native arteries of extremity with intermittent claudication (HCC) 08/23/2016   Essential hypertension with goal blood pressure less than 140/90 08/22/2016   History of prostate cancer 08/22/2016   IDDM (insulin  dependent diabetes mellitus) 08/22/2016   Mixed hyperlipidemia 08/22/2016   Non morbid obesity  due to excess calories 08/22/2016   Sleep apnea 08/22/2016   Trigger little finger of right hand 06/13/2016   Encounter for long-term (current) use of high-risk medication 04/04/2016   Bilateral hand pain 03/21/2016   Inflammatory arthritis 03/21/2016   Type 2 diabetes mellitus (HCC) 03/21/2016   Seronegative rheumatoid arthritis (HCC)  03/21/2016   Headache 03/17/2015   PCP:  Alla Amis, MD Pharmacy:   [facility] - [place], [place] - [address] [place] [address] Phone: [phone] Fax: [phone]     Social Drivers of Health (SDOH) Social History: SDOH Screenings   Food Insecurity: No Food Insecurity (05/08/2024)   Received from [facility]  Housing: Low Risk (05/08/2024)   Received from [facility]  Transportation Needs: No Transportation Needs (05/08/2024)   Received from [facility]  Utilities: Not At Risk (05/08/2024)   Received from [facility]  Financial Resource Strain: Low Risk (05/08/2024)   Received from [facility]  Physical Activity: Insufficiently Active (05/08/2024)   Received from [facility]  Social Connections: Moderately Integrated (05/08/2024)   Received from [facility]  Stress: Patient Declined (05/08/2024)   Received from [facility]  Tobacco Use: Medium Risk (06/16/2024)  Health Literacy: Adequate Health Literacy (05/06/2024)   Received from [facility]   SDOH Interventions:     Readmission Risk Interventions     No data to display

## 2024-06-18 ENCOUNTER — Inpatient Hospital Stay (HOSPITAL_COMMUNITY)
Admission: EM | Disposition: A | Discharge status: Home Health | Payer: Self-pay | Source: Home / Self Care | Attending: Internal Medicine

## 2024-06-18 DIAGNOSIS — I2510 Atherosclerotic heart disease of native coronary artery without angina pectoris: Secondary | ICD-10-CM | POA: Diagnosis not present

## 2024-06-18 DIAGNOSIS — I441 Atrioventricular block, second degree: Secondary | ICD-10-CM

## 2024-06-18 DIAGNOSIS — N179 Acute kidney failure, unspecified: Secondary | ICD-10-CM | POA: Diagnosis not present

## 2024-06-18 DIAGNOSIS — I442 Atrioventricular block, complete: Secondary | ICD-10-CM | POA: Diagnosis not present

## 2024-06-18 DIAGNOSIS — N1831 Chronic kidney disease, stage 3a: Secondary | ICD-10-CM | POA: Diagnosis not present

## 2024-06-18 HISTORY — PX: PACEMAKER IMPLANT: EP1218

## 2024-06-18 LAB — BASIC METABOLIC PANEL WITH GFR
Anion gap: 13 (ref 5–15)
Anion gap: 12 (ref 5–15)
BUN: 18 mg/dL (ref 8–23)
BUN: 20 mg/dL (ref 8–23)
CO2: 22 mmol/L (ref 22–32)
CO2: 24 mmol/L (ref 22–32)
Calcium: 9.3 mg/dL (ref 8.9–10.3)
Calcium: 9.2 mg/dL (ref 8.9–10.3)
Chloride: 101 mmol/L (ref 98–111)
Chloride: 100 mmol/L (ref 98–111)
Creatinine, Ser: 1.49 mg/dL — ABNORMAL HIGH (ref 0.61–1.24)
Creatinine, Ser: 1.56 mg/dL — ABNORMAL HIGH (ref 0.61–1.24)
GFR, Estimated: 52 mL/min — ABNORMAL LOW (ref >60)
GFR, Estimated: 49 mL/min — ABNORMAL LOW (ref >60)
Glucose, Bld: 146 mg/dL — ABNORMAL HIGH (ref 70–99)
Glucose, Bld: 116 mg/dL — ABNORMAL HIGH (ref 70–99)
Potassium: 4.4 mmol/L (ref 3.5–5.1)
Potassium: 4.2 mmol/L (ref 3.5–5.1)
Sodium: 136 mmol/L (ref 135–145)
Sodium: 136 mmol/L (ref 135–145)

## 2024-06-18 LAB — HEPARIN LEVEL (UNFRACTIONATED)
Heparin Unfractionated: 0.4 [IU]/mL (ref 0.30–0.70)
Heparin Unfractionated: 0.3 [IU]/mL (ref 0.30–0.70)

## 2024-06-18 LAB — CBC
HCT: 40 % (ref 39.0–52.0)
HCT: 41.2 % (ref 39.0–52.0)
Hemoglobin: 11.6 g/dL — ABNORMAL LOW (ref 13.0–17.0)
Hemoglobin: 11.9 g/dL — ABNORMAL LOW (ref 13.0–17.0)
MCH: 21.9 pg — ABNORMAL LOW (ref 26.0–34.0)
MCH: 21.8 pg — ABNORMAL LOW (ref 26.0–34.0)
MCHC: 29 g/dL — ABNORMAL LOW (ref 30.0–36.0)
MCHC: 28.9 g/dL — ABNORMAL LOW (ref 30.0–36.0)
MCV: 75.5 fL — ABNORMAL LOW (ref 80.0–100.0)
MCV: 75.3 fL — ABNORMAL LOW (ref 80.0–100.0)
Platelets: 362 K/uL (ref 150–400)
Platelets: 387 K/uL (ref 150–400)
RBC: 5.3 MIL/uL (ref 4.22–5.81)
RBC: 5.47 MIL/uL (ref 4.22–5.81)
RDW: 17.3 % — ABNORMAL HIGH (ref 11.5–15.5)
RDW: 17.4 % — ABNORMAL HIGH (ref 11.5–15.5)
WBC: 8.8 K/uL (ref 4.0–10.5)
WBC: 7.3 K/uL (ref 4.0–10.5)
nRBC: 0 % (ref 0.0–0.2)
nRBC: 0 % (ref 0.0–0.2)

## 2024-06-18 LAB — APTT
aPTT: 35 s (ref 24–36)
aPTT: 28 s (ref 24–36)

## 2024-06-18 LAB — GLUCOSE, CAPILLARY
Glucose-Capillary: 145 mg/dL — ABNORMAL HIGH (ref 70–99)
Glucose-Capillary: 330 mg/dL — ABNORMAL HIGH (ref 70–99)
Glucose-Capillary: 124 mg/dL — ABNORMAL HIGH (ref 70–99)
Glucose-Capillary: 128 mg/dL — ABNORMAL HIGH (ref 70–99)

## 2024-06-18 LAB — SURGICAL PCR SCREEN
MRSA, PCR: NEGATIVE
Staphylococcus aureus: NEGATIVE

## 2024-06-18 LAB — MAGNESIUM: Magnesium: 1.9 mg/dL (ref 1.7–2.4)

## 2024-06-18 SURGERY — PACEMAKER IMPLANT
Anesthesia: LOCAL

## 2024-06-18 MED ORDER — MAGNESIUM SULFATE 2 GM/50ML IV SOLN
2.0000 g | Freq: Once | INTRAVENOUS | Status: AC
  Administered 2024-06-18: 2 g via INTRAVENOUS
  Filled 2024-06-18: qty 50

## 2024-06-18 MED ORDER — SODIUM CHLORIDE 0.9% FLUSH
3.0000 mL | Freq: Two times a day (BID) | INTRAVENOUS | Status: DC
  Administered 2024-06-18 – 2024-06-19 (×3): 3 mL via INTRAVENOUS

## 2024-06-18 MED ORDER — LIDOCAINE HCL 1 % IJ SOLN
INTRAMUSCULAR | Status: AC
  Filled 2024-06-18: qty 60

## 2024-06-18 MED ORDER — ONDANSETRON HCL 4 MG/2ML IJ SOLN: 4.0000 mg | Freq: Four times a day (QID) | INTRAMUSCULAR | Status: DC | PRN

## 2024-06-18 MED ORDER — FENTANYL CITRATE (PF) 100 MCG/2ML IJ SOLN
INTRAMUSCULAR | Status: AC
  Filled 2024-06-18: qty 2

## 2024-06-18 MED ORDER — TRAZODONE HCL 50 MG PO TABS
50.0000 mg | ORAL_TABLET | Freq: Once | ORAL | Status: AC
  Administered 2024-06-18: 50 mg via ORAL
  Filled 2024-06-18: qty 1

## 2024-06-18 MED ORDER — ACETAMINOPHEN 325 MG PO TABS
325.0000 mg | ORAL_TABLET | ORAL | Status: DC | PRN
  Administered 2024-06-18 – 2024-06-19 (×2): 650 mg via ORAL
  Filled 2024-06-18 (×2): qty 2

## 2024-06-18 MED ORDER — SODIUM CHLORIDE 0.9 % IV SOLN
250.0000 mL | INTRAVENOUS | Status: DC
  Administered 2024-06-18: 250 mL via INTRAVENOUS

## 2024-06-18 MED ORDER — SODIUM CHLORIDE 0.9 % IV SOLN: INTRAVENOUS | Status: DC

## 2024-06-18 MED ORDER — SODIUM CHLORIDE 0.9 % IV SOLN
80.0000 mg | INTRAVENOUS | Status: AC
  Administered 2024-06-18: 80 mg
  Filled 2024-06-18: qty 2

## 2024-06-18 MED ORDER — ONDANSETRON HCL 4 MG/2ML IJ SOLN
4.0000 mg | Freq: Once | INTRAMUSCULAR | Status: AC
  Administered 2024-06-18: 4 mg via INTRAVENOUS
  Filled 2024-06-18: qty 2

## 2024-06-18 MED ORDER — CHLORHEXIDINE GLUCONATE 4 % EX SOLN: 60.0000 mL | Freq: Once | CUTANEOUS | Status: DC

## 2024-06-18 MED ORDER — FENTANYL CITRATE (PF) 100 MCG/2ML IJ SOLN
INTRAMUSCULAR | Status: DC | PRN
  Administered 2024-06-18 (×2): 25 ug via INTRAVENOUS

## 2024-06-18 MED ORDER — CEFAZOLIN SODIUM-DEXTROSE 2-4 GM/100ML-% IV SOLN
INTRAVENOUS | Status: AC
  Filled 2024-06-18: qty 100

## 2024-06-18 MED ORDER — CEFAZOLIN SODIUM-DEXTROSE 2-4 GM/100ML-% IV SOLN
2.0000 g | INTRAVENOUS | Status: AC
  Administered 2024-06-18: 2 g via INTRAVENOUS
  Filled 2024-06-18: qty 100

## 2024-06-18 MED ORDER — CHLORHEXIDINE GLUCONATE 4 % EX SOLN
60.0000 mL | Freq: Once | CUTANEOUS | Status: AC
  Administered 2024-06-18: 4 via TOPICAL
  Filled 2024-06-18: qty 60

## 2024-06-18 MED ORDER — MIDAZOLAM HCL 2 MG/2ML IJ SOLN
INTRAMUSCULAR | Status: AC
  Filled 2024-06-18: qty 2

## 2024-06-18 MED ORDER — SODIUM CHLORIDE 0.9% FLUSH: 3.0000 mL | INTRAVENOUS | Status: DC | PRN

## 2024-06-18 MED ORDER — MIDAZOLAM HCL 5 MG/5ML IJ SOLN
INTRAMUSCULAR | Status: DC | PRN
  Administered 2024-06-18 (×2): 1 mg via INTRAVENOUS

## 2024-06-18 MED ORDER — SODIUM CHLORIDE 0.9 % IV SOLN
INTRAVENOUS | Status: AC
  Filled 2024-06-18: qty 2

## 2024-06-18 MED ORDER — LIDOCAINE HCL (PF) 1 % IJ SOLN
INTRAMUSCULAR | Status: DC | PRN
  Administered 2024-06-18: 30 mL

## 2024-06-18 MED ORDER — FUROSEMIDE 10 MG/ML IJ SOLN
40.0000 mg | Freq: Once | INTRAMUSCULAR | Status: AC
  Administered 2024-06-18: 40 mg via INTRAVENOUS
  Filled 2024-06-18: qty 4

## 2024-06-18 MED ORDER — CEFAZOLIN SODIUM-DEXTROSE 1-4 GM/50ML-% IV SOLN
1.0000 g | Freq: Four times a day (QID) | INTRAVENOUS | Status: AC
  Administered 2024-06-18 – 2024-06-19 (×3): 1 g via INTRAVENOUS
  Filled 2024-06-18 (×3): qty 50

## 2024-06-18 SURGICAL SUPPLY — 14 items
CABLE SURGICAL S-101-97-12 (CABLE) ×2 IMPLANT
CATH CPS LOCATOR 3D MED (CATHETERS) IMPLANT
LEAD ULTIPACE 52 LPA1231/52 (Lead) IMPLANT
LEAD ULTIPACE 65 LPA1231/65 (Lead) IMPLANT
MAT PREVALON FULL STRYKER (MISCELLANEOUS) IMPLANT
PACEMAKER ASSURITY DR-RF (Pacemaker) IMPLANT
PAD DEFIB RADIO PHYSIO CONN (PAD) ×2 IMPLANT
SHEATH 7FR PRELUDE SNAP 13 (SHEATH) IMPLANT
SHEATH 9FR PRELUDE SNAP 13 (SHEATH) IMPLANT
SHEATH PROBE COVER 6X72 (BAG) IMPLANT
SLITTER AGILIS HISPRO (INSTRUMENTS) IMPLANT
TOOL HELIX LOCKING (MISCELLANEOUS) IMPLANT
TRAY PACEMAKER INSERTION (PACKS) ×2 IMPLANT
WIRE HI TORQ VERSACORE-J 145CM (WIRE) IMPLANT

## 2024-06-18 NOTE — Consult Note (Addendum)
 ELECTROPHYSIOLOGY CONSULT NOTE    Patient ID: Chase Harris MRN: [id number], DOB/AGE: 1961-07-15 63 y.o.  Admit date: 06/16/2024 Date of Consult: 06/18/2024  Primary Physician: Alla Amis, MD Primary Cardiologist: None  Electrophysiologist: New   Referring Provider: Dr. Mady  Patient Profile: Chase Harris is a 63 y.o. male with a history of  CAD s/p 4v CABG with LIMA to diagonal, SVG to LAD, SVG to PDA, and left radial to RPL on 05/10/2024, post-CABG AF on amiodarone and apixaban, HTN, T2DM, HLD, CKD, OSA on CPAP, RA, TIA, and CVA in 2018 who is being seen today for the evaluation intermittent CHB at the request of Dr. Mady.  HPI:  Chase Harris is a 63 y.o. male with medical history as above.   Presented to [facility] 9/21 with near syncope. Found to be intermittently in CHB. Given history of CABG and symptoms of lightheadedness at rest, taken for TVP. Initially attempted dopamine  but pt felt worse.   Amiodarone, metoprolol , and diltiazem were all held.   Initially he had some improvement in conduction, but yesterday afternoon and evening has been in 2:1 AV block with intermittent AF/AFL.   EP had been aware of pt and seeing today to discuss PPM given persistent heart block despite >48 hrs of washout.   Currently, he feels somewhat fatigue, but has been primarily resting in bed. Currently in 2:1 AVB in 40-50s.  Denies chest pain or frank syncope. Did have similar feelings of near syncope prior to his CABG. HS trop flat.  Labs Potassium4.4 (09/23 0447) Magnesium   2.1 (09/22 0916) Creatinine, ser  1.49* (09/23 0447) PLT  362 (09/23 0447) HGB  11.6* (09/23 0447) WBC 8.8 (09/23 0447) Troponin I (High Sensitivity)19* (09/21 1925).    Past Medical History:  Diagnosis Date   Arthritis    Chronic kidney disease    Constipation    Diabetes mellitus without complication (HCC)    Metformin   Diabetic peripheral neuropathy (HCC)    GERD (gastroesophageal reflux  disease)    History of prostate cancer    Hypertension    Peripheral vascular disease    Prostate cancer (HCC)    Sleep apnea    has cpap   Stroke [facility])    mini stroke 2016 - no deficitis     Surgical History:  Past Surgical History:  Procedure Laterality Date   COLONOSCOPY WITH PROPOFOL  N/A 06/27/2018   Procedure: COLONOSCOPY WITH PROPOFOL ;  Surgeon: Viktoria Lamar DASEN, MD;  Location: [facility] ENDOSCOPY;  Service: Endoscopy;  Laterality: N/A;   CORONARY ARTERY BYPASS GRAFT     ESOPHAGOGASTRODUODENOSCOPY (EGD) WITH PROPOFOL  N/A 06/27/2018   Procedure: ESOPHAGOGASTRODUODENOSCOPY (EGD) WITH PROPOFOL ;  Surgeon: Viktoria Lamar DASEN, MD;  Location: [facility] ENDOSCOPY;  Service: Endoscopy;  Laterality: N/A;   HERNIA REPAIR     PROSTATECTOMY     TAYLOR BUNIONECTOMY     TEMPORARY PACEMAKER N/A 06/16/2024   Procedure: TEMPORARY PACEMAKER;  Surgeon: Mady Bruckner, MD;  Location: [facility] INVASIVE CV LAB;  Service: Cardiovascular;  Laterality: N/A;   TOTAL KNEE ARTHROPLASTY Left 02/10/2023   Procedure: TOTAL KNEE ARTHROPLASTY;  Surgeon: Kay Kemps, MD;  Location: [facility] ORS;  Service: Orthopedics;  Laterality: Left;  choice with interscalene block   WISDOM TOOTH EXTRACTION       Medications Prior to Admission  Medication Sig Dispense Refill Last Dose/Taking   acetaminophen  (TYLENOL ) 500 MG tablet Take 1,500 mg by mouth 2 (two) times daily as needed for headache or fever (pain).  Past Week   allopurinol (ZYLOPRIM) 300 MG tablet Take 300 mg by mouth daily.   06/16/2024 Morning   amiodarone (PACERONE) 200 MG tablet Take 200 mg by mouth daily.   06/16/2024 Morning   aspirin  EC 81 MG tablet Take 81 mg by mouth daily.   06/16/2024 Morning   diltiazem (DILACOR XR) 120 MG 24 hr capsule Take 120 mg by mouth daily.   06/16/2024 Morning   ELIQUIS 5 MG TABS tablet Take 5 mg by mouth 2 (two) times daily.   06/16/2024 at  9:00 AM   etanercept (ENBREL) 50 MG/ML injection Inject 50 mg into the skin every Wednesday.    06/12/2024   Evolocumab (REPATHA) 140 MG/ML SOSY Inject 140 mg into the skin See admin instructions. Inject 1mL (140mg ) into the skin every other Thursday.   06/14/2024   FARXIGA 10 MG TABS tablet Take 10 mg by mouth daily.   06/16/2024 Morning   fenofibrate 160 MG tablet Take 160 mg by mouth daily.   06/16/2024 Morning   fluticasone (FLONASE) 50 MCG/ACT nasal spray Place 2 sprays into the nose daily as needed for allergies or rhinitis.    Past Week   furosemide  (LASIX ) 20 MG tablet Take 20 mg by mouth daily.   06/16/2024 Morning   gabapentin  (NEURONTIN ) 600 MG tablet TAKE ONE TABLET (600 MG TOTAL) BY MOUTH THREE TIMES DAILY. 270 tablet 0 06/16/2024 Morning   hydroxychloroquine (PLAQUENIL) 200 MG tablet Take 200 mg by mouth 2 (two) times daily.   Past Week   imipramine (TOFRANIL) 50 MG tablet Take 50 mg by mouth at bedtime.   Past Week   insulin  aspart (FIASP  FLEXTOUCH) 100 UNIT/ML FlexTouch Pen Inject 45 Units into the skin with breakfast, with lunch, and with evening meal.   06/16/2024 Morning   insulin  degludec (TRESIBA FLEXTOUCH) 100 UNIT/ML FlexTouch Pen Inject 140 Units into the skin at bedtime.   Past Week   loratadine (CLARITIN) 10 MG tablet Take 10 mg by mouth daily.    06/16/2024 Morning   metFORMIN (GLUCOPHAGE) 1000 MG tablet Take 1,000 mg by mouth 2 (two) times a day.    06/16/2024 Morning   metoprolol  tartrate (LOPRESSOR ) 50 MG tablet Take 50 mg by mouth 2 (two) times daily.   06/16/2024 Morning   Multiple Vitamins-Minerals (MULTIVITAMIN MEN 50+) TABS Take 1 tablet by mouth daily.   06/16/2024 Morning   nystatin  (MYCOSTATIN ) 100000 UNIT/ML suspension Take 4 mLs by mouth 4 (four) times daily. 10 day course   06/16/2024 Morning   omeprazole (PRILOSEC) 40 MG capsule Take 40 mg by mouth daily.   06/16/2024 Morning   oxyCODONE  (OXY IR/ROXICODONE ) 5 MG immediate release tablet Take 5 mg by mouth every 4 (four) hours as needed for severe pain (pain score 7-10).   Past Month   spironolactone  (ALDACTONE ) 25  MG tablet Take 25 mg by mouth daily.   06/16/2024 Morning   predniSONE (DELTASONE) 2.5 MG tablet Take 2.5 mg by mouth daily with breakfast. (Patient not taking: Reported on 06/17/2024)   Not Taking    Inpatient Medications:   Chlorhexidine  Gluconate Cloth  6 each Topical Daily   insulin  aspart  0-15 Units Subcutaneous TID WC   nystatin   5 mL Oral QID   pantoprazole   40 mg Oral Daily   sodium chloride  flush  3 mL Intravenous Q12H   sodium chloride  flush  3 mL Intravenous Q12H   spironolactone   25 mg Oral Daily    Allergies:  Allergies  Allergen  Reactions   Vibramycin  [Doxycycline ] Hives and Diarrhea   Penicillins Rash    Childhood reaction   Dynabac [Dirithromycin] Nausea And Vomiting and Other (See Comments)    Abdominal pain    Family History  Problem Relation Age of Onset   Congestive Heart Failure Father    Hypertension Father    Kidney disease Father    Diabetes Paternal Grandmother    Diabetes Paternal Aunt    Hypothyroidism Mother      Physical Exam: Vitals:   06/18/24 0400 06/18/24 0500 06/18/24 0600 06/18/24 0700  BP: (!) 144/63 137/83 (!) 155/53 (!) 140/57  Pulse: (!) 46 (!) 44 69 (!) 47  Resp: 12 17 17 17   Temp:   97.9 F (36.6 C) 98.4 F (36.9 C)  TempSrc:   Oral Axillary  SpO2: 94% 95% 94% 97%  Weight:  117.1 kg    Height:        GEN- NAD, A&O x 3, normal affect HEENT: Normocephalic, atraumatic Lungs- CTAB, Normal effort.  Heart- Regular rate and rhythm, No M/G/R.  GI- Soft, NT, ND.  Extremities- No clubbing, cyanosis, or edema   Radiology/Studies: ECHOCARDIOGRAM COMPLETE Result Date: 06/17/2024    ECHOCARDIOGRAM REPORT   Patient Name:   Chase Harris Date of Exam: 06/17/2024 Medical Rec #:  [id number]           Height:       69.0 in Accession #:    [id number]          Weight:       264.6 lb Date of Birth:  1960/12/19            BSA:          2.327 m Patient Age:    63 years            BP:           141/63 mmHg Patient Gender: M                    HR:           48 bpm. Exam Location:  Inpatient Procedure: 2D Echo and Intracardiac Opacification Agent (Both Spectral and Color            Flow Doppler were utilized during procedure). Indications:    Syncope, Complete heart block  History:        Patient has prior history of Echocardiogram examinations. CAD;                 Prior CABG.  Sonographer:    Charmaine Gaskins Referring Phys: [id number] GILLIAN HERO [facility]  Sonographer Comments: Technically difficult study due to poor echo windows. IMPRESSIONS  1. Left ventricular ejection fraction, by estimation, is 60 to 65%. The left ventricle has normal function. The left ventricle has no regional wall motion abnormalities. Left ventricular diastolic function could not be evaluated.  2. Right ventricular systolic function is normal. The right ventricular size is normal.  3. The mitral valve is normal in structure. No evidence of mitral valve regurgitation. No evidence of mitral stenosis.  4. The aortic valve is normal in structure. Aortic valve regurgitation is not visualized. No aortic stenosis is present.  5. The inferior vena cava is dilated in size with <50% respiratory variability, suggesting right atrial pressure of 15 mmHg. FINDINGS  Left Ventricle: Left ventricular ejection fraction, by estimation, is 60 to 65%. The left ventricle has normal function. The left ventricle has no regional wall motion  abnormalities. Definity  contrast agent was given IV to delineate the left ventricular  endocardial borders. The left ventricular internal cavity size was normal in size. There is no left ventricular hypertrophy. Left ventricular diastolic function could not be evaluated due to nondiagnostic images. Left ventricular diastolic function could not be evaluated. Right Ventricle: The right ventricular size is normal. No increase in right ventricular wall thickness. Right ventricular systolic function is normal. Left Atrium: Left atrial size was normal in size. Right Atrium: Right  atrial size was normal in size. Pericardium: There is no evidence of pericardial effusion. Mitral Valve: The mitral valve is normal in structure. No evidence of mitral valve regurgitation. No evidence of mitral valve stenosis. Tricuspid Valve: The tricuspid valve is normal in structure. Tricuspid valve regurgitation is not demonstrated. No evidence of tricuspid stenosis. Aortic Valve: The aortic valve is normal in structure. Aortic valve regurgitation is not visualized. No aortic stenosis is present. Pulmonic Valve: The pulmonic valve was normal in structure. Pulmonic valve regurgitation is not visualized. No evidence of pulmonic stenosis. Aorta: The aortic root is normal in size and structure. Venous: The inferior vena cava is dilated in size with less than 50% respiratory variability, suggesting right atrial pressure of 15 mmHg. IAS/Shunts: No atrial level shunt detected by color flow Doppler.  LEFT VENTRICLE PLAX 2D LVIDd:         4.69 cm LVIDs:         2.72 cm LV PW:         1.25 cm LV IVS:        1.34 cm LVOT diam:     2.19 cm LVOT Area:     3.77 cm  LV Volumes (MOD) LV vol d, MOD A2C: 90.1 ml LV vol d, MOD A4C: 136.0 ml LV vol s, MOD A2C: 26.5 ml LV vol s, MOD A4C: 30.1 ml LV SV MOD A2C:     63.6 ml LV SV MOD A4C:     136.0 ml LV SV MOD BP:      87.4 ml RIGHT VENTRICLE RV S prime:     12.70 cm/s LEFT ATRIUM             Index LA diam:        4.66 cm 2.00 cm/m LA Vol (A2C):   76.0 ml 32.66 ml/m LA Vol (A4C):   81.2 ml 34.90 ml/m LA Biplane Vol: 86.8 ml 37.30 ml/m   AORTA Ao Asc diam: 3.30 cm  SHUNTS Systemic Diam: 2.19 cm Aditya Sabharwal Electronically signed by Ria Commander Signature Date/Time: 06/17/2024/4:54:20 PM    Final    DG Chest Port 1 View Result Date: 06/16/2024 CLINICAL DATA:  Status post cardiac pacemaker placement. EXAM: PORTABLE CHEST 1 VIEW COMPARISON:  Chest x-ray 06/16/2024 FINDINGS: There is a new right-sided catheter/lead ending in the region of the right ventricle. The heart is  enlarged. Sternotomy wires are present. There is no pleural effusion or pneumothorax. There is no focal lung infiltrate. No acute fractures are seen. IMPRESSION: New right-sided catheter/lead ending in the region of the right ventricle. No pneumothorax. Electronically Signed   By: Greig Pique M.D.   On: 06/16/2024 22:07   CARDIAC CATHETERIZATION Result Date: 06/16/2024 Conclusions: Successful placement of 29F temporary transvenous pacemaker via right internal jugular vein. Pacemaker settings: Rate 70 bpm, output 5 mA. Recommendations: Admit to 2-Heart ICU. Obtain stat portable chest radiograph. Avoid AV nodal blocking agents. EP consultation tomorrow morning. Lonni Hanson, MD [facility]  DG Chest Portable 1  View Result Date: 06/16/2024 CLINICAL DATA:  Status post CABG. EXAM: PORTABLE CHEST 1 VIEW COMPARISON:  Chest x-ray 03/31/2019 FINDINGS: Patient is status post recent cardiac surgery. The cardiomediastinal silhouette is mildly enlarged. There central pulmonary vascular congestion. There is no focal lung infiltrate, pleural effusion or pneumothorax. New sternotomy wires are present. IMPRESSION: Mild cardiomegaly with central pulmonary vascular congestion. Electronically Signed   By: Greig Pique M.D.   On: 06/16/2024 17:08    EKG: on arrival shows CHB with junctional escape in 30-40s (personally reviewed)  Baseline EKG with RBBB and ? PAFB.   TELEMETRY: 2:1 AV block throughout the day yesterday and this am.  Brief periods of conduction, as well as brief periods of AF/AFL (personally reviewed)  Assessment/Plan:  Second degree AV block Persists despite AV nodal agent wash out.  At least bifascicular block at baseline.  Explained risks, benefits, and alternatives to PPM implantation, including but not limited to bleeding, infection, pneumothorax, pericardial effusion, lead dislodgement, heart attack, stroke, or death.  Pt verbalized understanding and agrees to proceed.    OAC  held  Paroxysmal AF/ AFL Hold OAC for PPM Likely can resume amiodarone post PPM - vs discuss having come back for tikosyn to avoid possible long term side effects of amio  CAD s/p CABG 04/2024 Denies s/s ischemia. HS troponin flat.   Dr. Inocencio has seen. Tentatively plan for PPM this afternoon pending staff availability.   For questions or updates, please contact [facility] Please consult [email or web address] for contact info under     Signed, Chase Harris  06/18/2024, 8:24 AM     Chase Harris was seen by me today along with Chase Harris. I have personally performed an evaluation on this patient.  My findings are as follows: 63 y.o. male patient with a past history as above.  He had a CABG times four 1 month ago.  Post CABG he went into atrial fibrillation and was on amiodarone and Eliquis.  He has also been on carvedilol.  He presented to the hospital with intermittent complete heart block.  A temp wire placed.  His carvedilol was held and his conduction improved.  Yesterday afternoon, he began having 2-1 AV block with intermittent atrial fibrillation/flutter.  Patient feels well lying in bed, but did have significant weakness, fatigue, shortness of breath with exertion..  Data: EKG(s) and pertinent labs, studies, etc were personally reviewed and interpreted by me:  EKG, telemetry, BMP, CBC Otherwise, I agree with data as outlined by the advanced practice provider.  Exam performed by me: Gen: No acute distress Neck: None JVD Cardiac: Cardiac, regular Lungs: Normal work of breathing Extremities: No edema  My Assessment and Plan: 1.  Second-degree AV block: Has had washout of carvedilol with continued heart block.  He does have significant conduction disease at baseline.  He Gomer [place] need pacemaker implantation as there is no other reversible cause noted.  Risks and benefits have been discussed.  The patient understands the risks and has agreed to the  procedure.  Explained risks, benefits, and alternatives to PPM implantation, including but not limited to bleeding, infection, pneumothorax, pericardial effusion, lead dislodgement, heart attack, stroke, or death.  Pt verbalized understanding and agrees to proceed.  2.  Paroxysmal atrial fibrillation/flutter: Holding anticoagulation for pacemaker implant.  3.  Coronary artery disease: Post CABG.  No ischemic symptoms.  Signed,  Augustino Savastano Gladis Inocencio, MD  06/18/2024 9:18 AM

## 2024-06-18 NOTE — Progress Notes (Signed)
 PHARMACY - ANTICOAGULATION CONSULT NOTE  Pharmacy Consult for Heparin   Indication: atrial fibrillation  Allergies  Allergen Reactions   Vibramycin  [Doxycycline ] Hives and Diarrhea   Penicillins Rash    Childhood reaction   Dynabac [Dirithromycin] Nausea And Vomiting and Other (See Comments)    Abdominal pain    Patient Measurements: Height: 5' 9 (175.3 cm) Weight: 117.1 kg (258 lb 2.5 oz) IBW/kg (Calculated) : 70.7 HEPARIN  DW (KG): 96.3  Vital Signs: Temp: 97.9 F (36.6 C) (09/23 0600) Temp Source: Oral (09/23 0600) BP: 155/53 (09/23 0600) Pulse Rate: 69 (09/23 0600)  Labs: Recent Labs    06/16/24 1646 06/16/24 1925 06/17/24 0916 06/17/24 1816 06/18/24 0447  HGB 10.8*  --  10.8*  --  11.6*  HCT 37.0*  --  36.3*  --  40.0  PLT 383  --  329  --  362  APTT  --   --  32 35 35  HEPARINUNFRC  --   --   --   --  0.40  CREATININE 1.87*  --  1.54*  --  1.49*  TROPONINIHS 19* 19*  --   --   --     Estimated Creatinine Clearance: 63.3 mL/min (A) (by C-G formula based on SCr of 1.49 mg/dL (H)).   Assessment: 53 yoM with hx recent CABG c/b POAF on apixaban admitted with syncope. TVP placed 9/21, pharmacy to dose IV heparin  while apixaban held.  aPTT remains subtherapeutic (35 sec) on infusion at 1900 units/hr. Heparin  level is 0.4 and not correlating with aPTT. No issues with line or bleeding reported per RN. CBC is stable.  Goal of Therapy:  Heparin  level 0.3-0.7 units/ml aPTT 66-103 seconds Monitor platelets by anticoagulation protocol: Yes   Plan:  Increase heparin  drip to 2200 units/hr Recheck aPTT in 6 hr Daily heparin  level, aPTT, CBC  Thank you for involving pharmacy in this patient's care.  Delon Sax, PharmD, BCPS Clinical Pharmacist 06/18/2024 6:37 AM

## 2024-06-18 NOTE — Plan of Care (Signed)
  Problem: Education: Goal: Knowledge of General Education information will improve Description: Including pain rating scale, medication(s)/side effects and non-pharmacologic comfort measures Outcome: Progressing   Problem: Health Behavior/Discharge Planning: Goal: Ability to manage health-related needs will improve Outcome: Progressing   Problem: Clinical Measurements: Goal: Ability to maintain clinical measurements within normal limits will improve Outcome: Progressing Goal: Will remain free from infection Outcome: Progressing Goal: Diagnostic test results will improve Outcome: Progressing Goal: Respiratory complications will improve Outcome: Progressing Goal: Cardiovascular complication will be avoided Outcome: Progressing   Problem: Activity: Goal: Risk for activity intolerance will decrease Outcome: Progressing   Problem: Nutrition: Goal: Adequate nutrition will be maintained Outcome: Progressing   Problem: Coping: Goal: Level of anxiety will decrease Outcome: Progressing   Problem: Elimination: Goal: Will not experience complications related to bowel motility Outcome: Progressing Goal: Will not experience complications related to urinary retention Outcome: Progressing   Problem: Pain Managment: Goal: General experience of comfort will improve and/or be controlled Outcome: Progressing   Problem: Safety: Goal: Ability to remain free from injury will improve Outcome: Progressing   Problem: Skin Integrity: Goal: Risk for impaired skin integrity will decrease Outcome: Progressing   Problem: Education: Goal: Ability to describe self-care measures that may prevent or decrease complications (Diabetes Survival Skills Education) will improve Outcome: Progressing Goal: Individualized Educational Video(s) Outcome: Progressing   Problem: Coping: Goal: Ability to adjust to condition or change in health will improve Outcome: Progressing   Problem: Fluid  Volume: Goal: Ability to maintain a balanced intake and output will improve Outcome: Progressing   Problem: Health Behavior/Discharge Planning: Goal: Ability to identify and utilize available resources and services will improve Outcome: Progressing Goal: Ability to manage health-related needs will improve Outcome: Progressing   Problem: Metabolic: Goal: Ability to maintain appropriate glucose levels will improve Outcome: Progressing   Problem: Nutritional: Goal: Maintenance of adequate nutrition will improve Outcome: Progressing Goal: Progress toward achieving an optimal weight will improve Outcome: Progressing   Problem: Skin Integrity: Goal: Risk for impaired skin integrity will decrease Outcome: Progressing   Problem: Tissue Perfusion: Goal: Adequacy of tissue perfusion will improve Outcome: Progressing   Problem: Education: Goal: Knowledge of condition and prescribed therapy will improve Outcome: Progressing   Problem: Cardiac: Goal: Will achieve and/or maintain adequate cardiac output Outcome: Progressing   Problem: Physical Regulation: Goal: Complications related to the disease process, condition or treatment will be avoided or minimized Outcome: Progressing

## 2024-06-18 NOTE — Progress Notes (Addendum)
 Advanced Heart Failure Rounding Note  HF Consulting Cardiologist: Dr. Rolan  Chief Complaint: Presyncope  Subjective:    2:1 AV block 40s-50s w/ intermittent pacing.  Toprol  and Diltiazem remain on hold.  EP planning for PPM this afternoon or tomorrow.   Feels fine. Struggling with bed rest.   Objective:   Weight Range: 117.1 kg Body mass index is 38.12 kg/m.   Vital Signs:   Temp:  [97.8 F (36.6 C)-98.4 F (36.9 C)] 98.4 F (36.9 C) (09/23 0700) Pulse Rate:  [44-85] 47 (09/23 0700) Resp:  [4-24] 17 (09/23 0700) BP: (119-167)/(46-115) 140/57 (09/23 0700) SpO2:  [76 %-98 %] 97 % (09/23 0700) Weight:  [117.1 kg] 117.1 kg (09/23 0500) Last BM Date :  (PTA)  Weight change: Filed Weights   06/16/24 1643 06/17/24 0500 06/18/24 0500  Weight: 114.8 kg 120 kg 117.1 kg    Intake/Output:   Intake/Output Summary (Last 24 hours) at 06/18/2024 0744 Last data filed at 06/18/2024 0700 Gross per 24 hour  Intake 570.43 ml  Output 4000 ml  Net -3429.57 ml      Physical Exam    General:  Well appearing. No resp difficulty HEENT: Normal Neck: Supple. JVP . Carotids 2+ bilat; no bruits. No lymphadenopathy or thyromegaly appreciated. Cor: PMI nondisplaced. Regular rate & rhythm. No rubs, gallops or murmurs. Lungs: Clear Abdomen: Soft, nontender, nondistended. No hepatosplenomegaly. No bruits or masses. Good bowel sounds. Extremities: No cyanosis, clubbing, rash, edema Neuro: Alert & orientedx3, cranial nerves grossly intact. moves all 4 extremities w/o difficulty. Affect pleasant   Telemetry   2:1 AV block, 40s-50s with intermittent pacing  Labs    CBC Recent Labs    06/16/24 1646 06/17/24 0916 06/18/24 0447  WBC 10.0 6.8 8.8  NEUTROABS 6.4  --   --   HGB 10.8* 10.8* 11.6*  HCT 37.0* 36.3* 40.0  MCV 76.8* 74.8* 75.5*  PLT 383 329 362   Basic Metabolic Panel Recent Labs    90/78/74 1646 06/17/24 0916 06/18/24 0447  NA 138 139 136  K 4.5 4.6 4.4   CL 101 103 101  CO2 25 27 22   GLUCOSE 140* 122* 146*  BUN 33* 24* 18  CREATININE 1.87* 1.54* 1.49*  CALCIUM 9.0 9.1 9.3  MG 2.1 2.1  --    Liver Function Tests Recent Labs    06/16/24 1646  AST 41  ALT 37  ALKPHOS 61  BILITOT 0.3  PROT 6.9  ALBUMIN 3.3*   No results for input(s): LIPASE, AMYLASE in the last 72 hours. Cardiac Enzymes No results for input(s): CKTOTAL, CKMB, CKMBINDEX, TROPONINI in the last 72 hours.  BNP: BNP (last 3 results) No results for input(s): BNP in the last 8760 hours.  ProBNP (last 3 results) No results for input(s): PROBNP in the last 8760 hours.   D-Dimer No results for input(s): DDIMER in the last 72 hours. Hemoglobin A1C Recent Labs    06/17/24 0916  HGBA1C 7.4*   Fasting Lipid Panel Recent Labs    06/17/24 0916  CHOL 97  HDL 43  LDLCALC 29  TRIG 123  CHOLHDL 2.3   Thyroid Function Tests Recent Labs    06/17/24 2220  TSH 7.367*    Other results:   Imaging    ECHOCARDIOGRAM COMPLETE Result Date: 06/17/2024    ECHOCARDIOGRAM REPORT   Patient Name:   Chase Harris Leventhal Date of Exam: 06/17/2024 Medical Rec #:  [id number]  Height:       69.0 in Accession #:    [id number]          Weight:       264.6 lb Date of Birth:  1960-10-20            BSA:          2.327 m Patient Age:    63 years            BP:           141/63 mmHg Patient Gender: M                   HR:           48 bpm. Exam Location:  Inpatient Procedure: 2D Echo and Intracardiac Opacification Agent (Both Spectral and Color            Flow Doppler were utilized during procedure). Indications:    Syncope, Complete heart block  History:        Patient has prior history of Echocardiogram examinations. CAD;                 Prior CABG.  Sonographer:    Charmaine Gaskins Referring Phys: [id number] GILLIAN HERO [facility]  Sonographer Comments: Technically difficult study due to poor echo windows. IMPRESSIONS  1. Left ventricular ejection fraction, by estimation, is  60 to 65%. The left ventricle has normal function. The left ventricle has no regional wall motion abnormalities. Left ventricular diastolic function could not be evaluated.  2. Right ventricular systolic function is normal. The right ventricular size is normal.  3. The mitral valve is normal in structure. No evidence of mitral valve regurgitation. No evidence of mitral stenosis.  4. The aortic valve is normal in structure. Aortic valve regurgitation is not visualized. No aortic stenosis is present.  5. The inferior vena cava is dilated in size with <50% respiratory variability, suggesting right atrial pressure of 15 mmHg. FINDINGS  Left Ventricle: Left ventricular ejection fraction, by estimation, is 60 to 65%. The left ventricle has normal function. The left ventricle has no regional wall motion abnormalities. Definity  contrast agent was given IV to delineate the left ventricular  endocardial borders. The left ventricular internal cavity size was normal in size. There is no left ventricular hypertrophy. Left ventricular diastolic function could not be evaluated due to nondiagnostic images. Left ventricular diastolic function could not be evaluated. Right Ventricle: The right ventricular size is normal. No increase in right ventricular wall thickness. Right ventricular systolic function is normal. Left Atrium: Left atrial size was normal in size. Right Atrium: Right atrial size was normal in size. Pericardium: There is no evidence of pericardial effusion. Mitral Valve: The mitral valve is normal in structure. No evidence of mitral valve regurgitation. No evidence of mitral valve stenosis. Tricuspid Valve: The tricuspid valve is normal in structure. Tricuspid valve regurgitation is not demonstrated. No evidence of tricuspid stenosis. Aortic Valve: The aortic valve is normal in structure. Aortic valve regurgitation is not visualized. No aortic stenosis is present. Pulmonic Valve: The pulmonic valve was normal in  structure. Pulmonic valve regurgitation is not visualized. No evidence of pulmonic stenosis. Aorta: The aortic root is normal in size and structure. Venous: The inferior vena cava is dilated in size with less than 50% respiratory variability, suggesting right atrial pressure of 15 mmHg. IAS/Shunts: No atrial level shunt detected by color flow Doppler.  LEFT VENTRICLE PLAX 2D LVIDd:  4.69 cm LVIDs:         2.72 cm LV PW:         1.25 cm LV IVS:        1.34 cm LVOT diam:     2.19 cm LVOT Area:     3.77 cm  LV Volumes (MOD) LV vol d, MOD A2C: 90.1 ml LV vol d, MOD A4C: 136.0 ml LV vol s, MOD A2C: 26.5 ml LV vol s, MOD A4C: 30.1 ml LV SV MOD A2C:     63.6 ml LV SV MOD A4C:     136.0 ml LV SV MOD BP:      87.4 ml RIGHT VENTRICLE RV S prime:     12.70 cm/s LEFT ATRIUM             Index LA diam:        4.66 cm 2.00 cm/m LA Vol (A2C):   76.0 ml 32.66 ml/m LA Vol (A4C):   81.2 ml 34.90 ml/m LA Biplane Vol: 86.8 ml 37.30 ml/m   AORTA Ao Asc diam: 3.30 cm  SHUNTS Systemic Diam: 2.19 cm Aditya Sabharwal Electronically signed by Ria Commander Signature Date/Time: 06/17/2024/4:54:20 PM    Final      Medications:     Scheduled Medications:  aspirin  EC  81 mg Oral Daily   Chlorhexidine  Gluconate Cloth  6 each Topical Daily   insulin  aspart  0-15 Units Subcutaneous TID WC   nystatin   5 mL Oral QID   pantoprazole   40 mg Oral Daily   sodium chloride  flush  3 mL Intravenous Q12H   spironolactone   25 mg Oral Daily    Infusions:    PRN Medications: bisacodyl , hydrALAZINE , mouth rinse    Patient Profile   63 y.o. male with history of CAD s/p CABG X 4 (LIMA to diagonal, SVG to LAD, SVG to PDA, left radial graft to PL of RCA) at OSH 05/10/24, CKD III, DM II, post-op AF post CABG, RA, OSA on CPAP, TIA and CVA in 2018.  Presented with presyncope and CHB s/p TVP.  Assessment/Plan   CHB - Presented with presyncope. Bradycardic w/ episodes of 2:1 AV block and CHB. No significant improvement with  dopamine . S/p TVP. - He is now off amiodarone, diltiazem and metoprolol , took all three medications yesterday AM.  - EP following. Remains in 2:1 AV block despite washout. Will need PPM, likely today or tomorrow. - Echo this admit with LVEF 60-65%, RV okay   2. CAD s/p 4v CABG - CABG was done on 05/10/2024 after a diagnosis of multivessel disease on LHC that was done in the setting of syncopal episodes.  - Query if he was already having intermittent high grade AV block at that time (began having presyncopal episodes around July).  - Continue ASA 81 mg daily  - Not on statin therapy given myalgia. On Repatha at home.   3. Post CABG AF - Hold home apixaban for now (last dose the AM of 9/21) - Heparin  gtt - Hold amiodarone and AV nodal blocking agents as above    4. T2DM - SSI  5. HTN w/ history of orthostatic hypotension - Continue spiro 25 mg daily  6. AKI on CKD IIIa -Follows with [facility] -Scr baseline recently ~ 1.3-1.5, 1.87 on admit. Now improved to baseline. Suspect periods of hypotension in setting of CHB. -Restart jardiance prior to discharge  CRITICAL CARE Performed by: COLLETTA SHAVER N   Total critical care time: 13 minutes  Critical  care time was exclusive of separately billable procedures and treating other patients.  Critical care was necessary to treat or prevent imminent or life-threatening deterioration.  Critical care was time spent personally by me on the following activities: development of treatment plan with patient and/or surrogate as well as nursing, discussions with consultants, evaluation of patient's response to treatment, examination of patient, obtaining history from patient or surrogate, ordering and performing treatments and interventions, ordering and review of laboratory studies, ordering and review of radiographic studies, pulse oximetry and re-evaluation of patient's condition.   Length of Stay: 2  FINCH, MANUELITA SAILOR, PA-C  06/18/2024, 7:44  AM  Advanced Heart Failure Team Pager [phone] (M-F; 7a - 5p)  Please contact [facility] for night-coverage after hours (5p -7a ) and weekends on [email or web address]  Patient seen with PA, agree with the above note.    He remains in 2:1 AVB with rate in 40s, BP stable.  No complaints.   Echo with EF 60-65%, normal RV, dilated IVC.   General: NAD Neck: Thick, JVP difficult, no thyromegaly or thyroid nodule.  Lungs: Clear to auscultation bilaterally with normal respiratory effort. CV: Nondisplaced PMI.  Heart regular S1/S2, no S3/S4, no murmur.  No peripheral edema.    Abdomen: Soft, nontender, no hepatosplenomegaly, no distention.  Skin: Intact without lesions or rashes.  Neurologic: Alert and oriented x 3.  Psych: Normal affect. Extremities: No clubbing or cyanosis.  HEENT: Normal.   EP to place PPM today or tomorrow with persistent 2:1 block and h/o syncope/presyncope.  The TTVP is checked and is working appropriately, set for 40 bpm backup.  He had some pacing overnight.   Dilated IVC on echo, will give Lasix  40 mg IV x 1 today.   CRITICAL CARE Performed by: Ezra Shuck  Total critical care time: 35 minutes  Critical care time was exclusive of separately billable procedures and treating other patients.  Critical care was necessary to treat or prevent imminent or life-threatening deterioration.  Critical care was time spent personally by me on the following activities: development of treatment plan with patient and/or surrogate as well as nursing, discussions with consultants, evaluation of patient's response to treatment, examination of patient, obtaining history from patient or surrogate, ordering and performing treatments and interventions, ordering and review of laboratory studies, ordering and review of radiographic studies, pulse oximetry and re-evaluation of patient's condition.  Ezra Shuck 06/18/2024 9:01 AM

## 2024-06-19 ENCOUNTER — Other Ambulatory Visit (HOSPITAL_COMMUNITY): Payer: Self-pay

## 2024-06-19 ENCOUNTER — Encounter (HOSPITAL_COMMUNITY): Payer: Self-pay | Admitting: Cardiology

## 2024-06-19 ENCOUNTER — Inpatient Hospital Stay (HOSPITAL_COMMUNITY)

## 2024-06-19 DIAGNOSIS — I441 Atrioventricular block, second degree: Secondary | ICD-10-CM | POA: Diagnosis not present

## 2024-06-19 LAB — CBC
HCT: 38.4 % — ABNORMAL LOW (ref 39.0–52.0)
Hemoglobin: 11.4 g/dL — ABNORMAL LOW (ref 13.0–17.0)
MCH: 22.2 pg — ABNORMAL LOW (ref 26.0–34.0)
MCHC: 29.7 g/dL — ABNORMAL LOW (ref 30.0–36.0)
MCV: 74.7 fL — ABNORMAL LOW (ref 80.0–100.0)
Platelets: 325 K/uL (ref 150–400)
RBC: 5.14 MIL/uL (ref 4.22–5.81)
RDW: 17.4 % — ABNORMAL HIGH (ref 11.5–15.5)
WBC: 7.6 K/uL (ref 4.0–10.5)
nRBC: 0 % (ref 0.0–0.2)

## 2024-06-19 LAB — BASIC METABOLIC PANEL WITH GFR
Anion gap: 11 (ref 5–15)
BUN: 21 mg/dL (ref 8–23)
CO2: 23 mmol/L (ref 22–32)
Calcium: 8.9 mg/dL (ref 8.9–10.3)
Chloride: 102 mmol/L (ref 98–111)
Creatinine, Ser: 1.51 mg/dL — ABNORMAL HIGH (ref 0.61–1.24)
GFR, Estimated: 51 mL/min — ABNORMAL LOW (ref >60)
Glucose, Bld: 142 mg/dL — ABNORMAL HIGH (ref 70–99)
Potassium: 4.1 mmol/L (ref 3.5–5.1)
Sodium: 136 mmol/L (ref 135–145)

## 2024-06-19 LAB — GLUCOSE, CAPILLARY
Glucose-Capillary: 135 mg/dL — ABNORMAL HIGH (ref 70–99)
Glucose-Capillary: 146 mg/dL — ABNORMAL HIGH (ref 70–99)
Glucose-Capillary: 137 mg/dL — ABNORMAL HIGH (ref 70–99)

## 2024-06-19 MED ORDER — METOPROLOL TARTRATE 50 MG PO TABS
50.0000 mg | ORAL_TABLET | Freq: Once | ORAL | Status: AC
  Administered 2024-06-19: 50 mg via ORAL
  Filled 2024-06-19: qty 1

## 2024-06-19 MED FILL — Midazolam HCl Inj 2 MG/2ML (Base Equivalent): INTRAMUSCULAR | Qty: 2 | Status: AC

## 2024-06-19 NOTE — Progress Notes (Addendum)
 Advanced Heart Failure Rounding Note  HF Consulting Cardiologist: Dr. Rolan  Chief Complaint: Presyncope  Subjective:    S/p dual chamber PPM implanted yesterday. Device pocket and CXR ok.   Good response to IV Lasix  yesterday, 3L in UOP. Wt down 9 lb. Scr stable 1.5, K 4.1   No CP or dyspnea. Feels well and ready to go home.   Objective:   Weight Range: 113.1 kg Body mass index is 36.82 kg/m.   Vital Signs:   Temp:  [97.6 F (36.4 C)-98.2 F (36.8 C)] 98.2 F (36.8 C) (09/24 0400) Pulse Rate:  [47-156] 101 (09/24 0830) Resp:  [13-25] 18 (09/24 0830) BP: (102-184)/(47-121) 141/69 (09/24 0800) SpO2:  [82 %-100 %] 97 % (09/24 0830) Weight:  [113.1 kg] 113.1 kg (09/24 0500) Last BM Date : 06/18/24  Weight change: Filed Weights   06/17/24 0500 06/18/24 0500 06/19/24 0500  Weight: 120 kg 117.1 kg 113.1 kg    Intake/Output:   Intake/Output Summary (Last 24 hours) at 06/19/2024 9093 Last data filed at 06/19/2024 0500 Gross per 24 hour  Intake 593.72 ml  Output 2825 ml  Net -2231.28 ml      Physical Exam   GENERAL: obese, NAD Lungs- clear  CARDIAC:  thick neck, JVD not elevated         Normal rate with regular rhythm. No MRG. No LEE  ABDOMEN: obese, soft, non-tender, non-distended.  EXTREMITIES: Warm and well perfused.  NEUROLOGIC: No obvious FND    Telemetry   V paced, 102 bpm, personally reviewed   Labs    CBC Recent Labs    06/16/24 1646 06/17/24 0916 06/18/24 1748 06/19/24 0459  WBC 10.0   < > 7.3 7.6  NEUTROABS 6.4  --   --   --   HGB 10.8*   < > 11.9* 11.4*  HCT 37.0*   < > 41.2 38.4*  MCV 76.8*   < > 75.3* 74.7*  PLT 383   < > 387 325   < > = values in this interval not displayed.   Basic Metabolic Panel Recent Labs    90/77/74 0916 06/18/24 0446 06/18/24 0447 06/18/24 1748 06/19/24 0459  NA 139  --    < > 136 136  K 4.6  --    < > 4.2 4.1  CL 103  --    < > 100 102  CO2 27  --    < > 24 23  GLUCOSE 122*  --    < >  116* 142*  BUN 24*  --    < > 20 21  CREATININE 1.54*  --    < > 1.56* 1.51*  CALCIUM 9.1  --    < > 9.2 8.9  MG 2.1 1.9  --   --   --    < > = values in this interval not displayed.   Liver Function Tests Recent Labs    06/16/24 1646  AST 41  ALT 37  ALKPHOS 61  BILITOT 0.3  PROT 6.9  ALBUMIN 3.3*   No results for input(s): LIPASE, AMYLASE in the last 72 hours. Cardiac Enzymes No results for input(s): CKTOTAL, CKMB, CKMBINDEX, TROPONINI in the last 72 hours.  BNP: BNP (last 3 results) No results for input(s): BNP in the last 8760 hours.  ProBNP (last 3 results) No results for input(s): PROBNP in the last 8760 hours.   D-Dimer No results for input(s): DDIMER in the last 72 hours. Hemoglobin A1C  Recent Labs    06/17/24 0916  HGBA1C 7.4*   Fasting Lipid Panel Recent Labs    06/17/24 0916  CHOL 97  HDL 43  LDLCALC 29  TRIG 123  CHOLHDL 2.3   Thyroid Function Tests Recent Labs    06/17/24 2220  TSH 7.367*    Other results:   Imaging    DG Chest 2 View Result Date: 06/19/2024 EXAM: 2 VIEW(S) XRAY OF THE CHEST 06/19/2024 05:36:19 AM COMPARISON: 06/16/2024 CLINICAL HISTORY: Cardiac device in situ, other [phone]. S/P pacemaker 06-18-24 FINDINGS: LINES, TUBES AND DEVICES: New dual lead left subclavian pacemaker with leads projecting over right atrium and ventricle. Right IJ sheath in place. Right IJ temporary pacing wire removed. LUNGS AND PLEURA: No focal pulmonary opacity. No pulmonary edema. No pleural effusion. No pneumothorax status post pacer placement. HEART AND MEDIASTINUM: Cardiomegaly. Post median sternotomy and CABG. Intact sternotomy wires. BONES AND SOFT TISSUES: No acute osseous abnormality. IMPRESSION: 1. New dual-lead left subclavian pacemaker in appropriate position without pneumothorax status post placement. 2. Cardiomegaly. 3. Several stable chronic and incidental postoperative findings are noted, including prior median  sternotomy with intact CABG sternotomy wires; see report body for details. Electronically signed by: Waddell Calk MD 06/19/2024 06:36 AM EDT RP Workstation: [id number]   EP PPM/ICD IMPLANT Result Date: 06/18/2024 SURGEON:  Soyla Norton, MD   PREPROCEDURE DIAGNOSIS:  second degree AV block   POSTPROCEDURE DIAGNOSIS:  second degree AV block    PROCEDURES:  1. Pacemaker implantation.   INTRODUCTION:  Chase Harris is a 63 y.o. male with a history of bradycardia who presents today for pacemaker implantation.  The patient reports intermittent episodes of dizziness over the past few months.  No reversible causes have been identified.  The patient therefore presents today for pacemaker implantation.   DESCRIPTION OF PROCEDURE:  Informed written consent was obtained, and  the patient was brought to the electrophysiology lab in a fasting state.  The patient required no sedation for the procedure today.  The patients left chest was prepped and draped in the usual sterile fashion by the EP lab staff. The skin overlying the left deltopectoral region was infiltrated with lidocaine  for local analgesia.  A 4-cm incision was made over the left deltopectoral region.  A left subcutaneous pacemaker pocket was fashioned using a combination of sharp and blunt dissection. Electrocautery was required to assure hemostasis.  RA/RV Lead Placement: The left axillary vein was therefore cannulated.  Through the left axillary vein, a Abbott Ultipace 1231-52  (serial number  [id number]) right atrial lead and an Abbott Ultipace 1231-65 (serial number  [id number]) right ventricular lead were advanced with fluoroscopic visualization into the right atrial appendage and left bundle area positions respectively.  Initial atrial lead P- waves measured 5 mV with impedance of 480 ohms and a threshold of 1.25 V at 0.5 msec.  Right ventricular lead R-waves measured 6.9 mV with an impedance of 630 ohms and a threshold of 1.25 V at 0.8 msec.  Both  leads were secured to the pectoralis fascia using #2-0 silk over the suture sleeves. Device Placement:  The leads were then connected to an Abbott Assurity [id number]  (serial number  [id number] ) pacemaker.  The pocket was irrigated with copious gentamicin  solution.  The pacemaker was then placed into the pocket.  The pocket was then closed in 3 layers with 2.0 and 3.0 V-Loc suture for the subcutaneous layers and 3.0 Vicryl suture for the subcuticular layers.  Steri-  Strips and a  sterile dressing were then applied. EBL<37ml.  There were no early apparent complications.   CONCLUSIONS:  1. Successful implantation of a Abbott Assurity [id number] dual-chamber pacemaker for symptomatic bradycardia  2. No early apparent complications.   Will Inocencio, MD 06/18/2024 5:12 PM    Medications:     Scheduled Medications:  Chlorhexidine  Gluconate Cloth  6 each Topical Daily   insulin  aspart  0-15 Units Subcutaneous TID WC   nystatin   5 mL Oral QID   pantoprazole   40 mg Oral Daily   sodium chloride  flush  3 mL Intravenous Q12H   sodium chloride  flush  3 mL Intravenous Q12H   spironolactone   25 mg Oral Daily    Infusions:   ceFAZolin  (ANCEF ) IV Stopped (06/19/24 0357)     PRN Medications: acetaminophen , bisacodyl , hydrALAZINE , ondansetron  (ZOFRAN ) IV, mouth rinse    Patient Profile   63 y.o. male with history of CAD s/p CABG X 4 (LIMA to diagonal, SVG to LAD, SVG to PDA, left radial graft to PL of RCA) at OSH 05/10/24, CKD III, DM II, post-op AF post CABG, RA, OSA on CPAP, TIA and CVA in 2018.  Presented with presyncope and CHB s/p TVP.  Assessment/Plan   CHB - Presented with presyncope. Bradycardic w/ episodes of 2:1 AV block and CHB. No significant improvement with dopamine . S/p TVP.  - s/p dual chamber PPM yesterday  - Echo this admit with LVEF 60-65%, RV okay   2. CAD s/p 4v CABG - CABG was done on 05/10/2024 after a diagnosis of multivessel disease on LHC that was done in the setting of syncopal  episodes.  - Query if he was already having intermittent high grade AV block at that time (began having presyncopal episodes around July).  - Continue ASA 81 mg daily  - Not on statin therapy given myalgia. On Repatha at home.   3. Post CABG AF - Hold home apixaban for now s/p PPM yesterday, resume once ok w/ EP team  - d/w EP, plan to hold amiodarone for now and monitor burden on device. May need future AF ablation    4. T2DM - SSI  5. HTN w/ history of orthostatic hypotension - Continue spiro 25 mg daily  6. AKI on CKD IIIa -Follows with [facility] -Scr baseline recently ~ 1.3-1.5, 1.87 on admit. Now improved to baseline. Suspect periods of hypotension in setting of CHB. -Restart jardiance prior to discharge  Agree w/ EP, pt stable for d/c home today. Can f/u w/ EP and gen cards teams  Length of Stay: 3  Chase Harris  06/19/2024, 9:06 AM  Advanced Heart Failure Team Pager [phone] (M-F; 7a - 5p)  Please contact [facility] for night-coverage after hours (5p -7a ) and weekends on [email or web address]  Patient seen with PA, I formulated the plan and agree with the above note.   PPM with LB lead placed yesterday.  Feels great this morning.  Good diuresis with a dose of IV Lasix  yesterday.   General: NAD Neck: Thick. No JVD, no thyromegaly or thyroid nodule.  Lungs: Clear to auscultation bilaterally with normal respiratory effort. CV: Nondisplaced PMI.  Heart regular S1/S2, no S3/S4, 2/6 early SEM RUSB.  No peripheral edema.   Abdomen: Soft, nontender, no hepatosplenomegaly, no distention.  Skin: Intact without lesions or rashes.  Neurologic: Alert and oriented x 3.  Psych: Normal affect. Extremities: No clubbing or cyanosis.  HEENT: Normal.   Now s/p PPM with LB lead for symptomatic AV block.  He  is doing well post-procedure.   He will hold apixaban to 9/28 but will resume ASA 81 (recent CABG).   Volume status looks ok, had a dose of IV Lasix  yesterday.  Restart Farxiga  10 mg daily.   Home today.   Ezra Shuck 06/19/2024 9:43 AM

## 2024-06-19 NOTE — Discharge Summary (Cosign Needed Addendum)
 ELECTROPHYSIOLOGY PROCEDURE DISCHARGE SUMMARY    Patient ID: Chase Harris,  MRN: [id number], DOB/AGE: [birth year] 63 y.o.  Admit date: 06/16/2024 Discharge date: 06/19/2024  Primary Care Physician: Alla Amis, MD  Primary Cardiologist: None  Electrophysiologist: Dr. 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  Implantation of a Abbott Dual Chamber PPM on 06/18/2024 by Dr. Inocencio. The patient received a Abbott Assurity [id number] with a Abbott Ultipace 1231-52 right atrial lead and a Abbott Ultipace 1231-65 right ventricular lead.  There were no immediate post procedure complications.   2.  CXR on 06/19/2024 demonstrated no pneumothorax status post device implantation.       Brief HPI: Chase Harris is a 63 y.o. male was admitted for intermittent CHB and electrophysiology team asked to see for consideration of PPM implantation.  Past medical history includes recent CABG as above and HTN.  The patient has had AV block that persisted despite AV nodal agent wash out. Risks, benefits, and alternatives to PPM implantation were reviewed with the patient who wished to proceed.   Hospital Course:  The patient was admitted and underwent implantation of a Abbott dual chamber PPM with details as outlined above.  He was monitored on telemetry overnight which demonstrated NSR.  Left chest was without hematoma or ecchymosis.  The device was interrogated and found to be functioning normally.  CXR was obtained and demonstrated no pneumothorax status post device implantation.  Wound care, arm mobility, and  restrictions were reviewed with the patient.  The patient was examined and considered stable for discharge to home.    Anticoagulation resumption This patient should resume their Eliquis on Sunday June 23, 2024    Physical Exam: Vitals:   06/19/24 0700 06/19/24 0730 06/19/24 0800 06/19/24 0830  BP: 130/74  (!) 141/69   Pulse: 95 97 100 (!) 101  Resp: 17 (!) 21 (!) 24 18  Temp:      TempSrc:      SpO2: 99% 96% 99% 97%  Weight:      Height:        GEN- NAD. A&O x 3.  HEENT: Normocephalic, atraumatic Lungs- CTAB, Normal effort.  Heart- RRR, No M/G/R.  GI- Soft, NT, ND.  Extremities- No clubbing, cyanosis, or edema;  Skin- warm and dry, no rash or lesion, left chest without hematoma/ecchymosis  Discharge Medications:  Allergies as of 06/19/2024       Reactions   Vibramycin  [doxycycline ] Hives, Diarrhea   Penicillins Rash   Childhood reaction   Dynabac [dirithromycin] Nausea And Vomiting, Other (See Comments)   Abdominal pain        Medication List     PAUSE taking these medications    Eliquis 5 MG Tabs tablet Wait to take this until: June 23, 2024 Evening Generic drug: apixaban Take 5 mg by mouth 2 (two) times daily.       STOP taking these medications    amiodarone 200 MG tablet Commonly known as: PACERONE   diltiazem 120 MG 24 hr capsule Commonly known as: DILACOR XR       TAKE these medications  acetaminophen  500 MG tablet Commonly known as: TYLENOL  Take 1,500 mg by mouth 2 (two) times daily as needed for headache or fever (pain).   allopurinol 300 MG tablet Commonly known as: ZYLOPRIM Take 300 mg by mouth daily.   aspirin  EC 81 MG tablet Take 81 mg by mouth daily.   Enbrel 50 MG/ML injection Generic drug: etanercept Inject 50 mg into the skin every Wednesday.   Farxiga 10 MG Tabs tablet Generic drug: dapagliflozin propanediol Take 10 mg by mouth daily.   fenofibrate 160 MG tablet Take 160 mg by mouth daily.   Fiasp   FlexTouch 100 UNIT/ML FlexTouch Pen Generic drug: insulin  aspart Inject 45 Units into the skin with breakfast, with lunch, and with evening meal.   fluticasone 50 MCG/ACT nasal spray Commonly known as: FLONASE Place 2 sprays into the nose daily as needed for allergies or rhinitis.   furosemide  20 MG tablet Commonly known as: LASIX  Take 20 mg by mouth daily.   gabapentin  600 MG tablet Commonly known as: NEURONTIN  TAKE ONE TABLET (600 MG TOTAL) BY MOUTH THREE TIMES DAILY.   hydroxychloroquine 200 MG tablet Commonly known as: PLAQUENIL Take 200 mg by mouth 2 (two) times daily.   imipramine 50 MG tablet Commonly known as: TOFRANIL Take 50 mg by mouth at bedtime.   loratadine 10 MG tablet Commonly known as: CLARITIN Take 10 mg by mouth daily.   metFORMIN 1000 MG tablet Commonly known as: GLUCOPHAGE Take 1,000 mg by mouth 2 (two) times a day.   metoprolol  tartrate 50 MG tablet Commonly known as: LOPRESSOR  Take 50 mg by mouth 2 (two) times daily.   Multivitamin Men 50+ Tabs Take 1 tablet by mouth daily.   nystatin  100000 UNIT/ML suspension Commonly known as: MYCOSTATIN  Take 4 mLs by mouth 4 (four) times daily. 10 day course   omeprazole 40 MG capsule Commonly known as: PRILOSEC Take 40 mg by mouth daily.   oxyCODONE  5 MG immediate release tablet Commonly known as: Oxy IR/ROXICODONE  Take 5 mg by mouth every 4 (four) hours as needed for severe pain (pain score 7-10).   predniSONE 2.5 MG tablet Commonly known as: DELTASONE Take 2.5 mg by mouth daily with breakfast.   Repatha 140 MG/ML Sosy Generic drug: Evolocumab Inject 140 mg into the skin See admin instructions. Inject 1mL (140mg ) into the skin every other Thursday.   spironolactone  25 MG tablet Commonly known as: ALDACTONE  Take 25 mg by mouth daily.   Tresiba FlexTouch 100 UNIT/ML FlexTouch Pen Generic drug: insulin  degludec Inject 140 Units into the skin at bedtime.        Disposition:  Home with  usual follow up as in AVS   Duration of Discharge Encounter:  APP time: 25 minutes  Signed, Ozell Prentice Passey, PA-C  06/19/2024 9:43 AM  I have seen and examined this patient with Jodie Passey.  Agree with above, note added to reflect my findings.  On exam, RRR, no murmurs, lungs clear.  She is now status post Abbott pacemaker for second degree AV block.  Device functioning appropriately.  Sensing, threshold, impedance within normal limits as expected post implant.  Chest x-ray and interrogation without issue.  Plan for discharge today with follow-up in device clinic.  Duration of Discharge Encounter:  MD time: 20 minutes  Shadrack Brummitt M. Artin Mceuen MD 06/20/2024 7:58 AM

## 2024-06-19 NOTE — Plan of Care (Signed)

## 2024-06-19 NOTE — Discharge Instructions (Signed)
 After Your Pacemaker   You have a Abbott Pacemaker  If you have a Medtronic or Biotronik device, plug in your home monitor once you get home, and no manual interaction is required.   If you have an Abbott or [employer] device, plug your home monitor once you get home, sit near the device, and press the large activation button. Sit nearby until the process is complete, usually notated by lights on the monitor.   If you were set up for monitoring using an app on your phone, make sure the app remains open in the background and the Bluetooth remains on.  ACTIVITY Do not lift your arm above shoulder height for 1 week after your procedure. After 7 days, you may progress as below.  You should remove your sling 24 hours after your procedure, unless otherwise instructed by your provider.     Wednesday June 26, 2024  Thursday June 27, 2024 Friday June 28, 2024 Saturday June 29, 2024   Do not lift, push, pull, or carry anything over 10 pounds with the affected arm until 6 weeks (Wednesday July 31, 2024 ) after your procedure.   You may drive AFTER your wound check, unless you have been told otherwise by your provider.   Ask your healthcare provider when you can go back to work   INCISION/Dressing Resume ASA and Eliquis on Sunday evening, 06/23/2024  If large square, outer bandage is left in place, this can be removed after 24 hours from your procedure. Do not remove steri-strips or glue as below.   If a PRESSURE DRESSING (a bulky dressing that usually goes up over your shoulder) was applied or left in place, please follow instructions given by your provider on when to return to have this removed.   Monitor your Pacemaker site for redness, swelling, and drainage. Call the device clinic at [phone] if you experience these symptoms or fever/chills.  If your incision is sealed with Steri-strips or staples, you may shower 7 days after your procedure or when told by your  provider. Do not remove the steri-strips or let the shower hit directly on your site. You may wash around your site with soap and water.    If you were discharged in a sling, please do not wear this during the day more than 48 hours after your surgery unless otherwise instructed. This may increase the risk of stiffness and soreness in your shoulder.   Avoid lotions, ointments, or perfumes over your incision until it is well-healed.  You may use a hot tub or a pool AFTER your wound check appointment if the incision is completely closed.  Pacemaker Alerts:  Some alerts are vibratory and others beep. These are NOT emergencies. Please call our office to let us  know. If this occurs at night or on weekends, it can wait until the next business day. Send a remote transmission.  If your device is capable of reading fluid status (for heart failure), you will be offered monthly monitoring to review this with you.   DEVICE MANAGEMENT Remote monitoring is used to monitor your pacemaker from home. This monitoring is scheduled every 91 days by our office. It allows us  to keep an eye on the functioning of your device to ensure it is working properly. You will routinely see your Electrophysiologist annually (more often if necessary).  This will appear as a REMOTE check on your MyChart schedule. These are automatic and there is nothing for you to manually do unless otherwise instructed.  You should receive your ID card for your new device in 4-8 weeks. Keep this card with you at all times once received. Consider wearing a medical alert bracelet or necklace.  Your Pacemaker may be MRI compatible. This will be discussed at your next office visit/wound check.  You should avoid contact with strong electric or magnetic fields.   Do not use amateur (ham) radio equipment or electric (arc) welding torches. MP3 player headphones with magnets should not be used. Some devices are safe to use if held at least 12 inches (30 cm)  from your Pacemaker. These include power tools, lawn mowers, and speakers. If you are unsure if something is safe to use, ask your health care provider.  When using your cell phone, hold it to the ear that is on the opposite side from the Pacemaker. Do not leave your cell phone in a pocket over the Pacemaker.  You may safely use electric blankets, heating pads, computers, and microwave ovens.  Call the office right away if: You have chest pain. You feel more short of breath than you have felt before. You feel more light-headed than you have felt before. Your incision starts to open up.  This information is not intended to replace advice given to you by your health care provider. Make sure you discuss any questions you have with your health care provider.

## 2024-06-19 NOTE — Plan of Care (Signed)
  Problem: Education: Goal: Knowledge of General Education information will improve Description: Including pain rating scale, medication(s)/side effects and non-pharmacologic comfort measures Outcome: Progressing   Problem: Health Behavior/Discharge Planning: Goal: Ability to manage health-related needs will improve Outcome: Progressing   Problem: Clinical Measurements: Goal: Ability to maintain clinical measurements within normal limits will improve Outcome: Progressing Goal: Will remain free from infection Outcome: Progressing Goal: Diagnostic test results will improve Outcome: Progressing Goal: Respiratory complications will improve Outcome: Progressing Goal: Cardiovascular complication will be avoided Outcome: Progressing   Problem: Activity: Goal: Risk for activity intolerance will decrease Outcome: Progressing   Problem: Nutrition: Goal: Adequate nutrition will be maintained Outcome: Progressing   Problem: Coping: Goal: Level of anxiety will decrease Outcome: Progressing   Problem: Elimination: Goal: Will not experience complications related to bowel motility Outcome: Progressing Goal: Will not experience complications related to urinary retention Outcome: Progressing   Problem: Pain Managment: Goal: General experience of comfort will improve and/or be controlled Outcome: Progressing   Problem: Safety: Goal: Ability to remain free from injury will improve Outcome: Progressing   Problem: Skin Integrity: Goal: Risk for impaired skin integrity will decrease Outcome: Progressing   Problem: Education: Goal: Ability to describe self-care measures that may prevent or decrease complications (Diabetes Survival Skills Education) will improve Outcome: Progressing Goal: Individualized Educational Video(s) Outcome: Progressing   Problem: Coping: Goal: Ability to adjust to condition or change in health will improve Outcome: Progressing   Problem: Fluid  Volume: Goal: Ability to maintain a balanced intake and output will improve Outcome: Progressing   Problem: Health Behavior/Discharge Planning: Goal: Ability to identify and utilize available resources and services will improve Outcome: Progressing Goal: Ability to manage health-related needs will improve Outcome: Progressing   Problem: Metabolic: Goal: Ability to maintain appropriate glucose levels will improve Outcome: Progressing   Problem: Nutritional: Goal: Maintenance of adequate nutrition will improve Outcome: Progressing Goal: Progress toward achieving an optimal weight will improve Outcome: Progressing   Problem: Skin Integrity: Goal: Risk for impaired skin integrity will decrease Outcome: Progressing   Problem: Tissue Perfusion: Goal: Adequacy of tissue perfusion will improve Outcome: Progressing   Problem: Education: Goal: Knowledge of condition and prescribed therapy will improve Outcome: Progressing   Problem: Cardiac: Goal: Will achieve and/or maintain adequate cardiac output Outcome: Progressing   Problem: Physical Regulation: Goal: Complications related to the disease process, condition or treatment will be avoided or minimized Outcome: Progressing   Problem: Education: Goal: Knowledge of cardiac device and self-care will improve Outcome: Progressing Goal: Ability to safely manage health related needs after discharge will improve Outcome: Progressing Goal: Individualized Educational Video(s) Outcome: Progressing   Problem: Cardiac: Goal: Ability to achieve and maintain adequate cardiopulmonary perfusion will improve Outcome: Progressing

## 2024-06-19 NOTE — TOC Transition Note (Signed)
 Transition of Care [facility]) - Discharge Note   Patient Details  Name: Chase Harris MRN: [id number] Date of Birth: 14-Sep-1961  Transition of Care [facility] CM/SW Contact:  Sudie Erminio Deems, RN Phone Number: 06/19/2024, 10:06 AM   Clinical Narrative: Patient will discharge home today with home health RN services. Patient was previously active with [facility]. Orders to follow for disposition needs. No further Inpatient Case Manager needs identified.   Final next level of care: Home w Home Health Services Barriers to Discharge: No Barriers Identified   Patient Goals and CMS Choice Patient states their goals for this hospitalization and ongoing recovery are:: Plans to return home with spouse.  Discharge Plan and Services Additional resources added to the After Visit Summary for   In-house Referral: NA Discharge Planning Services: CM Consult Post Acute Care Choice: Home Health            DME Agency: NA  Social Drivers of Health (SDOH) Interventions SDOH Screenings   Food Insecurity: No Food Insecurity (06/18/2024)  Housing: Low Risk  (06/18/2024)  Transportation Needs: No Transportation Needs (06/18/2024)  Utilities: Not At Risk (06/18/2024)  Financial Resource Strain: Low Risk (05/08/2024)   Received from [facility]  Physical Activity: Insufficiently Active (05/08/2024)   Received from [facility]  Social Connections: Moderately Integrated (05/08/2024)   Received from [facility]  Stress: Patient Declined (05/08/2024)   Received from [facility]  Tobacco Use: Medium Risk (06/16/2024)  Health Literacy: Adequate Health Literacy (05/06/2024)   Received from [facility]   Readmission Risk Interventions     No data to display

## 2024-06-21 ENCOUNTER — Telehealth: Payer: Self-pay | Admitting: Cardiology

## 2024-06-21 NOTE — Telephone Encounter (Signed)
 Follow-up after same day discharge: Implant date: 06/18/24 MD: Soyla Norton, MD Device: Abbott Dual Chamber Pacemaker Location: Left Chest   Wound check visit: Wednesday 07/03/24 at 2:40 pm 90 day MD follow-up: Wednesday 09/25/24 at 3:30 pm with Dr. Norton  Remote Transmission received:06/20/24  Dressing/sling removed: Yes  Confirm OAC restart on: Currently on ASA 81 mg every day. Will restart Eliquis 5 mg BID on Sunday 06/23/24.   Please continue to monitor your cardiac device site for redness, swelling, and drainage. Call the device clinic at [phone] if you experience these symptoms, fever/chills, or have questions about your device.   Remote monitoring is used to monitor your cardiac device from home. This monitoring is scheduled every 91 days by our office. It allows us  to keep an eye on the functioning of your device to ensure it is working properly.

## 2024-06-23 NOTE — Progress Notes (Unsigned)
 Cardiology Office Note  Date:  06/24/2024   ID:  Chase Harris, DOB 12-09-1960, MRN [id number]  PCP:  Chase Amis, MD   Chief Complaint  Patient presents with   New Patient (Initial Visit)    Establish care for CAD- s/p CABG 05/10/2024 in [place], [place] & recently had a pacemaker implant @ [facility] in [place].  Patient c/o shortness of breath and a dry cough since the CABG.    HPI:  Chase Harris a 63 y.o. malewith past medical history of: Syncope leading to cardiac catheterization, multivessel disease Coronary artery disease, four-vessel CABG May 10, 2024 Complete heart block, hospitalized, transvenous pacer,  status post dual-chamber pacer June 18, 2024 Postop atrial fibrillation Diabetes type 2 Hypertension,  history of orthostatic hypotension Chronic renal failure 3A Who presents to establish care in the [place] office after recent discharge from the hospital September 2025 for pacemaker  [place], [place] ang[place] symptoms [name] to a local hospital, cardiac catheterization performed showing multivessel disease  transferred to [facility] hospital further evaluation, for CABG  Presenting with shortness of breath June 16, 2024, bradycardic at [facility] regional Heart rate 42, heart block third-degree Echocardiogram June 17, 2024 EF 60 to 65% Normal RV size and function Pacemaker placed  Reports doing well since that time, healing  EKG personally reviewed by myself on todays visit EKG Interpretation Date/Time:  Monday June 24 2024 09:30:22 EDT Ventricular Rate:  95 PR Interval:  192 QRS Duration:  146 QT Interval:  414 QTC Calculation: 520 R Axis:   24  Text Interpretation: Atrial-sensed ventricular-paced rhythm When compared with ECG of 19-Jun-2024 07:22, No significant change was found Confirmed by Chase Harris [phone]) on 06/24/2024 9:45:02 AM    PMH:   has a past medical history of Arthritis, Chronic kidney disease,  Constipation, Diabetes mellitus without complication (HCC), Diabetic peripheral neuropathy (HCC), GERD (gastroesophageal reflux disease), History of prostate cancer, Hypertension, Peripheral vascular disease, Prostate cancer (HCC), Sleep apnea, and Stroke (HCC).  PSH:    Past Surgical History:  Procedure Laterality Date   COLONOSCOPY WITH PROPOFOL  N/A 06/27/2018   Procedure: COLONOSCOPY WITH PROPOFOL ;  Surgeon: Chase Lamar DASEN, MD;  Location: [facility] ENDOSCOPY;  Service: Endoscopy;  Laterality: N/A;   CORONARY ARTERY BYPASS GRAFT     ESOPHAGOGASTRODUODENOSCOPY (EGD) WITH PROPOFOL  N/A 06/27/2018   Procedure: ESOPHAGOGASTRODUODENOSCOPY (EGD) WITH PROPOFOL ;  Surgeon: Chase Lamar DASEN, MD;  Location: [facility] ENDOSCOPY;  Service: Endoscopy;  Laterality: N/A;   HERNIA REPAIR     PACEMAKER IMPLANT N/A 06/18/2024   Procedure: PACEMAKER IMPLANT;  Surgeon: Chase Soyla Lunger, MD;  Location: [facility] INVASIVE CV LAB;  Service: Cardiovascular;  Laterality: N/A;   PROSTATECTOMY     Chase BUNIONECTOMY     TEMPORARY PACEMAKER N/A 06/16/2024   Procedure: TEMPORARY PACEMAKER;  Surgeon: Chase Bruckner, MD;  Location: [facility] INVASIVE CV LAB;  Service: Cardiovascular;  Laterality: N/A;   TOTAL KNEE ARTHROPLASTY Left 02/10/2023   Procedure: TOTAL KNEE ARTHROPLASTY;  Surgeon: Chase Kemps, MD;  Location: [facility] ORS;  Service: Orthopedics;  Laterality: Left;  choice with interscalene block   WISDOM TOOTH EXTRACTION      Current Outpatient Medications  Medication Sig Dispense Refill   acetaminophen  (TYLENOL ) 500 MG tablet Take 1,500 mg by mouth 2 (two) times daily as needed for headache or fever (pain).     allopurinol (ZYLOPRIM) 300 MG tablet Take 300 mg by mouth daily.     aspirin  EC 81 MG tablet Take 81 mg by mouth daily.  ELIQUIS 5 MG TABS tablet Take 5 mg by mouth 2 (two) times daily.     etanercept (ENBREL) 50 MG/ML injection Inject 50 mg into the skin every Wednesday.     Evolocumab (REPATHA) 140 MG/ML SOSY Inject  140 mg into the skin See admin instructions. Inject 1mL (140mg ) into the skin every other Thursday.     FARXIGA 10 MG TABS tablet Take 10 mg by mouth daily.     fenofibrate 160 MG tablet Take 160 mg by mouth daily.     fluticasone (FLONASE) 50 MCG/ACT nasal spray Place 2 sprays into the nose daily as needed for allergies or rhinitis.      furosemide  (LASIX ) 20 MG tablet Take 20 mg by mouth daily.     gabapentin  (NEURONTIN ) 600 MG tablet TAKE ONE TABLET (600 MG TOTAL) BY MOUTH THREE TIMES DAILY. 270 tablet 0   hydroxychloroquine (PLAQUENIL) 200 MG tablet Take 200 mg by mouth 2 (two) times daily.     imipramine (TOFRANIL) 50 MG tablet Take 50 mg by mouth at bedtime.     insulin  aspart (FIASP  FLEXTOUCH) 100 UNIT/ML FlexTouch Pen Inject 45 Units into the skin with breakfast, with lunch, and with evening meal.     insulin  degludec (TRESIBA FLEXTOUCH) 100 UNIT/ML FlexTouch Pen Inject 140 Units into the skin at bedtime.     loratadine (CLARITIN) 10 MG tablet Take 10 mg by mouth daily.      metFORMIN (GLUCOPHAGE) 1000 MG tablet Take 1,000 mg by mouth 2 (two) times a day.      metoprolol  tartrate (LOPRESSOR ) 50 MG tablet Take 50 mg by mouth 2 (two) times daily.     Multiple Vitamins-Minerals (MULTIVITAMIN MEN 50+) TABS Take 1 tablet by mouth daily.     nystatin  (MYCOSTATIN ) 100000 UNIT/ML suspension Take 4 mLs by mouth 4 (four) times daily. 10 day course     omeprazole (PRILOSEC) 40 MG capsule Take 40 mg by mouth daily.     oxyCODONE  (OXY IR/ROXICODONE ) 5 MG immediate release tablet Take 5 mg by mouth every 4 (four) hours as needed for severe pain (pain score 7-10).     predniSONE (DELTASONE) 2.5 MG tablet Take 2.5 mg by mouth daily with breakfast.     spironolactone  (ALDACTONE ) 25 MG tablet Take 25 mg by mouth daily.     No current facility-administered medications for this visit.    Allergies:   Vibramycin  [doxycycline ], Penicillins, and Dynabac [dirithromycin]   Social History:  The patient   reports that he has quit smoking. He has never used smokeless tobacco. He reports current alcohol use of about 4.0 standard drinks of alcohol per week. He reports that he does not use drugs.   Family History:   family history includes Congestive Heart Failure in his father; Diabetes in his paternal aunt and paternal grandmother; Hypertension in his father; Hypothyroidism in his mother; Kidney disease in his father.    Review of Systems: Review of Systems  Constitutional: Negative.   HENT: Negative.    Respiratory: Negative.    Cardiovascular: Negative.   Gastrointestinal: Negative.   Musculoskeletal: Negative.   Neurological: Negative.   Psychiatric/Behavioral: Negative.    All other systems reviewed and are negative.   PHYSICAL EXAM: VS:  BP 128/60 (BP Location: Right Arm, Patient Position: Sitting, Cuff Size: Large)   Ht 5' 9 (1.753 m)   Wt 255 lb 2 oz (115.7 kg)   SpO2 96%   BMI 37.68 kg/m  , BMI Body mass index is  37.68 kg/m. GEN: Well nourished, well developed, in no acute distress HEENT: normal Neck: no JVD, carotid bruits, or masses Cardiac: RRR; no murmurs, rubs, or gallops,no edema  Respiratory:  clear to auscultation bilaterally, normal work of breathing GI: soft, nontender, nondistended, + BS MS: no deformity or atrophy Skin: warm and dry, no rash Neuro:  Strength and sensation are intact Psych: euthymic mood, full affect  Recent Labs: 06/16/2024: ALT 37 06/17/2024: TSH 7.367 06/18/2024: Magnesium  1.9 06/19/2024: BUN 21; Creatinine, Ser 1.51; Hemoglobin 11.4; Platelets 325; Potassium 4.1; Sodium 136   Lipid Panel Lab Results  Component Value Date   CHOL 97 06/17/2024   HDL 43 06/17/2024   LDLCALC 29 06/17/2024   TRIG 123 06/17/2024    Wt Readings from Last 3 Encounters:  06/24/24 255 lb 2 oz (115.7 kg)  06/19/24 249 lb 5.4 oz (113.1 kg)  02/10/23 254 lb (115.2 kg)     ASSESSMENT AND PLAN:  Problem List Items Addressed This Visit       Cardiology  Problems   Complete heart block (HCC)   Relevant Orders   EKG 12-Lead (Completed)   Essential hypertension with goal blood pressure less than 140/90   Relevant Orders   EKG 12-Lead (Completed)   Atherosclerosis of native arteries of extremity with intermittent claudication   Mixed hyperlipidemia     Other   Syncope and collapse   Other Visit Diagnoses       Coronary artery disease of native artery of native heart with stable angina pectoris    -  Primary   Relevant Orders   EKG 12-Lead (Completed)     Hx of CABG       Relevant Orders   EKG 12-Lead (Completed)     Chronic kidney disease, stage 3b (HCC)         Cardiac pacemaker in situ       Relevant Orders   EKG 12-Lead (Completed)      Coronary artery disease with stable angina Underwent CABG, still recovering Blood pressure stable, on aggressive lipid regiment, non-smoker Order placed for cardiac rehab  Complete heart block Still recovering from pacemaker placement last week Has close follow-up with EP EKG today with atrial sensed ventricular paced rhythm  Hyperlipidemia Continue Repatha, fenofibrate Statin intolerance  Chronic kidney disease Working with nephrology, stable, slight improvement in renal function over the past several months   Signed, Chase Harris, M.D., Ph.D. [facility] [place], [place] [phone]

## 2024-06-24 ENCOUNTER — Encounter: Payer: Self-pay | Admitting: Cardiovascular Disease

## 2024-06-24 ENCOUNTER — Ambulatory Visit: Attending: Cardiovascular Disease | Admitting: Cardiovascular Disease

## 2024-06-24 VITALS — BP 128/60 | Ht 69.0 in | Wt 255.1 lb

## 2024-06-24 DIAGNOSIS — Z951 Presence of aortocoronary bypass graft: Secondary | ICD-10-CM | POA: Diagnosis not present

## 2024-06-24 DIAGNOSIS — E782 Mixed hyperlipidemia: Secondary | ICD-10-CM

## 2024-06-24 DIAGNOSIS — I442 Atrioventricular block, complete: Secondary | ICD-10-CM

## 2024-06-24 DIAGNOSIS — I25118 Atherosclerotic heart disease of native coronary artery with other forms of angina pectoris: Secondary | ICD-10-CM | POA: Diagnosis not present

## 2024-06-24 DIAGNOSIS — R55 Syncope and collapse: Secondary | ICD-10-CM

## 2024-06-24 DIAGNOSIS — N1832 Chronic kidney disease, stage 3b: Secondary | ICD-10-CM | POA: Diagnosis not present

## 2024-06-24 DIAGNOSIS — Z950 Presence of cardiac pacemaker: Secondary | ICD-10-CM

## 2024-06-24 DIAGNOSIS — I70213 Atherosclerosis of native arteries of extremities with intermittent claudication, bilateral legs: Secondary | ICD-10-CM

## 2024-06-24 DIAGNOSIS — I10 Essential (primary) hypertension: Secondary | ICD-10-CM

## 2024-06-24 MED ORDER — SPIRONOLACTONE 25 MG PO TABS: 25.0000 mg | ORAL_TABLET | Freq: Every day | ORAL | 3 refills | Status: AC

## 2024-06-24 MED ORDER — ELIQUIS 5 MG PO TABS: 5.0000 mg | ORAL_TABLET | Freq: Two times a day (BID) | ORAL | 3 refills | Status: AC

## 2024-06-24 MED ORDER — FUROSEMIDE 20 MG PO TABS: 20.0000 mg | ORAL_TABLET | Freq: Every day | ORAL | 3 refills | Status: AC

## 2024-06-24 MED ORDER — FARXIGA 10 MG PO TABS: 10.0000 mg | ORAL_TABLET | Freq: Every day | ORAL | 3 refills | Status: AC

## 2024-06-24 MED ORDER — REPATHA 140 MG/ML ~~LOC~~ SOSY: 140.0000 mg | PREFILLED_SYRINGE | SUBCUTANEOUS | 3 refills | Status: DC

## 2024-06-24 MED ORDER — METOPROLOL TARTRATE 50 MG PO TABS: 50.0000 mg | ORAL_TABLET | Freq: Two times a day (BID) | ORAL | 3 refills | Status: AC

## 2024-06-24 NOTE — Patient Instructions (Addendum)
 Order for cardiac rehab  Medication Instructions:  No changes  If you need a refill on your cardiac medications before your next appointment, please call your pharmacy.   Lab work: No new labs needed  Testing/Procedures: No new testing needed  Follow-Up: At [facility], you and your health needs are our priority.  As part of our continuing mission to provide you with exceptional heart care, we have created designated Provider Care Teams.  These Care Teams include your primary Cardiologist (physician) and Advanced Practice Providers (APPs -  Physician Assistants and Nurse Practitioners) who all work together to provide you with the care you need, when you need it.  You will need a follow up appointment in 6 months  Providers on your designated Care Team:   Lonni Meager, NP Bernardino Bring, PA-C Cadence [place], [place]  COVID-19 Vaccine Information can be found at: [email or web address] For questions related to vaccine distribution or appointments, please email [email or web address] or call [phone].

## 2024-07-01 ENCOUNTER — Encounter: Attending: Cardiovascular Disease | Admitting: *Deleted

## 2024-07-01 ENCOUNTER — Encounter: Payer: Self-pay | Admitting: *Deleted

## 2024-07-01 VITALS — Ht 69.0 in | Wt 255.3 lb

## 2024-07-01 DIAGNOSIS — Z951 Presence of aortocoronary bypass graft: Secondary | ICD-10-CM | POA: Insufficient documentation

## 2024-07-01 NOTE — Progress Notes (Signed)
 Cardiac Individual Treatment Plan  Patient Details  Name: Chase Harris MRN: [id number] Date of Birth: 1960/12/30 Referring Provider:   Flowsheet Row Cardiac Rehab from 07/01/2024 in [facility] Cardiac and Pulmonary Rehab  Referring Provider Dr. Evalene Lunger, MD    Initial Encounter Date:  Flowsheet Row Cardiac Rehab from 07/01/2024 in [facility] Cardiac and Pulmonary Rehab  Date 07/01/24    Visit Diagnosis: S/P CABG x 4  Patient's Home Medications on Admission:  Current Outpatient Medications:    acetaminophen  (TYLENOL ) 500 MG tablet, Take 1,500 mg by mouth 2 (two) times daily as needed for headache or fever (pain)., Disp: , Rfl:    allopurinol (ZYLOPRIM) 300 MG tablet, Take 300 mg by mouth daily., Disp: , Rfl:    aspirin  EC 81 MG tablet, Take 81 mg by mouth daily., Disp: , Rfl:    ELIQUIS 5 MG TABS tablet, Take 1 tablet (5 mg total) by mouth 2 (two) times daily., Disp: 180 tablet, Rfl: 3   etanercept (ENBREL) 50 MG/ML injection, Inject 50 mg into the skin every Wednesday., Disp: , Rfl:    Evolocumab (REPATHA) 140 MG/ML SOSY, Inject 140 mg into the skin See admin instructions. Inject 1mL (140mg ) into the skin every other Thursday., Disp: 2.1 mL, Rfl: 3   FARXIGA 10 MG TABS tablet, Take 1 tablet (10 mg total) by mouth daily., Disp: 90 tablet, Rfl: 3   fenofibrate 160 MG tablet, Take 160 mg by mouth daily., Disp: , Rfl:    fluticasone (FLONASE) 50 MCG/ACT nasal spray, Place 2 sprays into the nose daily as needed for allergies or rhinitis. , Disp: , Rfl:    furosemide  (LASIX ) 20 MG tablet, Take 1 tablet (20 mg total) by mouth daily., Disp: 90 tablet, Rfl: 3   gabapentin  (NEURONTIN ) 600 MG tablet, TAKE ONE TABLET (600 MG TOTAL) BY MOUTH THREE TIMES DAILY., Disp: 270 tablet, Rfl: 0   hydroxychloroquine (PLAQUENIL) 200 MG tablet, Take 200 mg by mouth 2 (two) times daily., Disp: , Rfl:    imipramine (TOFRANIL) 50 MG tablet, Take 50 mg by mouth at bedtime., Disp: , Rfl:    insulin  aspart (FIASP   FLEXTOUCH) 100 UNIT/ML FlexTouch Pen, Inject 45 Units into the skin with breakfast, with lunch, and with evening meal., Disp: , Rfl:    insulin  degludec (TRESIBA FLEXTOUCH) 100 UNIT/ML FlexTouch Pen, Inject 140 Units into the skin at bedtime., Disp: , Rfl:    loratadine (CLARITIN) 10 MG tablet, Take 10 mg by mouth daily. , Disp: , Rfl:    metFORMIN (GLUCOPHAGE) 1000 MG tablet, Take 1,000 mg by mouth 2 (two) times a day. , Disp: , Rfl:    metoprolol  tartrate (LOPRESSOR ) 50 MG tablet, Take 1 tablet (50 mg total) by mouth 2 (two) times daily., Disp: 180 tablet, Rfl: 3   Multiple Vitamins-Minerals (MULTIVITAMIN MEN 50+) TABS, Take 1 tablet by mouth daily., Disp: , Rfl:    nystatin  (MYCOSTATIN ) 100000 UNIT/ML suspension, Take 4 mLs by mouth 4 (four) times daily. 10 day course, Disp: , Rfl:    omeprazole (PRILOSEC) 40 MG capsule, Take 40 mg by mouth daily., Disp: , Rfl:    oxyCODONE  (OXY IR/ROXICODONE ) 5 MG immediate release tablet, Take 5 mg by mouth every 4 (four) hours as needed for severe pain (pain score 7-10)., Disp: , Rfl:    predniSONE (DELTASONE) 2.5 MG tablet, Take 2.5 mg by mouth daily with breakfast., Disp: , Rfl:    spironolactone  (ALDACTONE ) 25 MG tablet, Take 1 tablet (25 mg total) by  mouth daily., Disp: 90 tablet, Rfl: 3  Past Medical History: Past Medical History:  Diagnosis Date   Arthritis    Chronic kidney disease    Constipation    Diabetes mellitus without complication (HCC)    Metformin   Diabetic peripheral neuropathy (HCC)    GERD (gastroesophageal reflux disease)    History of prostate cancer    Hypertension    Peripheral vascular disease    Prostate cancer (HCC)    Sleep apnea    has cpap   Stroke [facility])    mini stroke 2016 - no deficitis    Tobacco Use: Social History   Tobacco Use  Smoking Status Former  Smokeless Tobacco Never    Labs: Review Flowsheet       Latest Ref Rng & Units 03/06/2013 04/07/2019 01/31/2023 06/17/2024  Labs for ITP Cardiac and  Pulmonary Rehab  Cholestrol 0 - 200 mg/dL 824  - - 97   LDL (calc) 0 - 99 mg/dL 93  - - 29   HDL-C >59 mg/dL 34  - - 43   Trlycerides <150 mg/dL 761  - - 876   Hemoglobin A1c 4.8 - 5.6 % 10.3  8.2  7.3  7.4      Exercise Target Goals: Exercise Program Goal: Individual exercise prescription set using results from initial 6 min walk test and THRR while considering  patient's activity barriers and safety.   Exercise Prescription Goal: Initial exercise prescription builds to 30-45 minutes a day of aerobic activity, 2-3 days per week.  Home exercise guidelines will be given to patient during program as part of exercise prescription that the participant will acknowledge.   Education: Aerobic Exercise: - Group verbal and visual presentation on the components of exercise prescription. Introduces F.I.T.T principle from ACSM for exercise prescriptions.  Reviews F.I.T.T. principles of aerobic exercise including progression. Written material provided at class time.   Education: Resistance Exercise: - Group verbal and visual presentation on the components of exercise prescription. Introduces F.I.T.T principle from ACSM for exercise prescriptions  Reviews F.I.T.T. principles of resistance exercise including progression. Written material provided at class time.    Education: Exercise & Equipment Safety: - Individual verbal instruction and demonstration of equipment use and safety with use of the equipment. Flowsheet Row Cardiac Rehab from 07/01/2024 in [facility] Cardiac and Pulmonary Rehab  Date 07/01/24  Educator [facility]  Instruction Review Code 1- Verbalizes Understanding    Education: Exercise Physiology & General Exercise Guidelines: - Group verbal and written instruction with models to review the exercise physiology of the cardiovascular system and associated critical values. Provides general exercise guidelines with specific guidelines to those with heart or lung disease. Written material provided at  class time.   Education: Flexibility, Balance, Mind/Body Relaxation: - Group verbal and visual presentation with interactive activity on the components of exercise prescription. Introduces F.I.T.T principle from ACSM for exercise prescriptions. Reviews F.I.T.T. principles of flexibility and balance exercise training including progression. Also discusses the mind body connection.  Reviews various relaxation techniques to help reduce and manage stress (i.e. Deep breathing, progressive muscle relaxation, and visualization). Balance handout provided to take home. Written material provided at class time.   Activity Barriers & Risk Stratification:  Activity Barriers & Cardiac Risk Stratification - 07/01/24 1330       Activity Barriers & Cardiac Risk Stratification   Activity Barriers Left Knee Replacement;Joint Problems;Deconditioning;Arthritis;Balance Concerns;Other (comment)    Comments Plantar Fasciitis    Cardiac Risk Stratification High  6 Minute Walk:  6 Minute Walk     Row Name 07/01/24 1329         6 Minute Walk   Phase Initial     Distance 1005 feet     Walk Time 6 minutes     # of Rest Breaks 0     MPH 1.9     METS 2.28     RPE 13     Perceived Dyspnea  0     VO2 Peak 7.99     Symptoms Yes (comment)     Comments lightheadedness     Resting HR 74 bpm     Resting BP 126/68     Resting Oxygen Saturation  96 %     Exercise Oxygen Saturation  during 6 min walk 96 %     Max Ex. HR 101 bpm     Max Ex. BP 152/68     2 Minute Post BP 134/66        Oxygen Initial Assessment:   Oxygen Re-Evaluation:   Oxygen Discharge (Final Oxygen Re-Evaluation):   Initial Exercise Prescription:  Initial Exercise Prescription - 07/01/24 1300       Date of Initial Exercise RX and Referring Provider   Date 07/01/24    Referring Provider Dr. Timothy Gollan, MD      Oxygen   Maintain Oxygen Saturation 88% or higher      Treadmill   MPH 2    Grade 0    Minutes 15     METs 2.53      NuStep   Level 2   T6 nustep   SPM 80    Minutes 15    METs 2.28      REL-XR   Level 2    Speed 50    Minutes 15    METs 2.28      Prescription Details   Frequency (times per week) 3    Duration Progress to 30 minutes of continuous aerobic without signs/symptoms of physical distress      Intensity   THRR 40-80% of Max Heartrate 106-139    Ratings of Perceived Exertion 11-13    Perceived Dyspnea 0-4      Progression   Progression Continue to progress workloads to maintain intensity without signs/symptoms of physical distress.      Resistance Training   Training Prescription Yes    Weight 5 lb    Reps 10-15          Perform Capillary Blood Glucose checks as needed.  Exercise Prescription Changes:   Exercise Prescription Changes     Row Name 07/01/24 1300             Response to Exercise   Blood Pressure (Admit) 126/68       Blood Pressure (Exercise) 152/68       Blood Pressure (Exit) 134/66       Heart Rate (Admit) 74 bpm       Heart Rate (Exercise) 101 bpm       Heart Rate (Exit) 83 bpm       Oxygen Saturation (Admit) 96 %       Oxygen Saturation (Exercise) 96 %       Rating of Perceived Exertion (Exercise) 13       Perceived Dyspnea (Exercise) 0       Symptoms lightheadedness       Comments Results          Exercise Comments:  Exercise Goals and Review:   Exercise Goals     Row Name 07/01/24 1331             Exercise Goals   Increase Physical Activity Yes       Intervention Develop an individualized exercise prescription for aerobic and resistive training based on initial evaluation findings, risk stratification, comorbidities and participant's personal goals.;Provide advice, education, support and counseling about physical activity/exercise needs.       Expected Outcomes Short Term: Attend rehab on a regular basis to increase amount of physical activity.;Long Term: Add in home exercise to make exercise part of  routine and to increase amount of physical activity.;Long Term: Exercising regularly at least 3-5 days a week.       Increase Strength and Stamina Yes       Intervention Provide advice, education, support and counseling about physical activity/exercise needs.;Develop an individualized exercise prescription for aerobic and resistive training based on initial evaluation findings, risk stratification, comorbidities and participant's personal goals.       Expected Outcomes Short Term: Increase workloads from initial exercise prescription for resistance, speed, and METs.;Short Term: Perform resistance training exercises routinely during rehab and add in resistance training at home;Long Term: Improve cardiorespiratory fitness, muscular endurance and strength as measured by increased METs and functional capacity ( )       Able to understand and use rate of perceived exertion (RPE) scale Yes       Intervention Provide education and explanation on how to use RPE scale       Expected Outcomes Short Term: Able to use RPE daily in rehab to express subjective intensity level;Long Term:  Able to use RPE to guide intensity level when exercising independently       Able to understand and use Dyspnea scale Yes       Intervention Provide education and explanation on how to use Dyspnea scale       Expected Outcomes Short Term: Able to use Dyspnea scale daily in rehab to express subjective sense of shortness of breath during exertion;Long Term: Able to use Dyspnea scale to guide intensity level when exercising independently       Knowledge and understanding of Target Heart Rate Range (THRR) Yes       Intervention Provide education and explanation of THRR including how the numbers were predicted and where they are located for reference       Expected Outcomes Short Term: Able to state/look up THRR;Long Term: Able to use THRR to govern intensity when exercising independently;Short Term: Able to use daily as guideline for  intensity in rehab       Able to check pulse independently Yes       Intervention Provide education and demonstration on how to check pulse in carotid and radial arteries.;Review the importance of being able to check your own pulse for safety during independent exercise       Expected Outcomes Short Term: Able to explain why pulse checking is important during independent exercise;Long Term: Able to check pulse independently and accurately       Understanding of Exercise Prescription Yes       Intervention Provide education, explanation, and written materials on patient's individual exercise prescription       Expected Outcomes Short Term: Able to explain program exercise prescription;Long Term: Able to explain home exercise prescription to exercise independently          Exercise Goals Re-Evaluation :   Discharge Exercise Prescription (Final Exercise  Prescription Changes):  Exercise Prescription Changes - 07/01/24 1300       Response to Exercise   Blood Pressure (Admit) 126/68    Blood Pressure (Exercise) 152/68    Blood Pressure (Exit) 134/66    Heart Rate (Admit) 74 bpm    Heart Rate (Exercise) 101 bpm    Heart Rate (Exit) 83 bpm    Oxygen Saturation (Admit) 96 %    Oxygen Saturation (Exercise) 96 %    Rating of Perceived Exertion (Exercise) 13    Perceived Dyspnea (Exercise) 0    Symptoms lightheadedness    Comments Results          Nutrition:  Target Goals: Understanding of nutrition guidelines, daily intake of sodium 1500mg , cholesterol 200mg , calories 30% from fat and 7% or less from saturated fats, daily to have 5 or more servings of fruits and vegetables.  Education: Nutrition 1 -Group instruction provided by verbal, written material, interactive activities, discussions, models, and posters to present general guidelines for heart healthy nutrition including macronutrients, label reading, and promoting whole foods over processed counterparts. Education serves as  Pensions consultant of discussion of heart healthy eating for all. Written material provided at class time.    Education: Nutrition 2 -Group instruction provided by verbal, written material, interactive activities, discussions, models, and posters to present general guidelines for heart healthy nutrition including sodium, cholesterol, and saturated fat. Providing guidance of habit forming to improve blood pressure, cholesterol, and body weight. Written material provided at class time.     Biometrics:  Pre Biometrics - 07/01/24 1331       Pre Biometrics   Height 5' 9 (1.753 m)    Weight 255 lb 4.8 oz (115.8 kg)    Waist Circumference 48 inches    Hip Circumference 44 inches    Waist to Hip Ratio 1.09 %    BMI (Calculated) 37.68    Single Leg Stand 4.5 seconds           Nutrition Therapy Plan and Nutrition Goals:  Nutrition Therapy & Goals - 07/01/24 1340       Intervention Plan   Intervention Prescribe, educate and counsel regarding individualized specific dietary modifications aiming towards targeted core components such as weight, hypertension, lipid management, diabetes, heart failure and other comorbidities.    Expected Outcomes Short Term Goal: Understand basic principles of dietary content, such as calories, fat, sodium, cholesterol and nutrients.;Short Term Goal: A plan has been developed with personal nutrition goals set during dietitian appointment.;Long Term Goal: Adherence to prescribed nutrition plan.          Nutrition Assessments:  MEDIFICTS Score Key: >=70 Need to make dietary changes  40-70 Heart Healthy Diet <= 40 Therapeutic Level Cholesterol Diet  Flowsheet Row Cardiac Rehab from 07/01/2024 in [facility] Cardiac and Pulmonary Rehab  Picture Your Plate Total Score on Admission 68   Picture Your Plate Scores: <59 Unhealthy dietary pattern with much room for improvement. 41-50 Dietary pattern unlikely to meet recommendations for good health and room for  improvement. 51-60 More healthful dietary pattern, with some room for improvement.  >60 Healthy dietary pattern, although there may be some specific behaviors that could be improved.    Nutrition Goals Re-Evaluation:   Nutrition Goals Discharge (Final Nutrition Goals Re-Evaluation):   Psychosocial: Target Goals: Acknowledge presence or absence of significant depression and/or stress, maximize coping skills, provide positive support system. Participant is able to verbalize types and ability to use techniques and skills needed for reducing stress and  depression.   Education: Stress, Anxiety, and Depression - Group verbal and visual presentation to define topics covered.  Reviews how body is impacted by stress, anxiety, and depression.  Also discusses healthy ways to reduce stress and to treat/manage anxiety and depression. Written material provided at class time.   Education: Sleep Hygiene -Provides group verbal and written instruction about how sleep can affect your health.  Define sleep hygiene, discuss sleep cycles and impact of sleep habits. Review good sleep hygiene tips.   Initial Review & Psychosocial Screening:  Initial Psych Review & Screening - 07/01/24 0946       Initial Review   Current issues with Current Sleep Concerns      Family Dynamics   Good Support System? Yes      Barriers   Psychosocial barriers to participate in program There are no identifiable barriers or psychosocial needs.      Screening Interventions   Interventions Encouraged to exercise;Provide feedback about the scores to participant    Expected Outcomes Short Term goal: Utilizing psychosocial counselor, staff and physician to assist with identification of specific Stressors or current issues interfering with healing process. Setting desired goal for each stressor or current issue identified.;Long Term Goal: Stressors or current issues are controlled or eliminated.;Short Term goal: Identification and  review with participant of any Quality of Life or Depression concerns found by scoring the questionnaire.;Long Term goal: The participant improves quality of Life and PHQ9 Scores as seen by post scores and/or verbalization of changes          Quality of Life Scores:   Quality of Life - 07/01/24 1338       Quality of Life   Select Quality of Life      Quality of Life Scores   Health/Function Pre 16.12 %    Socioeconomic Pre 21.17 %    Psych/Spiritual Pre 21.21 %    Family Pre 27.6 %    GLOBAL Pre 20.1 %         Scores of 19 and below usually indicate a poorer quality of life in these areas.  A difference of  2-3 points is a clinically meaningful difference.  A difference of 2-3 points in the total score of the Quality of Life Index has been associated with significant improvement in overall quality of life, self-image, physical symptoms, and general health in studies assessing change in quality of life.  PHQ-9: Review Flowsheet        No data to display         Interpretation of Total Score  Total Score Depression Severity:  1-4 = Minimal depression, 5-9 = Mild depression, 10-14 = Moderate depression, 15-19 = Moderately severe depression, 20-27 = Severe depression   Psychosocial Evaluation and Intervention:  Psychosocial Evaluation - 07/01/24 1200       Psychosocial Evaluation & Interventions   Interventions Encouraged to exercise with the program and follow exercise prescription;Stress management education;Relaxation education    Comments Eligha is coming to cardiac rehab post CABG. He states he has been healing well and is ready to start getting closer to getting back to the golf course. His sleep has been a recent concern post CABG and then two weeks after his surgery he had to have a pacemaker implanted. His wife helps him manage his health care, she is a Engineer, civil (consulting). He enjoys going to his beach place and golfing. He had a trip planned to [place], but had to be postponed until  May because of  his heart surgery. He notes that he would not have been able to walk and keep up had he gone prior to having surgery, so he is thankful they didn't go and is looking forward to the new trip dates because he feels like he will be ready to enjoy the trip more. He also plans on being in his son's wedding in April and that is a big motivation for him to get stronger. He reports no stress concerns and is ready to start the program    Expected Outcomes Short: attend cardiac rehab for education and exercise Long: develop and maintain positive self care habits    Continue Psychosocial Services  Follow up required by staff          Psychosocial Re-Evaluation:   Psychosocial Discharge (Final Psychosocial Re-Evaluation):   Vocational Rehabilitation: Provide vocational rehab assistance to qualifying candidates.   Vocational Rehab Evaluation & Intervention:  Vocational Rehab - 07/01/24 0945       Initial Vocational Rehab Evaluation & Intervention   Assessment shows need for Vocational Rehabilitation No          Education: Education Goals: Education classes will be provided on a variety of topics geared toward better understanding of heart health and risk factor modification. Participant will state understanding/return demonstration of topics presented as noted by education test scores.  Learning Barriers/Preferences:  Learning Barriers/Preferences - 07/01/24 0942       Learning Barriers/Preferences   Learning Barriers None    Learning Preferences Individual Instruction          General Cardiac Education Topics:  AED/CPR: - Group verbal and written instruction with the use of models to demonstrate the basic use of the AED with the basic ABC's of resuscitation.   Test and Procedures: - Group verbal and visual presentation and models provide information about basic cardiac anatomy and function. Reviews the testing methods done to diagnose heart disease and the  outcomes of the test results. Describes the treatment choices: Medical Management, Angioplasty, or Coronary Bypass Surgery for treating various heart conditions including Myocardial Infarction, Angina, Valve Disease, and Cardiac Arrhythmias. Written material provided at class time.   Medication Safety: - Group verbal and visual instruction to review commonly prescribed medications for heart and lung disease. Reviews the medication, class of the drug, and side effects. Includes the steps to properly store meds and maintain the prescription regimen. Written material provided at class time.   Intimacy: - Group verbal instruction through game format to discuss how heart and lung disease can affect sexual intimacy. Written material provided at class time.   Know Your Numbers and Heart Failure: - Group verbal and visual instruction to discuss disease risk factors for cardiac and pulmonary disease and treatment options.  Reviews associated critical values for Overweight/Obesity, Hypertension, Cholesterol, and Diabetes.  Discusses basics of heart failure: signs/symptoms and treatments.  Introduces Heart Failure Zone chart for action plan for heart failure. Written material provided at class time.   Infection Prevention: - Provides verbal and written material to individual with discussion of infection control including proper hand washing and proper equipment cleaning during exercise session. Flowsheet Row Cardiac Rehab from 07/01/2024 in [facility] Cardiac and Pulmonary Rehab  Date 07/01/24  Educator [facility]  Instruction Review Code 1- Verbalizes Understanding    Falls Prevention: - Provides verbal and written material to individual with discussion of falls prevention and safety. Flowsheet Row Cardiac Rehab from 07/01/2024 in [facility] Cardiac and Pulmonary Rehab  Date 07/01/24  Educator [facility]  Instruction Review Code 1- Verbalizes Understanding    Other: -Provides group and verbal instruction on various topics  (see comments)   Knowledge Questionnaire Score:  Knowledge Questionnaire Score - 07/01/24 1338       Knowledge Questionnaire Score   Pre Score 25/26          Core Components/Risk Factors/Patient Goals at Admission:  Personal Goals and Risk Factors at Admission - 07/01/24 0940       Core Components/Risk Factors/Patient Goals on Admission    Weight Management Yes;Weight Loss    Intervention Weight Management: Develop a combined nutrition and exercise program designed to reach desired caloric intake, while maintaining appropriate intake of nutrient and fiber, sodium and fats, and appropriate energy expenditure required for the weight goal.;Weight Management: Provide education and appropriate resources to help participant work on and attain dietary goals.;Weight Management/Obesity: Establish reasonable short term and long term weight goals.;Obesity: Provide education and appropriate resources to help participant work on and attain dietary goals.    Admit Weight 255 lb (115.7 kg)    Goal Weight: Long Term 225 lb (102.1 kg)    Expected Outcomes Short Term: Continue to assess and modify interventions until short term weight is achieved;Long Term: Adherence to nutrition and physical activity/exercise program aimed toward attainment of established weight goal;Weight Loss: Understanding of general recommendations for a balanced deficit meal plan, which promotes 1-2 lb weight loss per week and includes a negative energy balance of [phone] kcal/d;Understanding recommendations for meals to include 15-35% energy as protein, 25-35% energy from fat, 35-60% energy from carbohydrates, less than 200mg  of dietary cholesterol, 20-35 gm of total fiber daily;Understanding of distribution of calorie intake throughout the day with the consumption of 4-5 meals/snacks    Diabetes Yes    Intervention Provide education about signs/symptoms and action to take for hypo/hyperglycemia.;Provide education about proper  nutrition, including hydration, and aerobic/resistive exercise prescription along with prescribed medications to achieve blood glucose in normal ranges: Fasting glucose 65-99 mg/dL    Expected Outcomes Short Term: Participant verbalizes understanding of the signs/symptoms and immediate care of hyper/hypoglycemia, proper foot care and importance of medication, aerobic/resistive exercise and nutrition plan for blood glucose control.;Long Term: Attainment of HbA1C < 7%.    Hypertension Yes    Intervention Monitor prescription use compliance.;Provide education on lifestyle modifcations including regular physical activity/exercise, weight management, moderate sodium restriction and increased consumption of fresh fruit, vegetables, and low fat dairy, alcohol moderation, and smoking cessation.    Expected Outcomes Short Term: Continued assessment and intervention until BP is < 140/31mm HG in hypertensive participants. < 130/78mm HG in hypertensive participants with diabetes, heart failure or chronic kidney disease.;Long Term: Maintenance of blood pressure at goal levels.    Lipids Yes    Intervention Provide education and support for participant on nutrition & aerobic/resistive exercise along with prescribed medications to achieve LDL 70mg , HDL >40mg .    Expected Outcomes Short Term: Participant states understanding of desired cholesterol values and is compliant with medications prescribed. Participant is following exercise prescription and nutrition guidelines.;Long Term: Cholesterol controlled with medications as prescribed, with individualized exercise RX and with personalized nutrition plan. Value goals: LDL < 70mg , HDL > 40 mg.          Education:Diabetes - Individual verbal and written instruction to review signs/symptoms of diabetes, desired ranges of glucose level fasting, after meals and with exercise. Acknowledge that pre and post exercise glucose checks will be done for 3 sessions at entry of  program. [employer]  Cardiac Rehab from 07/01/2024 in [facility] Cardiac and Pulmonary Rehab  Date 07/01/24  Educator [facility]  Instruction Review Code 1- Verbalizes Understanding    Core Components/Risk Factors/Patient Goals Review:    Core Components/Risk Factors/Patient Goals at Discharge (Final Review):    ITP Comments:  ITP Comments     Row Name 07/01/24 1208           ITP Comments Initital orientation and 6 MWT completed.  Initial ITP created and sent for review to Dr. Oneil Pinal, Medical Director.          Comments: Initial ITP

## 2024-07-01 NOTE — Progress Notes (Signed)
 Assessment start time: 10:04 AM  Digestive issues/concerns: no known food allergies   24-hours Recall: B: country ham and egg sandwich L: [place] wrap on spinach wrap D: grilled chicken   Beverages coffee 1 cup, water (32oz), unsweet tea 80oz  Education r/t nutrition plan Patient drinking mostly unsweet tea, getting about 2 bottles of water in per day. He usually eat 3 meals per day. His wife is a Charity fundraiser and tries to help him eat heart healthy and keep his DM under control. Spoke with about eating carb smart versus eating for heart health. Reviewed mediterranean diet handout, provided guidelines of less than 1500mg  sodium and less than 12g saturated fat per day, Educated on types of fats,sources, and how to read facts labels. Brainstormed a few meals with a focus on reducing sodium and saturated fat and including more colorful produce. A chicken pasta with side salad as an example with half as much sodium and saturated fat as the country ham sandwich while still the same amount of calories and carbs.    Goal 1: Read labels and reduce sodium intake to below 2300mg . Ideally 1500mg  per day.  Goal 2: Reduce saturated fat, less than 12g per day. Replace bad fats for more heart healthy fats.  Goal 3: Include more colorful produce, aim for 5-8 servings of fruits and veggies per day  End time 10:47 AM

## 2024-07-01 NOTE — Patient Instructions (Signed)
 Patient Instructions  Patient Details  Name: Chase Harris MRN: [id number] Date of Birth: 1960/12/09 Referring Provider:  Alla Amis, MD  Below are your personal goals for exercise, nutrition, and risk factors. Our goal is to help you stay on track towards obtaining and maintaining these goals. We will be discussing your progress on these goals with you throughout the program.  Initial Exercise Prescription:  Initial Exercise Prescription - 07/01/24 1300       Date of Initial Exercise RX and Referring Provider   Date 07/01/24    Referring Provider Dr. Timothy Gollan, MD      Oxygen   Maintain Oxygen Saturation 88% or higher      Treadmill   MPH 2    Grade 0    Minutes 15    METs 2.53      NuStep   Level 2   T6 nustep   SPM 80    Minutes 15    METs 2.28      REL-XR   Level 2    Speed 50    Minutes 15    METs 2.28      Prescription Details   Frequency (times per week) 3    Duration Progress to 30 minutes of continuous aerobic without signs/symptoms of physical distress      Intensity   THRR 40-80% of Max Heartrate 106-139    Ratings of Perceived Exertion 11-13    Perceived Dyspnea 0-4      Progression   Progression Continue to progress workloads to maintain intensity without signs/symptoms of physical distress.      Resistance Training   Training Prescription Yes    Weight 5 lb    Reps 10-15          Exercise Goals: Frequency: Be able to perform aerobic exercise two to three times per week in program working toward 2-5 days per week of home exercise.  Intensity: Work with a perceived exertion of 11 (fairly light) - 15 (hard) while following your exercise prescription.  We will make changes to your prescription with you as you progress through the program.   Duration: Be able to do 30 to 45 minutes of continuous aerobic exercise in addition to a 5 minute warm-up and a 5 minute cool-down routine.   Nutrition Goals: Your personal nutrition  goals will be established when you do your nutrition analysis with the dietician.  The following are general nutrition guidelines to follow: Cholesterol < 200mg /day Sodium < 1500mg /day Fiber: Men over 50 yrs - 30 grams per day  Personal Goals:  Personal Goals and Risk Factors at Admission - 07/01/24 0940       Core Components/Risk Factors/Patient Goals on Admission    Weight Management Yes;Weight Loss    Intervention Weight Management: Develop a combined nutrition and exercise program designed to reach desired caloric intake, while maintaining appropriate intake of nutrient and fiber, sodium and fats, and appropriate energy expenditure required for the weight goal.;Weight Management: Provide education and appropriate resources to help participant work on and attain dietary goals.;Weight Management/Obesity: Establish reasonable short term and long term weight goals.;Obesity: Provide education and appropriate resources to help participant work on and attain dietary goals.    Admit Weight 255 lb (115.7 kg)    Goal Weight: Long Term 225 lb (102.1 kg)    Expected Outcomes Short Term: Continue to assess and modify interventions until short term weight is achieved;Long Term: Adherence to nutrition and physical activity/exercise program aimed toward attainment  of established weight goal;Weight Loss: Understanding of general recommendations for a balanced deficit meal plan, which promotes 1-2 lb weight loss per week and includes a negative energy balance of [phone] kcal/d;Understanding recommendations for meals to include 15-35% energy as protein, 25-35% energy from fat, 35-60% energy from carbohydrates, less than 200mg  of dietary cholesterol, 20-35 gm of total fiber daily;Understanding of distribution of calorie intake throughout the day with the consumption of 4-5 meals/snacks    Diabetes Yes    Intervention Provide education about signs/symptoms and action to take for hypo/hyperglycemia.;Provide  education about proper nutrition, including hydration, and aerobic/resistive exercise prescription along with prescribed medications to achieve blood glucose in normal ranges: Fasting glucose 65-99 mg/dL    Expected Outcomes Short Term: Participant verbalizes understanding of the signs/symptoms and immediate care of hyper/hypoglycemia, proper foot care and importance of medication, aerobic/resistive exercise and nutrition plan for blood glucose control.;Long Term: Attainment of HbA1C < 7%.    Hypertension Yes    Intervention Monitor prescription use compliance.;Provide education on lifestyle modifcations including regular physical activity/exercise, weight management, moderate sodium restriction and increased consumption of fresh fruit, vegetables, and low fat dairy, alcohol moderation, and smoking cessation.    Expected Outcomes Short Term: Continued assessment and intervention until BP is < 140/2mm HG in hypertensive participants. < 130/47mm HG in hypertensive participants with diabetes, heart failure or chronic kidney disease.;Long Term: Maintenance of blood pressure at goal levels.    Lipids Yes    Intervention Provide education and support for participant on nutrition & aerobic/resistive exercise along with prescribed medications to achieve LDL 70mg , HDL >40mg .    Expected Outcomes Short Term: Participant states understanding of desired cholesterol values and is compliant with medications prescribed. Participant is following exercise prescription and nutrition guidelines.;Long Term: Cholesterol controlled with medications as prescribed, with individualized exercise RX and with personalized nutrition plan. Value goals: LDL < 70mg , HDL > 40 mg.          Exercise Goals and Review:  Exercise Goals     Row Name 07/01/24 1331             Exercise Goals   Increase Physical Activity Yes       Intervention Develop an individualized exercise prescription for aerobic and resistive training based  on initial evaluation findings, risk stratification, comorbidities and participant's personal goals.;Provide advice, education, support and counseling about physical activity/exercise needs.       Expected Outcomes Short Term: Attend rehab on a regular basis to increase amount of physical activity.;Long Term: Add in home exercise to make exercise part of routine and to increase amount of physical activity.;Long Term: Exercising regularly at least 3-5 days a week.       Increase Strength and Stamina Yes       Intervention Provide advice, education, support and counseling about physical activity/exercise needs.;Develop an individualized exercise prescription for aerobic and resistive training based on initial evaluation findings, risk stratification, comorbidities and participant's personal goals.       Expected Outcomes Short Term: Increase workloads from initial exercise prescription for resistance, speed, and METs.;Short Term: Perform resistance training exercises routinely during rehab and add in resistance training at home;Long Term: Improve cardiorespiratory fitness, muscular endurance and strength as measured by increased METs and functional capacity ( )       Able to understand and use rate of perceived exertion (RPE) scale Yes       Intervention Provide education and explanation on how to use RPE scale  Expected Outcomes Short Term: Able to use RPE daily in rehab to express subjective intensity level;Long Term:  Able to use RPE to guide intensity level when exercising independently       Able to understand and use Dyspnea scale Yes       Intervention Provide education and explanation on how to use Dyspnea scale       Expected Outcomes Short Term: Able to use Dyspnea scale daily in rehab to express subjective sense of shortness of breath during exertion;Long Term: Able to use Dyspnea scale to guide intensity level when exercising independently       Knowledge and understanding of Target  Heart Rate Range (THRR) Yes       Intervention Provide education and explanation of THRR including how the numbers were predicted and where they are located for reference       Expected Outcomes Short Term: Able to state/look up THRR;Long Term: Able to use THRR to govern intensity when exercising independently;Short Term: Able to use daily as guideline for intensity in rehab       Able to check pulse independently Yes       Intervention Provide education and demonstration on how to check pulse in carotid and radial arteries.;Review the importance of being able to check your own pulse for safety during independent exercise       Expected Outcomes Short Term: Able to explain why pulse checking is important during independent exercise;Long Term: Able to check pulse independently and accurately       Understanding of Exercise Prescription Yes       Intervention Provide education, explanation, and written materials on patient's individual exercise prescription       Expected Outcomes Short Term: Able to explain program exercise prescription;Long Term: Able to explain home exercise prescription to exercise independently

## 2024-07-03 ENCOUNTER — Ambulatory Visit: Attending: Cardiovascular Disease

## 2024-07-03 DIAGNOSIS — I442 Atrioventricular block, complete: Secondary | ICD-10-CM | POA: Diagnosis not present

## 2024-07-03 NOTE — Patient Instructions (Signed)

## 2024-07-03 NOTE — Progress Notes (Signed)
 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 Routine testing performed. Thresholds, sensing, and impedance consistent with implant measurements and at 3.5V safety margin/auto capture until 3 month visit. AT/AF burden <1%. Reviewed arm restrictions to continue for 6 weeks total post op.  Pt enrolled in remote follow-up.

## 2024-07-15 ENCOUNTER — Encounter

## 2024-07-17 ENCOUNTER — Encounter

## 2024-07-17 DIAGNOSIS — Z951 Presence of aortocoronary bypass graft: Secondary | ICD-10-CM | POA: Diagnosis not present

## 2024-07-17 LAB — GLUCOSE, CAPILLARY
Glucose-Capillary: 177 mg/dL — ABNORMAL HIGH (ref 70–99)
Glucose-Capillary: 122 mg/dL — ABNORMAL HIGH (ref 70–99)

## 2024-07-17 NOTE — Progress Notes (Signed)
 Daily Session Note  Patient Details  Name: Chase Harris MRN: [id number] Date of Birth: 19-May-1961 Referring Provider:   Flowsheet Row Cardiac Rehab from 07/01/2024 in [facility] Cardiac and Pulmonary Rehab  Referring Provider Dr. Evalene Lunger, MD    Encounter Date: 07/17/2024  Check In:  Session Check In - 07/17/24 0752       Check-In   Supervising physician immediately available to respond to emergencies See telemetry face sheet for immediately available ER MD    Location [facility]-Cardiac & Pulmonary Rehab    Staff Present Burnard Davenport RN,BSN,MPA;Joseph Hood RCP,RRT,BSRT;Noah Tickle, [place], Exercise Physiologist;Jason Elnor RDN,LDN    Virtual Visit No    Medication changes reported     No    Fall or balance concerns reported    No    Warm-up and Cool-down Performed on first and last piece of equipment    Resistance Training Performed Yes    VAD Patient? No    PAD/SET Patient? No      Pain Assessment   Currently in Pain? No/denies             Social History   Tobacco Use  Smoking Status Former  Smokeless Tobacco Never    Goals Met:  Independence with exercise equipment Exercise tolerated well No report of concerns or symptoms today Strength training completed today  Goals Unmet:  Not Applicable  Comments: First full day of exercise!  Patient was oriented to gym and equipment including functions, settings, policies, and procedures.  Patient's individual exercise prescription and treatment plan were reviewed.  All starting workloads were established based on the results of the 6 minute walk test done at initial orientation visit.  The plan for exercise progression was also introduced and progression will be customized based on patient's performance and goals.    Dr. Oneil Pinal is Medical Director for [facility] Cardiac Rehabilitation.  Dr. Fuad Aleskerov is Medical Director for [facility] Pulmonary Rehabilitation.

## 2024-07-17 NOTE — Progress Notes (Signed)
 Cardiac Individual Treatment Plan  Patient Details  Name: Chase Harris MRN: [id number] Date of Birth: 01-Sep-1961 Referring Provider:   Flowsheet Row Cardiac Rehab from 07/01/2024 in [facility] Cardiac and Pulmonary Rehab  Referring Provider Dr. Evalene Lunger, MD    Initial Encounter Date:  Flowsheet Row Cardiac Rehab from 07/01/2024 in [facility] Cardiac and Pulmonary Rehab  Date 07/01/24    Visit Diagnosis: S/P CABG x 4  Patient's Home Medications on Admission:  Current Outpatient Medications:    acetaminophen  (TYLENOL ) 500 MG tablet, Take 1,500 mg by mouth 2 (two) times daily as needed for headache or fever (pain)., Disp: , Rfl:    allopurinol (ZYLOPRIM) 300 MG tablet, Take 300 mg by mouth daily., Disp: , Rfl:    ELIQUIS 5 MG TABS tablet, Take 1 tablet (5 mg total) by mouth 2 (two) times daily., Disp: 180 tablet, Rfl: 3   etanercept (ENBREL) 50 MG/ML injection, Inject 50 mg into the skin every Wednesday., Disp: , Rfl:    Evolocumab (REPATHA) 140 MG/ML SOSY, Inject 140 mg into the skin See admin instructions. Inject 1mL (140mg ) into the skin every other Thursday., Disp: 2.1 mL, Rfl: 3   FARXIGA 10 MG TABS tablet, Take 1 tablet (10 mg total) by mouth daily., Disp: 90 tablet, Rfl: 3   fenofibrate 160 MG tablet, Take 160 mg by mouth daily., Disp: , Rfl:    fluticasone (FLONASE) 50 MCG/ACT nasal spray, Place 2 sprays into the nose daily as needed for allergies or rhinitis. , Disp: , Rfl:    furosemide  (LASIX ) 20 MG tablet, Take 1 tablet (20 mg total) by mouth daily., Disp: 90 tablet, Rfl: 3   gabapentin  (NEURONTIN ) 600 MG tablet, TAKE ONE TABLET (600 MG TOTAL) BY MOUTH THREE TIMES DAILY., Disp: 270 tablet, Rfl: 0   hydroxychloroquine (PLAQUENIL) 200 MG tablet, Take 200 mg by mouth 2 (two) times daily., Disp: , Rfl:    imipramine (TOFRANIL) 50 MG tablet, Take 50 mg by mouth at bedtime., Disp: , Rfl:    insulin  aspart (FIASP  FLEXTOUCH) 100 UNIT/ML FlexTouch Pen, Inject 45 Units into the skin with  breakfast, with lunch, and with evening meal., Disp: , Rfl:    insulin  degludec (TRESIBA FLEXTOUCH) 100 UNIT/ML FlexTouch Pen, Inject 140 Units into the skin at bedtime., Disp: , Rfl:    loratadine (CLARITIN) 10 MG tablet, Take 10 mg by mouth daily. , Disp: , Rfl:    metFORMIN (GLUCOPHAGE) 1000 MG tablet, Take 1,000 mg by mouth 2 (two) times a day. , Disp: , Rfl:    metoprolol  tartrate (LOPRESSOR ) 50 MG tablet, Take 1 tablet (50 mg total) by mouth 2 (two) times daily., Disp: 180 tablet, Rfl: 3   Multiple Vitamins-Minerals (MULTIVITAMIN MEN 50+) TABS, Take 1 tablet by mouth daily., Disp: , Rfl:    nystatin  (MYCOSTATIN ) 100000 UNIT/ML suspension, Take 4 mLs by mouth 4 (four) times daily. 10 day course, Disp: , Rfl:    omeprazole (PRILOSEC) 40 MG capsule, Take 40 mg by mouth daily., Disp: , Rfl:    oxyCODONE  (OXY IR/ROXICODONE ) 5 MG immediate release tablet, Take 5 mg by mouth every 4 (four) hours as needed for severe pain (pain score 7-10)., Disp: , Rfl:    predniSONE (DELTASONE) 2.5 MG tablet, Take 2.5 mg by mouth daily with breakfast., Disp: , Rfl:    spironolactone  (ALDACTONE ) 25 MG tablet, Take 1 tablet (25 mg total) by mouth daily., Disp: 90 tablet, Rfl: 3  Past Medical History: Past Medical History:  Diagnosis Date  Arthritis    Chronic kidney disease    Constipation    Diabetes mellitus without complication (HCC)    Metformin   Diabetic peripheral neuropathy (HCC)    GERD (gastroesophageal reflux disease)    History of prostate cancer    Hypertension    Peripheral vascular disease    Prostate cancer (HCC)    Sleep apnea    has cpap   Stroke [facility])    mini stroke 2016 - no deficitis    Tobacco Use: Social History   Tobacco Use  Smoking Status Former  Smokeless Tobacco Never    Labs: Review Flowsheet       Latest Ref Rng & Units 03/06/2013 04/07/2019 01/31/2023 06/17/2024  Labs for ITP Cardiac and Pulmonary Rehab  Cholestrol 0 - 200 mg/dL 824  - - 97   LDL (calc) 0 - 99  mg/dL 93  - - 29   HDL-C >59 mg/dL 34  - - 43   Trlycerides <150 mg/dL 761  - - 876   Hemoglobin A1c 4.8 - 5.6 % 10.3  8.2  7.3  7.4      Exercise Target Goals: Exercise Program Goal: Individual exercise prescription set using results from initial 6 min walk test and THRR while considering  patient's activity barriers and safety.   Exercise Prescription Goal: Initial exercise prescription builds to 30-45 minutes a day of aerobic activity, 2-3 days per week.  Home exercise guidelines will be given to patient during program as part of exercise prescription that the participant will acknowledge.   Education: Aerobic Exercise: - Group verbal and visual presentation on the components of exercise prescription. Introduces F.I.T.T principle from ACSM for exercise prescriptions.  Reviews F.I.T.T. principles of aerobic exercise including progression. Written material provided at class time.   Education: Resistance Exercise: - Group verbal and visual presentation on the components of exercise prescription. Introduces F.I.T.T principle from ACSM for exercise prescriptions  Reviews F.I.T.T. principles of resistance exercise including progression. Written material provided at class time.    Education: Exercise & Equipment Safety: - Individual verbal instruction and demonstration of equipment use and safety with use of the equipment. Flowsheet Row Cardiac Rehab from 07/17/2024 in [facility] Cardiac and Pulmonary Rehab  Date 07/01/24  Educator [facility]  Instruction Review Code 1- Verbalizes Understanding    Education: Exercise Physiology & General Exercise Guidelines: - Group verbal and written instruction with models to review the exercise physiology of the cardiovascular system and associated critical values. Provides general exercise guidelines with specific guidelines to those with heart or lung disease. Written material provided at class time.   Education: Flexibility, Balance, Mind/Body Relaxation: -  Group verbal and visual presentation with interactive activity on the components of exercise prescription. Introduces F.I.T.T principle from ACSM for exercise prescriptions. Reviews F.I.T.T. principles of flexibility and balance exercise training including progression. Also discusses the mind body connection.  Reviews various relaxation techniques to help reduce and manage stress (i.e. Deep breathing, progressive muscle relaxation, and visualization). Balance handout provided to take home. Written material provided at class time.   Activity Barriers & Risk Stratification:  Activity Barriers & Cardiac Risk Stratification - 07/01/24 1330       Activity Barriers & Cardiac Risk Stratification   Activity Barriers Left Knee Replacement;Joint Problems;Deconditioning;Arthritis;Balance Concerns;Other (comment)    Comments Plantar Fasciitis    Cardiac Risk Stratification High          6 Minute Walk:  6 Minute Walk     Row Name 07/01/24 1329  6 Minute Walk   Phase Initial     Distance 1005 feet     Walk Time 6 minutes     # of Rest Breaks 0     MPH 1.9     METS 2.28     RPE 13     Perceived Dyspnea  0     VO2 Peak 7.99     Symptoms Yes (comment)     Comments lightheadedness     Resting HR 74 bpm     Resting BP 126/68     Resting Oxygen Saturation  96 %     Exercise Oxygen Saturation  during 6 min walk 96 %     Max Ex. HR 101 bpm     Max Ex. BP 152/68     2 Minute Post BP 134/66        Oxygen Initial Assessment:   Oxygen Re-Evaluation:   Oxygen Discharge (Final Oxygen Re-Evaluation):   Initial Exercise Prescription:  Initial Exercise Prescription - 07/01/24 1300       Date of Initial Exercise RX and Referring Provider   Date 07/01/24    Referring Provider Dr. Timothy Gollan, MD      Oxygen   Maintain Oxygen Saturation 88% or higher      Treadmill   MPH 2    Grade 0    Minutes 15    METs 2.53      NuStep   Level 2   T6 nustep   SPM 80    Minutes 15     METs 2.28      REL-XR   Level 2    Speed 50    Minutes 15    METs 2.28      Prescription Details   Frequency (times per week) 3    Duration Progress to 30 minutes of continuous aerobic without signs/symptoms of physical distress      Intensity   THRR 40-80% of Max Heartrate 106-139    Ratings of Perceived Exertion 11-13    Perceived Dyspnea 0-4      Progression   Progression Continue to progress workloads to maintain intensity without signs/symptoms of physical distress.      Resistance Training   Training Prescription Yes    Weight 5 lb    Reps 10-15          Perform Capillary Blood Glucose checks as needed.  Exercise Prescription Changes:   Exercise Prescription Changes     Row Name 07/01/24 1300             Response to Exercise   Blood Pressure (Admit) 126/68       Blood Pressure (Exercise) 152/68       Blood Pressure (Exit) 134/66       Heart Rate (Admit) 74 bpm       Heart Rate (Exercise) 101 bpm       Heart Rate (Exit) 83 bpm       Oxygen Saturation (Admit) 96 %       Oxygen Saturation (Exercise) 96 %       Rating of Perceived Exertion (Exercise) 13       Perceived Dyspnea (Exercise) 0       Symptoms lightheadedness       Comments Results          Exercise Comments:   Exercise Comments     Row Name 07/17/24 0752           Exercise Comments  First full day of exercise!  Patient was oriented to gym and equipment including functions, settings, policies, and procedures.  Patient's individual exercise prescription and treatment plan were reviewed.  All starting workloads were established based on the results of the 6 minute walk test done at initial orientation visit.  The plan for exercise progression was also introduced and progression will be customized based on patient's performance and goals.          Exercise Goals and Review:   Exercise Goals     Row Name 07/01/24 1331             Exercise Goals   Increase Physical  Activity Yes       Intervention Develop an individualized exercise prescription for aerobic and resistive training based on initial evaluation findings, risk stratification, comorbidities and participant's personal goals.;Provide advice, education, support and counseling about physical activity/exercise needs.       Expected Outcomes Short Term: Attend rehab on a regular basis to increase amount of physical activity.;Long Term: Add in home exercise to make exercise part of routine and to increase amount of physical activity.;Long Term: Exercising regularly at least 3-5 days a week.       Increase Strength and Stamina Yes       Intervention Provide advice, education, support and counseling about physical activity/exercise needs.;Develop an individualized exercise prescription for aerobic and resistive training based on initial evaluation findings, risk stratification, comorbidities and participant's personal goals.       Expected Outcomes Short Term: Increase workloads from initial exercise prescription for resistance, speed, and METs.;Short Term: Perform resistance training exercises routinely during rehab and add in resistance training at home;Long Term: Improve cardiorespiratory fitness, muscular endurance and strength as measured by increased METs and functional capacity ( )       Able to understand and use rate of perceived exertion (RPE) scale Yes       Intervention Provide education and explanation on how to use RPE scale       Expected Outcomes Short Term: Able to use RPE daily in rehab to express subjective intensity level;Long Term:  Able to use RPE to guide intensity level when exercising independently       Able to understand and use Dyspnea scale Yes       Intervention Provide education and explanation on how to use Dyspnea scale       Expected Outcomes Short Term: Able to use Dyspnea scale daily in rehab to express subjective sense of shortness of breath during exertion;Long Term: Able to  use Dyspnea scale to guide intensity level when exercising independently       Knowledge and understanding of Target Heart Rate Range (THRR) Yes       Intervention Provide education and explanation of THRR including how the numbers were predicted and where they are located for reference       Expected Outcomes Short Term: Able to state/look up THRR;Long Term: Able to use THRR to govern intensity when exercising independently;Short Term: Able to use daily as guideline for intensity in rehab       Able to check pulse independently Yes       Intervention Provide education and demonstration on how to check pulse in carotid and radial arteries.;Review the importance of being able to check your own pulse for safety during independent exercise       Expected Outcomes Short Term: Able to explain why pulse checking is important during independent exercise;Long Term: Able to check  pulse independently and accurately       Understanding of Exercise Prescription Yes       Intervention Provide education, explanation, and written materials on patient's individual exercise prescription       Expected Outcomes Short Term: Able to explain program exercise prescription;Long Term: Able to explain home exercise prescription to exercise independently          Exercise Goals Re-Evaluation :  Exercise Goals Re-Evaluation     Row Name 07/17/24 0752             Exercise Goal Re-Evaluation   Exercise Goals Review Increase Physical Activity;Able to understand and use rate of perceived exertion (RPE) scale;Knowledge and understanding of Target Heart Rate Range (THRR);Understanding of Exercise Prescription;Increase Strength and Stamina;Able to understand and use Dyspnea scale;Able to check pulse independently       Comments Reviewed RPE and dyspnea scale, THR and program prescription with pt today.  Pt voiced understanding and was given a copy of goals to take home.       Expected Outcomes Short: Use RPE daily to  regulate intensity. Long: Follow program prescription in THR.          Discharge Exercise Prescription (Final Exercise Prescription Changes):  Exercise Prescription Changes - 07/01/24 1300       Response to Exercise   Blood Pressure (Admit) 126/68    Blood Pressure (Exercise) 152/68    Blood Pressure (Exit) 134/66    Heart Rate (Admit) 74 bpm    Heart Rate (Exercise) 101 bpm    Heart Rate (Exit) 83 bpm    Oxygen Saturation (Admit) 96 %    Oxygen Saturation (Exercise) 96 %    Rating of Perceived Exertion (Exercise) 13    Perceived Dyspnea (Exercise) 0    Symptoms lightheadedness    Comments Results          Nutrition:  Target Goals: Understanding of nutrition guidelines, daily intake of sodium 1500mg , cholesterol 200mg , calories 30% from fat and 7% or less from saturated fats, daily to have 5 or more servings of fruits and vegetables.  Education: Nutrition 1 -Group instruction provided by verbal, written material, interactive activities, discussions, models, and posters to present general guidelines for heart healthy nutrition including macronutrients, label reading, and promoting whole foods over processed counterparts. Education serves as Pensions consultant of discussion of heart healthy eating for all. Written material provided at class time. Flowsheet Row Cardiac Rehab from 07/17/2024 in [facility] Cardiac and Pulmonary Rehab  Date 07/17/24  Educator jg  Instruction Review Code 1- Verbalizes Understanding     Education: Nutrition 2 -Group instruction provided by verbal, written material, interactive activities, discussions, models, and posters to present general guidelines for heart healthy nutrition including sodium, cholesterol, and saturated fat. Providing guidance of habit forming to improve blood pressure, cholesterol, and body weight. Written material provided at class time.     Biometrics:  Pre Biometrics - 07/01/24 1331       Pre Biometrics   Height 5' 9 (1.753  m)    Weight 255 lb 4.8 oz (115.8 kg)    Waist Circumference 48 inches    Hip Circumference 44 inches    Waist to Hip Ratio 1.09 %    BMI (Calculated) 37.68    Single Leg Stand 4.5 seconds           Nutrition Therapy Plan and Nutrition Goals:  Nutrition Therapy & Goals - 07/01/24 1341       Nutrition Therapy  Protein (specify units) 80    Fiber 30 grams    Whole Grain Foods 3 servings    Saturated Fats 15 max. grams    Fruits and Vegetables 5 servings/day    Sodium 2 grams      Personal Nutrition Goals   Nutrition Goal Read labels and reduce sodium intake to below 2300mg . Ideally 1500mg  per day.    Personal Goal #2 Reduce saturated fat, less than 12g per day. Replace bad fats for more heart healthy fats.    Personal Goal #3 Include more colorful produce, aim for 5-8 servings of fruits and veggies per day    Comments Patient drinking mostly unsweet tea, getting about 2 bottles of water in per day. He usually eat 3 meals per day. His wife is a Charity fundraiser and tries to help him eat heart healthy and keep his DM under control. Spoke with about eating carb smart versus eating for heart health. Reviewed mediterranean diet handout, provided guidelines of less than 1500mg  sodium and less than 12g saturated fat per day, Educated on types of fats,sources, and how to read facts labels. Brainstormed a few meals with a focus on reducing sodium and saturated fat and including more colorful produce. A chicken pasta with side salad as an example with half as much sodium and saturated fat as the country ham sandwich while still the same amount of calories and carbs.      Intervention Plan   Intervention Prescribe, educate and counsel regarding individualized specific dietary modifications aiming towards targeted core components such as weight, hypertension, lipid management, diabetes, heart failure and other comorbidities.;Nutrition handout(s) given to patient.    Expected Outcomes Short Term Goal:  Understand basic principles of dietary content, such as calories, fat, sodium, cholesterol and nutrients.;Short Term Goal: A plan has been developed with personal nutrition goals set during dietitian appointment.;Long Term Goal: Adherence to prescribed nutrition plan.          Nutrition Assessments:  MEDIFICTS Score Key: >=70 Need to make dietary changes  40-70 Heart Healthy Diet <= 40 Therapeutic Level Cholesterol Diet  Flowsheet Row Cardiac Rehab from 07/01/2024 in [facility] Cardiac and Pulmonary Rehab  Picture Your Plate Total Score on Admission 68   Picture Your Plate Scores: <59 Unhealthy dietary pattern with much room for improvement. 41-50 Dietary pattern unlikely to meet recommendations for good health and room for improvement. 51-60 More healthful dietary pattern, with some room for improvement.  >60 Healthy dietary pattern, although there may be some specific behaviors that could be improved.    Nutrition Goals Re-Evaluation:   Nutrition Goals Discharge (Final Nutrition Goals Re-Evaluation):   Psychosocial: Target Goals: Acknowledge presence or absence of significant depression and/or stress, maximize coping skills, provide positive support system. Participant is able to verbalize types and ability to use techniques and skills needed for reducing stress and depression.   Education: Stress, Anxiety, and Depression - Group verbal and visual presentation to define topics covered.  Reviews how body is impacted by stress, anxiety, and depression.  Also discusses healthy ways to reduce stress and to treat/manage anxiety and depression. Written material provided at class time.   Education: Sleep Hygiene -Provides group verbal and written instruction about how sleep can affect your health.  Define sleep hygiene, discuss sleep cycles and impact of sleep habits. Review good sleep hygiene tips.   Initial Review & Psychosocial Screening:  Initial Psych Review & Screening - 07/01/24  0946       Initial Review  Current issues with Current Sleep Concerns      Family Dynamics   Good Support System? Yes      Barriers   Psychosocial barriers to participate in program There are no identifiable barriers or psychosocial needs.      Screening Interventions   Interventions Encouraged to exercise;Provide feedback about the scores to participant    Expected Outcomes Short Term goal: Utilizing psychosocial counselor, staff and physician to assist with identification of specific Stressors or current issues interfering with healing process. Setting desired goal for each stressor or current issue identified.;Long Term Goal: Stressors or current issues are controlled or eliminated.;Short Term goal: Identification and review with participant of any Quality of Life or Depression concerns found by scoring the questionnaire.;Long Term goal: The participant improves quality of Life and PHQ9 Scores as seen by post scores and/or verbalization of changes          Quality of Life Scores:   Quality of Life - 07/01/24 1338       Quality of Life   Select Quality of Life      Quality of Life Scores   Health/Function Pre 16.12 %    Socioeconomic Pre 21.17 %    Psych/Spiritual Pre 21.21 %    Family Pre 27.6 %    GLOBAL Pre 20.1 %         Scores of 19 and below usually indicate a poorer quality of life in these areas.  A difference of  2-3 points is a clinically meaningful difference.  A difference of 2-3 points in the total score of the Quality of Life Index has been associated with significant improvement in overall quality of life, self-image, physical symptoms, and general health in studies assessing change in quality of life.  PHQ-9: Review Flowsheet        No data to display         Interpretation of Total Score  Total Score Depression Severity:  1-4 = Minimal depression, 5-9 = Mild depression, 10-14 = Moderate depression, 15-19 = Moderately severe depression, 20-27 =  Severe depression   Psychosocial Evaluation and Intervention:  Psychosocial Evaluation - 07/01/24 1200       Psychosocial Evaluation & Interventions   Interventions Encouraged to exercise with the program and follow exercise prescription;Stress management education;Relaxation education    Comments Eligha is coming to cardiac rehab post CABG. He states he has been healing well and is ready to start getting closer to getting back to the golf course. His sleep has been a recent concern post CABG and then two weeks after his surgery he had to have a pacemaker implanted. His wife helps him manage his health care, she is a Engineer, civil (consulting). He enjoys going to his beach place and golfing. He had a trip planned to [place], but had to be postponed until May because of his heart surgery. He notes that he would not have been able to walk and keep up had he gone prior to having surgery, so he is thankful they didn't go and is looking forward to the new trip dates because he feels like he will be ready to enjoy the trip more. He also plans on being in his son's wedding in April and that is a big motivation for him to get stronger. He reports no stress concerns and is ready to start the program    Expected Outcomes Short: attend cardiac rehab for education and exercise Long: develop and maintain positive self care habits  Continue Psychosocial Services  Follow up required by staff          Psychosocial Re-Evaluation:   Psychosocial Discharge (Final Psychosocial Re-Evaluation):   Vocational Rehabilitation: Provide vocational rehab assistance to qualifying candidates.   Vocational Rehab Evaluation & Intervention:  Vocational Rehab - 07/01/24 0945       Initial Vocational Rehab Evaluation & Intervention   Assessment shows need for Vocational Rehabilitation No          Education: Education Goals: Education classes will be provided on a variety of topics geared toward better understanding of heart health and  risk factor modification. Participant will state understanding/return demonstration of topics presented as noted by education test scores.  Learning Barriers/Preferences:  Learning Barriers/Preferences - 07/01/24 0942       Learning Barriers/Preferences   Learning Barriers None    Learning Preferences Individual Instruction          General Cardiac Education Topics:  AED/CPR: - Group verbal and written instruction with the use of models to demonstrate the basic use of the AED with the basic ABC's of resuscitation.   Test and Procedures: - Group verbal and visual presentation and models provide information about basic cardiac anatomy and function. Reviews the testing methods done to diagnose heart disease and the outcomes of the test results. Describes the treatment choices: Medical Management, Angioplasty, or Coronary Bypass Surgery for treating various heart conditions including Myocardial Infarction, Angina, Valve Disease, and Cardiac Arrhythmias. Written material provided at class time.   Medication Safety: - Group verbal and visual instruction to review commonly prescribed medications for heart and lung disease. Reviews the medication, class of the drug, and side effects. Includes the steps to properly store meds and maintain the prescription regimen. Written material provided at class time.   Intimacy: - Group verbal instruction through game format to discuss how heart and lung disease can affect sexual intimacy. Written material provided at class time.   Know Your Numbers and Heart Failure: - Group verbal and visual instruction to discuss disease risk factors for cardiac and pulmonary disease and treatment options.  Reviews associated critical values for Overweight/Obesity, Hypertension, Cholesterol, and Diabetes.  Discusses basics of heart failure: signs/symptoms and treatments.  Introduces Heart Failure Zone chart for action plan for heart failure. Written material provided  at class time.   Infection Prevention: - Provides verbal and written material to individual with discussion of infection control including proper hand washing and proper equipment cleaning during exercise session. Flowsheet Row Cardiac Rehab from 07/17/2024 in [facility] Cardiac and Pulmonary Rehab  Date 07/01/24  Educator [facility]  Instruction Review Code 1- Verbalizes Understanding    Falls Prevention: - Provides verbal and written material to individual with discussion of falls prevention and safety. Flowsheet Row Cardiac Rehab from 07/17/2024 in [facility] Cardiac and Pulmonary Rehab  Date 07/01/24  Educator [facility]  Instruction Review Code 1- Verbalizes Understanding    Other: -Provides group and verbal instruction on various topics (see comments)   Knowledge Questionnaire Score:  Knowledge Questionnaire Score - 07/01/24 1338       Knowledge Questionnaire Score   Pre Score 25/26          Core Components/Risk Factors/Patient Goals at Admission:  Personal Goals and Risk Factors at Admission - 07/01/24 0940       Core Components/Risk Factors/Patient Goals on Admission    Weight Management Yes;Weight Loss    Intervention Weight Management: Develop a combined nutrition and exercise program designed to reach desired caloric intake,  while maintaining appropriate intake of nutrient and fiber, sodium and fats, and appropriate energy expenditure required for the weight goal.;Weight Management: Provide education and appropriate resources to help participant work on and attain dietary goals.;Weight Management/Obesity: Establish reasonable short term and long term weight goals.;Obesity: Provide education and appropriate resources to help participant work on and attain dietary goals.    Admit Weight 255 lb (115.7 kg)    Goal Weight: Long Term 225 lb (102.1 kg)    Expected Outcomes Short Term: Continue to assess and modify interventions until short term weight is achieved;Long Term: Adherence to nutrition  and physical activity/exercise program aimed toward attainment of established weight goal;Weight Loss: Understanding of general recommendations for a balanced deficit meal plan, which promotes 1-2 lb weight loss per week and includes a negative energy balance of [phone] kcal/d;Understanding recommendations for meals to include 15-35% energy as protein, 25-35% energy from fat, 35-60% energy from carbohydrates, less than 200mg  of dietary cholesterol, 20-35 gm of total fiber daily;Understanding of distribution of calorie intake throughout the day with the consumption of 4-5 meals/snacks    Diabetes Yes    Intervention Provide education about signs/symptoms and action to take for hypo/hyperglycemia.;Provide education about proper nutrition, including hydration, and aerobic/resistive exercise prescription along with prescribed medications to achieve blood glucose in normal ranges: Fasting glucose 65-99 mg/dL    Expected Outcomes Short Term: Participant verbalizes understanding of the signs/symptoms and immediate care of hyper/hypoglycemia, proper foot care and importance of medication, aerobic/resistive exercise and nutrition plan for blood glucose control.;Long Term: Attainment of HbA1C < 7%.    Hypertension Yes    Intervention Monitor prescription use compliance.;Provide education on lifestyle modifcations including regular physical activity/exercise, weight management, moderate sodium restriction and increased consumption of fresh fruit, vegetables, and low fat dairy, alcohol moderation, and smoking cessation.    Expected Outcomes Short Term: Continued assessment and intervention until BP is < 140/2mm HG in hypertensive participants. < 130/74mm HG in hypertensive participants with diabetes, heart failure or chronic kidney disease.;Long Term: Maintenance of blood pressure at goal levels.    Lipids Yes    Intervention Provide education and support for participant on nutrition & aerobic/resistive exercise along  with prescribed medications to achieve LDL 70mg , HDL >40mg .    Expected Outcomes Short Term: Participant states understanding of desired cholesterol values and is compliant with medications prescribed. Participant is following exercise prescription and nutrition guidelines.;Long Term: Cholesterol controlled with medications as prescribed, with individualized exercise RX and with personalized nutrition plan. Value goals: LDL < 70mg , HDL > 40 mg.          Education:Diabetes - Individual verbal and written instruction to review signs/symptoms of diabetes, desired ranges of glucose level fasting, after meals and with exercise. Acknowledge that pre and post exercise glucose checks will be done for 3 sessions at entry of program. Flowsheet Row Cardiac Rehab from 07/17/2024 in [facility] Cardiac and Pulmonary Rehab  Date 07/01/24  Educator [facility]  Instruction Review Code 1- Verbalizes Understanding    Core Components/Risk Factors/Patient Goals Review:    Core Components/Risk Factors/Patient Goals at Discharge (Final Review):    ITP Comments:  ITP Comments     Row Name 07/01/24 1208 07/17/24 0752 07/17/24 1021       ITP Comments Initital orientation and 6 MWT completed.  Initial ITP created and sent for review to Dr. Oneil Pinal, Medical Director. First full day of exercise!  Patient was oriented to gym and equipment including functions, settings, policies, and procedures.  Patient's  individual exercise prescription and treatment plan were reviewed.  All starting workloads were established based on the results of the 6 minute walk test done at initial orientation visit.  The plan for exercise progression was also introduced and progression will be customized based on patient's performance and goals. 30 Day review completed. Medical Director ITP review done; changes made as directed and signed approval by Medical Director. New to program.        Comments: 30 day review

## 2024-07-19 ENCOUNTER — Encounter

## 2024-07-19 DIAGNOSIS — Z951 Presence of aortocoronary bypass graft: Secondary | ICD-10-CM

## 2024-07-19 LAB — GLUCOSE, CAPILLARY
Glucose-Capillary: 154 mg/dL — ABNORMAL HIGH (ref 70–99)
Glucose-Capillary: 172 mg/dL — ABNORMAL HIGH (ref 70–99)

## 2024-07-19 NOTE — Progress Notes (Signed)
 Daily Session Note  Patient Details  Name: Chase Harris MRN: [id number] Date of Birth: 01-28-1961 Referring Provider:   Flowsheet Row Cardiac Rehab from 07/01/2024 in [facility] Cardiac and Pulmonary Rehab  Referring Provider Dr. Evalene Lunger, MD    Encounter Date: 07/19/2024  Check In:  Session Check In - 07/19/24 1109       Check-In   Supervising physician immediately available to respond to emergencies See telemetry face sheet for immediately available ER MD    Location [facility]-Cardiac & Pulmonary Rehab    Staff Present Devaughn Jaeger, BS, Exercise Physiologist;Wille Aubuchon RN,BSN;Joseph [facility] BS, Exercise Physiologist    Virtual Visit No    Medication changes reported     No    Fall or balance concerns reported    No    Warm-up and Cool-down Performed on first and last piece of equipment    Resistance Training Performed Yes    VAD Patient? No    PAD/SET Patient? No      Pain Assessment   Currently in Pain? No/denies             Social History   Tobacco Use  Smoking Status Former  Smokeless Tobacco Never    Goals Met:  Independence with exercise equipment Exercise tolerated well No report of concerns or symptoms today Strength training completed today  Goals Unmet:  Not Applicable  Comments: Pt able to follow exercise prescription today without complaint.  Will continue to monitor for progression.    Dr. Oneil Pinal is Medical Director for [facility] Cardiac Rehabilitation.  Dr. Fuad Aleskerov is Medical Director for [facility] Pulmonary Rehabilitation.

## 2024-07-22 ENCOUNTER — Encounter

## 2024-07-22 DIAGNOSIS — Z951 Presence of aortocoronary bypass graft: Secondary | ICD-10-CM | POA: Diagnosis not present

## 2024-07-22 LAB — GLUCOSE, CAPILLARY
Glucose-Capillary: 147 mg/dL — ABNORMAL HIGH (ref 70–99)
Glucose-Capillary: 140 mg/dL — ABNORMAL HIGH (ref 70–99)

## 2024-07-22 NOTE — Progress Notes (Signed)
 Daily Session Note  Patient Details  Name: Chase Harris MRN: [id number] Date of Birth: [birth year] Referring Provider:   Flowsheet Row Cardiac Rehab from 07/01/2024 in [facility] Cardiac and Pulmonary Rehab  Referring Provider Dr. Evalene Lunger, MD    Encounter Date: 07/22/2024  Check In:  Session Check In - 07/22/24 0809       Check-In   Supervising physician immediately available to respond to emergencies See telemetry face sheet for immediately available ER MD    Location [facility]-Cardiac & Pulmonary Rehab    Staff Present Burnard Davenport RN,BSN,MPA;Joseph [facility] Dyane BS, ACSM CEP, Exercise Physiologist;Jason Elnor RDN,LDN    Virtual Visit No    Medication changes reported     No    Fall or balance concerns reported    No    Warm-up and Cool-down Performed on first and last piece of equipment    Resistance Training Performed Yes    VAD Patient? No    PAD/SET Patient? No      Pain Assessment   Currently in Pain? No/denies             Social History   Tobacco Use  Smoking Status Former  Smokeless Tobacco Never    Goals Met:  Independence with exercise equipment Exercise tolerated well No report of concerns or symptoms today Strength training completed today  Goals Unmet:  Not Applicable  Comments: Pt able to follow exercise prescription today without complaint.  Will continue to monitor for progression.    Dr. Oneil Pinal is Medical Director for [facility] Cardiac Rehabilitation.  Dr. Fuad Aleskerov is Medical Director for [facility] Pulmonary Rehabilitation.

## 2024-07-24 ENCOUNTER — Encounter

## 2024-07-26 ENCOUNTER — Encounter

## 2024-07-29 ENCOUNTER — Encounter: Attending: Cardiovascular Disease | Admitting: *Deleted

## 2024-07-29 DIAGNOSIS — Z951 Presence of aortocoronary bypass graft: Secondary | ICD-10-CM | POA: Diagnosis present

## 2024-07-29 NOTE — Progress Notes (Signed)
 Daily Session Note  Patient Details  Name: Chase Harris MRN: [id number] Date of Birth: 1961/04/14 Referring Provider:   Flowsheet Row Cardiac Rehab from 07/01/2024 in [facility] Cardiac and Pulmonary Rehab  Referring Provider Dr. Evalene Lunger, MD    Encounter Date: 07/29/2024  Check In:  Session Check In - 07/29/24 0741       Check-In   Supervising physician immediately available to respond to emergencies See telemetry face sheet for immediately available ER MD    Location [facility]-Cardiac & Pulmonary Rehab    Staff Present Othel Durand, RN, BSN, CCRP;Jason Elnor RDN,LDN;Kelly Dyane BS, ACSM CEP, Exercise Physiologist;Joseph Rolinda RCP,RRT,BSRT    Virtual Visit No    Medication changes reported     No    Fall or balance concerns reported    No    Warm-up and Cool-down Performed on first and last piece of equipment    Resistance Training Performed Yes    VAD Patient? No    PAD/SET Patient? No      Pain Assessment   Currently in Pain? No/denies             Social History   Tobacco Use  Smoking Status Former  Smokeless Tobacco Never    Goals Met:  Independence with exercise equipment Exercise tolerated well No report of concerns or symptoms today  Goals Unmet:  Not Applicable  Comments: Pt able to follow exercise prescription today without complaint.  Will continue to monitor for progression.    Dr. Oneil Pinal is Medical Director for [facility] Cardiac Rehabilitation.  Dr. Fuad Aleskerov is Medical Director for [facility] Pulmonary Rehabilitation.

## 2024-07-31 ENCOUNTER — Encounter

## 2024-08-01 ENCOUNTER — Ambulatory Visit

## 2024-08-01 DIAGNOSIS — I442 Atrioventricular block, complete: Secondary | ICD-10-CM | POA: Diagnosis not present

## 2024-08-01 LAB — CUP PACEART REMOTE DEVICE CHECK
Battery Remaining Longevity: 119 mo
Battery Remaining Percentage: 95.5 %
Battery Voltage: 3.04 V
Brady Statistic AP VP Percent: 1 %
Brady Statistic AP VS Percent: 1 %
Brady Statistic AS VP Percent: 96 %
Brady Statistic AS VS Percent: 3.4 %
Brady Statistic RA Percent Paced: 1 %
Brady Statistic RV Percent Paced: 97 %
Date Time Interrogation Session: 20251106020020
Implantable Lead Connection Status: 753985
Implantable Lead Connection Status: 753985
Implantable Lead Implant Date: 20250923
Implantable Lead Implant Date: 20250923
Implantable Lead Location: 753859
Implantable Lead Location: 753860
Implantable Pulse Generator Implant Date: 20250923
Lead Channel Impedance Value: 440 Ohm
Lead Channel Impedance Value: 540 Ohm
Lead Channel Pacing Threshold Amplitude: 0.375 V
Lead Channel Pacing Threshold Amplitude: 1.125 V
Lead Channel Pacing Threshold Pulse Width: 0.5 ms
Lead Channel Pacing Threshold Pulse Width: 0.5 ms
Lead Channel Sensing Intrinsic Amplitude: 12 mV
Lead Channel Sensing Intrinsic Amplitude: 2.1 mV
Lead Channel Setting Pacing Amplitude: 1.375
Lead Channel Setting Pacing Amplitude: 1.375
Lead Channel Setting Pacing Pulse Width: 0.5 ms
Lead Channel Setting Sensing Sensitivity: 2 mV
Pulse Gen Model: 2272
Pulse Gen Serial Number: 5291451

## 2024-08-02 ENCOUNTER — Encounter

## 2024-08-05 ENCOUNTER — Encounter

## 2024-08-05 DIAGNOSIS — Z951 Presence of aortocoronary bypass graft: Secondary | ICD-10-CM

## 2024-08-05 NOTE — Progress Notes (Signed)
 Daily Session Note  Patient Details  Name: ZAYVEON RASCHKE MRN: [id number] Date of Birth: 11-Jun-1961 Referring Provider:   Flowsheet Row Cardiac Rehab from 07/01/2024 in [facility] Cardiac and Pulmonary Rehab  Referring Provider Dr. Evalene Lunger, MD    Encounter Date: 08/05/2024  Check In:  Session Check In - 08/05/24 0923       Check-In   Supervising physician immediately available to respond to emergencies See telemetry face sheet for immediately available ER MD    Location [facility]-Cardiac & Pulmonary Rehab    Staff Present Burnard Hint BS, ACSM CEP, Exercise Physiologist;Maxon Burnell HECKLE, Exercise Physiologist;Joseph Hood RCP,RRT,BSRT;Evette Diclemente RN,BSN,MPA;Mary Peggi, RN, DNP, NE-BC    Virtual Visit No    Medication changes reported     No    Fall or balance concerns reported    No    Warm-up and Cool-down Performed on first and last piece of equipment    Resistance Training Performed Yes    VAD Patient? No    PAD/SET Patient? No      Pain Assessment   Currently in Pain? No/denies             Social History   Tobacco Use  Smoking Status Former  Smokeless Tobacco Never    Goals Met:  Independence with exercise equipment Exercise tolerated well Personal goals reviewed No report of concerns or symptoms today Strength training completed today  Goals Unmet:  Not Applicable  Comments: Pt able to follow exercise prescription today without complaint.  Will continue to monitor for progression.  Cardiac:  Reviewed home exercise with pt today.  Pt plans to go to [employer] for exercise.  Reviewed THR, pulse, RPE, sign and symptoms, pulse oximetery and when to call 911 or MD.  Also discussed weather considerations and indoor options.  Pt voiced understanding.   Dr. Oneil Pinal is Medical Director for [facility] Cardiac Rehabilitation.  Dr. Fuad Aleskerov is Medical Director for [facility] Pulmonary Rehabilitation.

## 2024-08-06 ENCOUNTER — Ambulatory Visit: Payer: Self-pay | Admitting: Cardiology

## 2024-08-06 NOTE — Progress Notes (Signed)
 Remote PPM Transmission

## 2024-08-07 ENCOUNTER — Encounter

## 2024-08-09 ENCOUNTER — Encounter

## 2024-08-12 ENCOUNTER — Encounter

## 2024-08-12 DIAGNOSIS — Z951 Presence of aortocoronary bypass graft: Secondary | ICD-10-CM

## 2024-08-12 NOTE — Progress Notes (Signed)
 Daily Session Note  Patient Details  Name: Chase Harris MRN: [id number] Date of Birth: 1961/09/03 Referring Provider:   Flowsheet Row Cardiac Rehab from 07/01/2024 in [facility] Cardiac and Pulmonary Rehab  Referring Provider Dr. Evalene Lunger, MD    Encounter Date: 08/12/2024  Check In:  Session Check In - 08/12/24 0911       Check-In   Supervising physician immediately available to respond to emergencies See telemetry face sheet for immediately available ER MD    Location [facility]-Cardiac & Pulmonary Rehab    Staff Present Burnard Davenport RN,BSN,MPA;Laura Cates RN,BSN;Joseph [facility] BS, ACSM CEP, Exercise Physiologist    Virtual Visit No    Medication changes reported     No    Fall or balance concerns reported    No    Warm-up and Cool-down Performed on first and last piece of equipment    Resistance Training Performed Yes    VAD Patient? No    PAD/SET Patient? No      Pain Assessment   Currently in Pain? No/denies             Social History   Tobacco Use  Smoking Status Former  Smokeless Tobacco Never    Goals Met:  Independence with exercise equipment Exercise tolerated well No report of concerns or symptoms today Strength training completed today  Goals Unmet:  Not Applicable  Comments: Pt able to follow exercise prescription today without complaint.  Will continue to monitor for progression.    Dr. Oneil Pinal is Medical Director for [facility] Cardiac Rehabilitation.  Dr. Fuad Aleskerov is Medical Director for [facility] Pulmonary Rehabilitation.

## 2024-08-14 ENCOUNTER — Encounter

## 2024-08-14 ENCOUNTER — Encounter: Admitting: *Deleted

## 2024-08-14 DIAGNOSIS — Z951 Presence of aortocoronary bypass graft: Secondary | ICD-10-CM | POA: Diagnosis not present

## 2024-08-14 NOTE — Progress Notes (Signed)
 Cardiac Individual Treatment Plan  Patient Details  Name: Chase Harris MRN: [id number] Date of Birth: 1960/11/20 Referring Provider:   Flowsheet Row Cardiac Rehab from 07/01/2024 in [facility] Cardiac and Pulmonary Rehab  Referring Provider Dr. Evalene Lunger, MD    Initial Encounter Date:  Flowsheet Row Cardiac Rehab from 07/01/2024 in [facility] Cardiac and Pulmonary Rehab  Date 07/01/24    Visit Diagnosis: S/P CABG x 4  Patient's Home Medications on Admission:  Current Outpatient Medications:    acetaminophen  (TYLENOL ) 500 MG tablet, Take 1,500 mg by mouth 2 (two) times daily as needed for headache or fever (pain)., Disp: , Rfl:    allopurinol (ZYLOPRIM) 300 MG tablet, Take 300 mg by mouth daily., Disp: , Rfl:    ELIQUIS 5 MG TABS tablet, Take 1 tablet (5 mg total) by mouth 2 (two) times daily., Disp: 180 tablet, Rfl: 3   etanercept (ENBREL) 50 MG/ML injection, Inject 50 mg into the skin every Wednesday., Disp: , Rfl:    Evolocumab (REPATHA) 140 MG/ML SOSY, Inject 140 mg into the skin See admin instructions. Inject 1mL (140mg ) into the skin every other Thursday., Disp: 2.1 mL, Rfl: 3   FARXIGA 10 MG TABS tablet, Take 1 tablet (10 mg total) by mouth daily., Disp: 90 tablet, Rfl: 3   fenofibrate 160 MG tablet, Take 160 mg by mouth daily., Disp: , Rfl:    fluticasone (FLONASE) 50 MCG/ACT nasal spray, Place 2 sprays into the nose daily as needed for allergies or rhinitis. , Disp: , Rfl:    furosemide  (LASIX ) 20 MG tablet, Take 1 tablet (20 mg total) by mouth daily., Disp: 90 tablet, Rfl: 3   gabapentin  (NEURONTIN ) 600 MG tablet, TAKE ONE TABLET (600 MG TOTAL) BY MOUTH THREE TIMES DAILY., Disp: 270 tablet, Rfl: 0   hydroxychloroquine (PLAQUENIL) 200 MG tablet, Take 200 mg by mouth 2 (two) times daily., Disp: , Rfl:    imipramine (TOFRANIL) 50 MG tablet, Take 50 mg by mouth at bedtime., Disp: , Rfl:    insulin  aspart (FIASP  FLEXTOUCH) 100 UNIT/ML FlexTouch Pen, Inject 45 Units into the skin with  breakfast, with lunch, and with evening meal., Disp: , Rfl:    insulin  degludec (TRESIBA FLEXTOUCH) 100 UNIT/ML FlexTouch Pen, Inject 140 Units into the skin at bedtime., Disp: , Rfl:    loratadine (CLARITIN) 10 MG tablet, Take 10 mg by mouth daily. , Disp: , Rfl:    metFORMIN (GLUCOPHAGE) 1000 MG tablet, Take 1,000 mg by mouth 2 (two) times a day. , Disp: , Rfl:    metoprolol  tartrate (LOPRESSOR ) 50 MG tablet, Take 1 tablet (50 mg total) by mouth 2 (two) times daily., Disp: 180 tablet, Rfl: 3   Multiple Vitamins-Minerals (MULTIVITAMIN MEN 50+) TABS, Take 1 tablet by mouth daily., Disp: , Rfl:    nystatin  (MYCOSTATIN ) 100000 UNIT/ML suspension, Take 4 mLs by mouth 4 (four) times daily. 10 day course, Disp: , Rfl:    omeprazole (PRILOSEC) 40 MG capsule, Take 40 mg by mouth daily., Disp: , Rfl:    oxyCODONE  (OXY IR/ROXICODONE ) 5 MG immediate release tablet, Take 5 mg by mouth every 4 (four) hours as needed for severe pain (pain score 7-10)., Disp: , Rfl:    predniSONE (DELTASONE) 2.5 MG tablet, Take 2.5 mg by mouth daily with breakfast., Disp: , Rfl:    spironolactone  (ALDACTONE ) 25 MG tablet, Take 1 tablet (25 mg total) by mouth daily., Disp: 90 tablet, Rfl: 3  Past Medical History: Past Medical History:  Diagnosis Date  Arthritis    Chronic kidney disease    Constipation    Diabetes mellitus without complication (HCC)    Metformin   Diabetic peripheral neuropathy (HCC)    GERD (gastroesophageal reflux disease)    History of prostate cancer    Hypertension    Peripheral vascular disease    Prostate cancer (HCC)    Sleep apnea    has cpap   Stroke [facility])    mini stroke 2016 - no deficitis    Tobacco Use: Social History   Tobacco Use  Smoking Status Former  Smokeless Tobacco Never    Labs: Review Flowsheet       Latest Ref Rng & Units 03/06/2013 04/07/2019 01/31/2023 06/17/2024  Labs for ITP Cardiac and Pulmonary Rehab  Cholestrol 0 - 200 mg/dL 824  - - 97   LDL (calc) 0 - 99  mg/dL 93  - - 29   HDL-C >59 mg/dL 34  - - 43   Trlycerides <150 mg/dL 761  - - 876   Hemoglobin A1c 4.8 - 5.6 % 10.3  8.2  7.3  7.4      Exercise Target Goals: Exercise Program Goal: Individual exercise prescription set using results from initial 6 min walk test and THRR while considering  patient's activity barriers and safety.   Exercise Prescription Goal: Initial exercise prescription builds to 30-45 minutes a day of aerobic activity, 2-3 days per week.  Home exercise guidelines will be given to patient during program as part of exercise prescription that the participant will acknowledge.   Education: Aerobic Exercise: - Group verbal and visual presentation on the components of exercise prescription. Introduces F.I.T.T principle from ACSM for exercise prescriptions.  Reviews F.I.T.T. principles of aerobic exercise including progression. Written material provided at class time.   Education: Resistance Exercise: - Group verbal and visual presentation on the components of exercise prescription. Introduces F.I.T.T principle from ACSM for exercise prescriptions  Reviews F.I.T.T. principles of resistance exercise including progression. Written material provided at class time.    Education: Exercise & Equipment Safety: - Individual verbal instruction and demonstration of equipment use and safety with use of the equipment. Flowsheet Row Cardiac Rehab from 08/14/2024 in [facility] Cardiac and Pulmonary Rehab  Date 07/01/24  Educator [facility]  Instruction Review Code 1- Verbalizes Understanding    Education: Exercise Physiology & General Exercise Guidelines: - Group verbal and written instruction with models to review the exercise physiology of the cardiovascular system and associated critical values. Provides general exercise guidelines with specific guidelines to those with heart or lung disease. Written material provided at class time.   Education: Flexibility, Balance, Mind/Body Relaxation: -  Group verbal and visual presentation with interactive activity on the components of exercise prescription. Introduces F.I.T.T principle from ACSM for exercise prescriptions. Reviews F.I.T.T. principles of flexibility and balance exercise training including progression. Also discusses the mind body connection.  Reviews various relaxation techniques to help reduce and manage stress (i.e. Deep breathing, progressive muscle relaxation, and visualization). Balance handout provided to take home. Written material provided at class time.   Activity Barriers & Risk Stratification:  Activity Barriers & Cardiac Risk Stratification - 07/01/24 1330       Activity Barriers & Cardiac Risk Stratification   Activity Barriers Left Knee Replacement;Joint Problems;Deconditioning;Arthritis;Balance Concerns;Other (comment)    Comments Plantar Fasciitis    Cardiac Risk Stratification High          6 Minute Walk:  6 Minute Walk     Row Name 07/01/24 1329  6 Minute Walk   Phase Initial     Distance 1005 feet     Walk Time 6 minutes     # of Rest Breaks 0     MPH 1.9     METS 2.28     RPE 13     Perceived Dyspnea  0     VO2 Peak 7.99     Symptoms Yes (comment)     Comments lightheadedness     Resting HR 74 bpm     Resting BP 126/68     Resting Oxygen Saturation  96 %     Exercise Oxygen Saturation  during 6 min walk 96 %     Max Ex. HR 101 bpm     Max Ex. BP 152/68     2 Minute Post BP 134/66        Oxygen Initial Assessment:   Oxygen Re-Evaluation:   Oxygen Discharge (Final Oxygen Re-Evaluation):   Initial Exercise Prescription:  Initial Exercise Prescription - 07/01/24 1300       Date of Initial Exercise RX and Referring Provider   Date 07/01/24    Referring Provider Dr. Timothy Gollan, MD      Oxygen   Maintain Oxygen Saturation 88% or higher      Treadmill   MPH 2    Grade 0    Minutes 15    METs 2.53      NuStep   Level 2   T6 nustep   SPM 80    Minutes 15     METs 2.28      REL-XR   Level 2    Speed 50    Minutes 15    METs 2.28      Prescription Details   Frequency (times per week) 3    Duration Progress to 30 minutes of continuous aerobic without signs/symptoms of physical distress      Intensity   THRR 40-80% of Max Heartrate 106-139    Ratings of Perceived Exertion 11-13    Perceived Dyspnea 0-4      Progression   Progression Continue to progress workloads to maintain intensity without signs/symptoms of physical distress.      Resistance Training   Training Prescription Yes    Weight 5 lb    Reps 10-15          Perform Capillary Blood Glucose checks as needed.  Exercise Prescription Changes:   Exercise Prescription Changes     Row Name 07/01/24 1300 07/30/24 1500 08/05/24 0900         Response to Exercise   Blood Pressure (Admit) 126/68 120/72 --     Blood Pressure (Exercise) 152/68 144/72 --     Blood Pressure (Exit) 134/66 102/60 --     Heart Rate (Admit) 74 bpm 94 bpm --     Heart Rate (Exercise) 101 bpm 123 bpm --     Heart Rate (Exit) 83 bpm 95 bpm --     Oxygen Saturation (Admit) 96 % -- --     Oxygen Saturation (Exercise) 96 % -- --     Rating of Perceived Exertion (Exercise) 13 14 --     Perceived Dyspnea (Exercise) 0 -- --     Symptoms lightheadedness none --     Comments Results 1st 2 weeks of exercise sessions --     Duration -- Progress to 30 minutes of  aerobic without signs/symptoms of physical distress Progress to 30 minutes of  aerobic  without signs/symptoms of physical distress     Intensity -- THRR unchanged THRR unchanged       Progression   Progression -- Continue to progress workloads to maintain intensity without signs/symptoms of physical distress. Continue to progress workloads to maintain intensity without signs/symptoms of physical distress.     Average METs -- 2.31 2.31       Resistance Training   Training Prescription -- Yes Yes     Weight -- 5 lb 5 lb     Reps -- 10-15  10-15       Interval Training   Interval Training -- No No       Treadmill   MPH -- 2 2     Grade -- 0 0     Minutes -- 15 15     METs -- 2.53 2.53       Recumbant Bike   Level -- 3 3     RPM -- 50 50     Watts -- 19 19     Minutes -- 15 15     METs -- 2.51 2.51       REL-XR   Level -- 2 2     Minutes -- 15 15     METs -- 2.3 2.3       T5 Nustep   Level -- 2 2     SPM -- 80 80     Minutes -- 15 15     METs -- 2 2       Home Exercise Plan   Plans to continue exercise at -- -- [employer] (comment)  [employer]     Frequency -- -- Add 2 additional days to program exercise sessions.     Initial Home Exercises Provided -- -- 08/05/24       Oxygen   Maintain Oxygen Saturation -- 88% or higher 88% or higher        Exercise Comments:   Exercise Comments     Row Name 07/17/24 9247           Exercise Comments First full day of exercise!  Patient was oriented to gym and equipment including functions, settings, policies, and procedures.  Patient's individual exercise prescription and treatment plan were reviewed.  All starting workloads were established based on the results of the 6 minute walk test done at initial orientation visit.  The plan for exercise progression was also introduced and progression will be customized based on patient's performance and goals.          Exercise Goals and Review:   Exercise Goals     Row Name 07/01/24 1331             Exercise Goals   Increase Physical Activity Yes       Intervention Develop an individualized exercise prescription for aerobic and resistive training based on initial evaluation findings, risk stratification, comorbidities and participant's personal goals.;Provide advice, education, support and counseling about physical activity/exercise needs.       Expected Outcomes Short Term: Attend rehab on a regular basis to increase amount of physical activity.;Long Term: Add in home exercise to make exercise part  of routine and to increase amount of physical activity.;Long Term: Exercising regularly at least 3-5 days a week.       Increase Strength and Stamina Yes       Intervention Provide advice, education, support and counseling about physical activity/exercise needs.;Develop an individualized exercise prescription for aerobic and resistive training  based on initial evaluation findings, risk stratification, comorbidities and participant's personal goals.       Expected Outcomes Short Term: Increase workloads from initial exercise prescription for resistance, speed, and METs.;Short Term: Perform resistance training exercises routinely during rehab and add in resistance training at home;Long Term: Improve cardiorespiratory fitness, muscular endurance and strength as measured by increased METs and functional capacity ( )       Able to understand and use rate of perceived exertion (RPE) scale Yes       Intervention Provide education and explanation on how to use RPE scale       Expected Outcomes Short Term: Able to use RPE daily in rehab to express subjective intensity level;Long Term:  Able to use RPE to guide intensity level when exercising independently       Able to understand and use Dyspnea scale Yes       Intervention Provide education and explanation on how to use Dyspnea scale       Expected Outcomes Short Term: Able to use Dyspnea scale daily in rehab to express subjective sense of shortness of breath during exertion;Long Term: Able to use Dyspnea scale to guide intensity level when exercising independently       Knowledge and understanding of Target Heart Rate Range (THRR) Yes       Intervention Provide education and explanation of THRR including how the numbers were predicted and where they are located for reference       Expected Outcomes Short Term: Able to state/look up THRR;Long Term: Able to use THRR to govern intensity when exercising independently;Short Term: Able to use daily as guideline  for intensity in rehab       Able to check pulse independently Yes       Intervention Provide education and demonstration on how to check pulse in carotid and radial arteries.;Review the importance of being able to check your own pulse for safety during independent exercise       Expected Outcomes Short Term: Able to explain why pulse checking is important during independent exercise;Long Term: Able to check pulse independently and accurately       Understanding of Exercise Prescription Yes       Intervention Provide education, explanation, and written materials on patient's individual exercise prescription       Expected Outcomes Short Term: Able to explain program exercise prescription;Long Term: Able to explain home exercise prescription to exercise independently          Exercise Goals Re-Evaluation :  Exercise Goals Re-Evaluation     Row Name 07/17/24 0752 07/30/24 1528 08/05/24 0941         Exercise Goal Re-Evaluation   Exercise Goals Review Increase Physical Activity;Able to understand and use rate of perceived exertion (RPE) scale;Knowledge and understanding of Target Heart Rate Range (THRR);Understanding of Exercise Prescription;Increase Strength and Stamina;Able to understand and use Dyspnea scale;Able to check pulse independently Increase Physical Activity;Understanding of Exercise Prescription;Increase Strength and Stamina Increase Physical Activity;Increase Strength and Stamina;Able to understand and use rate of perceived exertion (RPE) scale;Able to understand and use Dyspnea scale;Knowledge and understanding of Target Heart Rate Range (THRR);Able to check pulse independently;Understanding of Exercise Prescription     Comments Reviewed RPE and dyspnea scale, THR and program prescription with pt today.  Pt voiced understanding and was given a copy of goals to take home. Chase Harris is off to a good start in the program and completed his 1st 2 weeks of exercise sessions. He was able  to do a  speed of 2 mph on the treadmill with no incline. He also worked at level 3 on the recumbent bike, level 2 on the T5 nustep, and level 2 on the XR. We will continue to monitor his progress in the program. Reviewed home exercise with pt today.  Pt plans to go to [employer] for exercise.  Reviewed THR, pulse, RPE, sign and symptoms, pulse oximetery and when to call 911 or MD.  Also discussed weather considerations and indoor options.  Pt voiced understanding.     Expected Outcomes Short: Use RPE daily to regulate intensity. Long: Follow program prescription in THR. Short: Continue to follow current exercise prescription. Long: Continue exercise to improve strength and stamina. Short: add 1-2 days a week of exercise on off days of rehab. Long: maintain indpendent exercise routine upon graduation from cardiac rehab.        Discharge Exercise Prescription (Final Exercise Prescription Changes):  Exercise Prescription Changes - 08/05/24 0900       Response to Exercise   Duration Progress to 30 minutes of  aerobic without signs/symptoms of physical distress    Intensity THRR unchanged      Progression   Progression Continue to progress workloads to maintain intensity without signs/symptoms of physical distress.    Average METs 2.31      Resistance Training   Training Prescription Yes    Weight 5 lb    Reps 10-15      Interval Training   Interval Training No      Treadmill   MPH 2    Grade 0    Minutes 15    METs 2.53      Recumbant Bike   Level 3    RPM 50    Watts 19    Minutes 15    METs 2.51      REL-XR   Level 2    Minutes 15    METs 2.3      T5 Nustep   Level 2    SPM 80    Minutes 15    METs 2      Home Exercise Plan   Plans to continue exercise at [employer] (comment)   [employer]   Frequency Add 2 additional days to program exercise sessions.    Initial Home Exercises Provided 08/05/24      Oxygen   Maintain Oxygen Saturation 88% or higher           Nutrition:  Target Goals: Understanding of nutrition guidelines, daily intake of sodium 1500mg , cholesterol 200mg , calories 30% from fat and 7% or less from saturated fats, daily to have 5 or more servings of fruits and vegetables.  Education: Nutrition 1 -Group instruction provided by verbal, written material, interactive activities, discussions, models, and posters to present general guidelines for heart healthy nutrition including macronutrients, label reading, and promoting whole foods over processed counterparts. Education serves as pensions consultant of discussion of heart healthy eating for all. Written material provided at class time. Flowsheet Row Cardiac Rehab from 08/14/2024 in [facility] Cardiac and Pulmonary Rehab  Date 07/17/24  Educator jg  Instruction Review Code 1- Verbalizes Understanding     Education: Nutrition 2 -Group instruction provided by verbal, written material, interactive activities, discussions, models, and posters to present general guidelines for heart healthy nutrition including sodium, cholesterol, and saturated fat. Providing guidance of habit forming to improve blood pressure, cholesterol, and body weight. Written material provided at class time.  Biometrics:  Pre Biometrics - 07/01/24 1331       Pre Biometrics   Height 5' 9 (1.753 m)    Weight 255 lb 4.8 oz (115.8 kg)    Waist Circumference 48 inches    Hip Circumference 44 inches    Waist to Hip Ratio 1.09 %    BMI (Calculated) 37.68    Single Leg Stand 4.5 seconds           Nutrition Therapy Plan and Nutrition Goals:  Nutrition Therapy & Goals - 07/01/24 1341       Nutrition Therapy   Protein (specify units) 80    Fiber 30 grams    Whole Grain Foods 3 servings    Saturated Fats 15 max. grams    Fruits and Vegetables 5 servings/day    Sodium 2 grams      Personal Nutrition Goals   Nutrition Goal Read labels and reduce sodium intake to below 2300mg . Ideally 1500mg  per day.     Personal Goal #2 Reduce saturated fat, less than 12g per day. Replace bad fats for more heart healthy fats.    Personal Goal #3 Include more colorful produce, aim for 5-8 servings of fruits and veggies per day    Comments Patient drinking mostly unsweet tea, getting about 2 bottles of water in per day. He usually eat 3 meals per day. His wife is a [employer] and tries to help him eat heart healthy and keep his DM under control. Spoke with about eating carb smart versus eating for heart health. Reviewed mediterranean diet handout, provided guidelines of less than 1500mg  sodium and less than 12g saturated fat per day, Educated on types of fats,sources, and how to read facts labels. Brainstormed a few meals with a focus on reducing sodium and saturated fat and including more colorful produce. A chicken pasta with side salad as an example with half as much sodium and saturated fat as the country ham sandwich while still the same amount of calories and carbs.      Intervention Plan   Intervention Prescribe, educate and counsel regarding individualized specific dietary modifications aiming towards targeted core components such as weight, hypertension, lipid management, diabetes, heart failure and other comorbidities.;Nutrition handout(s) given to patient.    Expected Outcomes Short Term Goal: Understand basic principles of dietary content, such as calories, fat, sodium, cholesterol and nutrients.;Short Term Goal: A plan has been developed with personal nutrition goals set during dietitian appointment.;Long Term Goal: Adherence to prescribed nutrition plan.          Nutrition Assessments:  MEDIFICTS Score Key: >=70 Need to make dietary changes  40-70 Heart Healthy Diet <= 40 Therapeutic Level Cholesterol Diet  Flowsheet Row Cardiac Rehab from 07/01/2024 in [facility] Cardiac and Pulmonary Rehab  Picture Your Plate Total Score on Admission 68   Picture Your Plate Scores: <59 Unhealthy dietary pattern with much room  for improvement. 41-50 Dietary pattern unlikely to meet recommendations for good health and room for improvement. 51-60 More healthful dietary pattern, with some room for improvement.  >60 Healthy dietary pattern, although there may be some specific behaviors that could be improved.    Nutrition Goals Re-Evaluation:  Nutrition Goals Re-Evaluation     Row Name 08/05/24 0943             Goals   Comment Chase Harris is getting more comfortable reading food labels and reports that he is watching sodium and saturated fats. He is doing well with eating more vegetables but  not so much fruit, but he states that is because he is also watching his blood sugars.       Expected Outcome Short:continue to get more comfortable reading food labels. Long: maintain heart healthy diet.          Nutrition Goals Discharge (Final Nutrition Goals Re-Evaluation):  Nutrition Goals Re-Evaluation - 08/05/24 0943       Goals   Comment Chase Harris is getting more comfortable reading food labels and reports that he is watching sodium and saturated fats. He is doing well with eating more vegetables but not so much fruit, but he states that is because he is also watching his blood sugars.    Expected Outcome Short:continue to get more comfortable reading food labels. Long: maintain heart healthy diet.          Psychosocial: Target Goals: Acknowledge presence or absence of significant depression and/or stress, maximize coping skills, provide positive support system. Participant is able to verbalize types and ability to use techniques and skills needed for reducing stress and depression.   Education: Stress, Anxiety, and Depression - Group verbal and visual presentation to define topics covered.  Reviews how body is impacted by stress, anxiety, and depression.  Also discusses healthy ways to reduce stress and to treat/manage anxiety and depression. Written material provided at class time.   Education: Sleep  Hygiene -Provides group verbal and written instruction about how sleep can affect your health.  Define sleep hygiene, discuss sleep cycles and impact of sleep habits. Review good sleep hygiene tips.   Initial Review & Psychosocial Screening:  Initial Psych Review & Screening - 07/01/24 0946       Initial Review   Current issues with Current Sleep Concerns      Family Dynamics   Good Support System? Yes      Barriers   Psychosocial barriers to participate in program There are no identifiable barriers or psychosocial needs.      Screening Interventions   Interventions Encouraged to exercise;Provide feedback about the scores to participant    Expected Outcomes Short Term goal: Utilizing psychosocial counselor, staff and physician to assist with identification of specific Stressors or current issues interfering with healing process. Setting desired goal for each stressor or current issue identified.;Long Term Goal: Stressors or current issues are controlled or eliminated.;Short Term goal: Identification and review with participant of any Quality of Life or Depression concerns found by scoring the questionnaire.;Long Term goal: The participant improves quality of Life and PHQ9 Scores as seen by post scores and/or verbalization of changes          Quality of Life Scores:   Quality of Life - 07/01/24 1338       Quality of Life   Select Quality of Life      Quality of Life Scores   Health/Function Pre 16.12 %    Socioeconomic Pre 21.17 %    Psych/Spiritual Pre 21.21 %    Family Pre 27.6 %    GLOBAL Pre 20.1 %         Scores of 19 and below usually indicate a poorer quality of life in these areas.  A difference of  2-3 points is a clinically meaningful difference.  A difference of 2-3 points in the total score of the Quality of Life Index has been associated with significant improvement in overall quality of life, self-image, physical symptoms, and general health in studies assessing  change in quality of life.  PHQ-9: Review Flowsheet  08/05/2024  Depression screen PHQ 2/9  Decreased Interest 0  Down, Depressed, Hopeless 0  PHQ - 2 Score 0  Altered sleeping 1  Tired, decreased energy 3  Change in appetite 0  Feeling bad or failure about yourself  0  Trouble concentrating 0  Moving slowly or fidgety/restless 0  Suicidal thoughts 0  PHQ-9 Score 4  Difficult doing work/chores Somewhat difficult   Interpretation of Total Score  Total Score Depression Severity:  1-4 = Minimal depression, 5-9 = Mild depression, 10-14 = Moderate depression, 15-19 = Moderately severe depression, 20-27 = Severe depression   Psychosocial Evaluation and Intervention:  Psychosocial Evaluation - 07/01/24 1200       Psychosocial Evaluation & Interventions   Interventions Encouraged to exercise with the program and follow exercise prescription;Stress management education;Relaxation education    Comments 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 He states he has been healing well and is ready to start getting closer to getting back to the golf course. His sleep has been a recent concern post CABG and then two weeks after his surgery he had to have a pacemaker implanted. His wife helps him manage his health care, she is a engineer, civil (consulting). He enjoys going to his beach place and golfing. He had a trip planned to [place], but had to be postponed until May because of his heart surgery. He notes that he would not have been able to walk and keep up had he gone prior to having surgery, so he is thankful they didn't go and is looking forward to the new trip dates because he feels like he will be ready to enjoy the trip more. He also plans on being in his son's wedding in April and that is a big motivation for him to get stronger. He reports no stress concerns and is ready to start the program    Expected Outcomes Short: attend cardiac rehab for education and exercise Long: develop and maintain positive self  care habits    Continue Psychosocial Services  Follow up required by staff          Psychosocial Re-Evaluation:  Psychosocial Re-Evaluation     Row Name 08/05/24 [phone]             Psychosocial Re-Evaluation   Current issues with Current Stress Concerns       Comments Chase Harris reports that he doesn't sleep well, but his sleep patterns have been this way for a while. He states he manages it ok. He has no concerns with mental health and reports that he manages stress well. He continues to have a strong support system.       Expected Outcomes Short: continue to attend cardiac rehab for mental health benefits of exercise. Long: maintain good mental health routine upon graduation from cardiac rehab.       Interventions Encouraged to attend Cardiac Rehabilitation for the exercise       Continue Psychosocial Services  Follow up required by staff          Psychosocial Discharge (Final Psychosocial Re-Evaluation):  Psychosocial Re-Evaluation - 08/05/24 0958       Psychosocial Re-Evaluation   Current issues with Current Stress Concerns    Comments Chase Harris reports that he doesn't sleep well, but his sleep patterns have been this way for a while. He states he manages it ok. He has no concerns with mental health and reports that he manages stress well. He continues to have a strong support system.  Expected Outcomes Short: continue to attend cardiac rehab for mental health benefits of exercise. Long: maintain good mental health routine upon graduation from cardiac rehab.    Interventions Encouraged to attend Cardiac Rehabilitation for the exercise    Continue Psychosocial Services  Follow up required by staff          Vocational Rehabilitation: Provide vocational rehab assistance to qualifying candidates.   Vocational Rehab Evaluation & Intervention:  Vocational Rehab - 07/01/24 0945       Initial Vocational Rehab Evaluation & Intervention   Assessment shows need for Vocational  Rehabilitation No          Education: Education Goals: Education classes will be provided on a variety of topics geared toward better understanding of heart health and risk factor modification. Participant will state understanding/return demonstration of topics presented as noted by education test scores.  Learning Barriers/Preferences:  Learning Barriers/Preferences - 07/01/24 0942       Learning Barriers/Preferences   Learning Barriers None    Learning Preferences Individual Instruction          General Cardiac Education Topics:  AED/CPR: - Group verbal and written instruction with the use of models to demonstrate the basic use of the AED with the basic ABC's of resuscitation.   Test and Procedures: - Group verbal and visual presentation and models provide information about basic cardiac anatomy and function. Reviews the testing methods done to diagnose heart disease and the outcomes of the test results. Describes the treatment choices: Medical Management, Angioplasty, or Coronary Bypass Surgery for treating various heart conditions including Myocardial Infarction, Angina, Valve Disease, and Cardiac Arrhythmias. Written material provided at class time.   Medication Safety: - Group verbal and visual instruction to review commonly prescribed medications for heart and lung disease. Reviews the medication, class of the drug, and side effects. Includes the steps to properly store meds and maintain the prescription regimen. Written material provided at class time.   Intimacy: - Group verbal instruction through game format to discuss how heart and lung disease can affect sexual intimacy. Written material provided at class time.   Know Your Numbers and Heart Failure: - Group verbal and visual instruction to discuss disease risk factors for cardiac and pulmonary disease and treatment options.  Reviews associated critical values for Overweight/Obesity, Hypertension, Cholesterol, and  Diabetes.  Discusses basics of heart failure: signs/symptoms and treatments.  Introduces Heart Failure Zone chart for action plan for heart failure. Written material provided at class time.   Infection Prevention: - Provides verbal and written material to individual with discussion of infection control including proper hand washing and proper equipment cleaning during exercise session. Flowsheet Row Cardiac Rehab from 08/14/2024 in [facility] Cardiac and Pulmonary Rehab  Date 07/01/24  Educator [facility]  Instruction Review Code 1- Verbalizes Understanding    Falls Prevention: - Provides verbal and written material to individual with discussion of falls prevention and safety. Flowsheet Row Cardiac Rehab from 08/14/2024 in [facility] Cardiac and Pulmonary Rehab  Date 07/01/24  Educator [facility]  Instruction Review Code 1- Verbalizes Understanding    Other: -Provides group and verbal instruction on various topics (see comments)   Knowledge Questionnaire Score:  Knowledge Questionnaire Score - 07/01/24 1338       Knowledge Questionnaire Score   Pre Score 25/26          Core Components/Risk Factors/Patient Goals at Admission:  Personal Goals and Risk Factors at Admission - 07/01/24 0940       Core Components/Risk Factors/Patient  Goals on Admission    Weight Management Yes;Weight Loss    Intervention Weight Management: Develop a combined nutrition and exercise program designed to reach desired caloric intake, while maintaining appropriate intake of nutrient and fiber, sodium and fats, and appropriate energy expenditure required for the weight goal.;Weight Management: Provide education and appropriate resources to help participant work on and attain dietary goals.;Weight Management/Obesity: Establish reasonable short term and long term weight goals.;Obesity: Provide education and appropriate resources to help participant work on and attain dietary goals.    Admit Weight 255 lb (115.7 kg)    Goal Weight:  Long Term 225 lb (102.1 kg)    Expected Outcomes Short Term: Continue to assess and modify interventions until short term weight is achieved;Long Term: Adherence to nutrition and physical activity/exercise program aimed toward attainment of established weight goal;Weight Loss: Understanding of general recommendations for a balanced deficit meal plan, which promotes 1-2 lb weight loss per week and includes a negative energy balance of [phone] kcal/d;Understanding recommendations for meals to include 15-35% energy as protein, 25-35% energy from fat, 35-60% energy from carbohydrates, less than 200mg  of dietary cholesterol, 20-35 gm of total fiber daily;Understanding of distribution of calorie intake throughout the day with the consumption of 4-5 meals/snacks    Diabetes Yes    Intervention Provide education about signs/symptoms and action to take for hypo/hyperglycemia.;Provide education about proper nutrition, including hydration, and aerobic/resistive exercise prescription along with prescribed medications to achieve blood glucose in normal ranges: Fasting glucose 65-99 mg/dL    Expected Outcomes Short Term: Participant verbalizes understanding of the signs/symptoms and immediate care of hyper/hypoglycemia, proper foot care and importance of medication, aerobic/resistive exercise and nutrition plan for blood glucose control.;Long Term: Attainment of HbA1C < 7%.    Hypertension Yes    Intervention Monitor prescription use compliance.;Provide education on lifestyle modifcations including regular physical activity/exercise, weight management, moderate sodium restriction and increased consumption of fresh fruit, vegetables, and low fat dairy, alcohol moderation, and smoking cessation.    Expected Outcomes Short Term: Continued assessment and intervention until BP is < 140/17mm HG in hypertensive participants. < 130/86mm HG in hypertensive participants with diabetes, heart failure or chronic kidney disease.;Long  Term: Maintenance of blood pressure at goal levels.    Lipids Yes    Intervention Provide education and support for participant on nutrition & aerobic/resistive exercise along with prescribed medications to achieve LDL 70mg , HDL >40mg .    Expected Outcomes Short Term: Participant states understanding of desired cholesterol values and is compliant with medications prescribed. Participant is following exercise prescription and nutrition guidelines.;Long Term: Cholesterol controlled with medications as prescribed, with individualized exercise RX and with personalized nutrition plan. Value goals: LDL < 70mg , HDL > 40 mg.          Education:Diabetes - Individual verbal and written instruction to review signs/symptoms of diabetes, desired ranges of glucose level fasting, after meals and with exercise. Acknowledge that pre and post exercise glucose checks will be done for 3 sessions at entry of program. Flowsheet Row Cardiac Rehab from 08/14/2024 in [facility] Cardiac and Pulmonary Rehab  Date 07/01/24  Educator [facility]  Instruction Review Code 1- Verbalizes Understanding    Core Components/Risk Factors/Patient Goals Review:   Goals and Risk Factor Review     Row Name 08/05/24 [phone]             Core Components/Risk Factors/Patient Goals Review   Personal Goals Review Lipids;Diabetes;Hypertension;Weight Management/Obesity       Review Chase Harris states that his weight  has gone up a little, but he was on vacation and ate out alot. He has been trying to eat at home more to manage that. He does report that he has a BP cuff at home but does not check it regulalrly. He was encouraged to start checking it several times a week to get into the habit of keeping an eye on it. He also monitors blood sugar at home and reports it is mostly in acceptable ranges. He reports that he takes all medicaiton from DM, BP, and cholesterol. He follows up regularly with his doctor for lab work and check ups to manage his risk factors.        Expected Outcomes Short: start checking BP at home. Long: control cardiac risk factors and keep working towards weight loss goal of 245 lbs.          Core Components/Risk Factors/Patient Goals at Discharge (Final Review):   Goals and Risk Factor Review - 08/05/24 0949       Core Components/Risk Factors/Patient Goals Review   Personal Goals Review Lipids;Diabetes;Hypertension;Weight Management/Obesity    Review Chase Harris states that his weight has gone up a little, but he was on vacation and ate out alot. He has been trying to eat at home more to manage that. He does report that he has a BP cuff at home but does not check it regulalrly. He was encouraged to start checking it several times a week to get into the habit of keeping an eye on it. He also monitors blood sugar at home and reports it is mostly in acceptable ranges. He reports that he takes all medicaiton from DM, BP, and cholesterol. He follows up regularly with his doctor for lab work and check ups to manage his risk factors.    Expected Outcomes Short: start checking BP at home. Long: control cardiac risk factors and keep working towards weight loss goal of 245 lbs.          ITP Comments:  ITP Comments     Row Name 07/01/24 1208 07/17/24 0752 07/17/24 1021 08/14/24 0943     ITP Comments Initital orientation and 6 MWT completed.  Initial ITP created and sent for review to Dr. Oneil Pinal, Medical Director. First full day of exercise!  Patient was oriented to gym and equipment including functions, settings, policies, and procedures.  Patient's individual exercise prescription and treatment plan were reviewed.  All starting workloads were established based on the results of the 6 minute walk test done at initial orientation visit.  The plan for exercise progression was also introduced and progression will be customized based on patient's performance and goals. 30 Day review completed. Medical Director ITP review done; changes made as  directed and signed approval by Medical Director. New to program. 30 Day review completed. Medical Director ITP review done; changes made as directed and signed approval by Medical Director.       Comments: 30 day review

## 2024-08-14 NOTE — Progress Notes (Signed)
 Daily Session Note  Patient Details  Name: Chase Harris MRN: [id number] Date of Birth: Jul 21, 1961 Referring Provider:   Flowsheet Row Cardiac Rehab from 07/01/2024 in [facility] Cardiac and Pulmonary Rehab  Referring Provider Dr. Evalene Lunger, MD    Encounter Date: 08/14/2024  Check In:  Session Check In - 08/14/24 0931       Check-In   Supervising physician immediately available to respond to emergencies See telemetry face sheet for immediately available ER MD    Location [facility]-Cardiac & Pulmonary Rehab    Staff Present Rollene Paterson, MS, Exercise Physiologist;Noah Tickle, BS, Exercise Physiologist;Leodis Alcocer Peggi, RN, DNP, NE-BC;Kelly Bollinger RN,BSN,MPA    Virtual Visit No    Medication changes reported     No    Fall or balance concerns reported    No    Warm-up and Cool-down Performed on first and last piece of equipment    Resistance Training Performed Yes    VAD Patient? No    PAD/SET Patient? No      Pain Assessment   Currently in Pain? No/denies             Social History   Tobacco Use  Smoking Status Former  Smokeless Tobacco Never    Goals Met:  Independence with exercise equipment Exercise tolerated well No report of concerns or symptoms today Strength training completed today  Goals Unmet:  Not Applicable  Comments: Pt able to follow exercise prescription today without complaint.  Will continue to monitor for progression.    Dr. Oneil Pinal is Medical Director for [facility] Cardiac Rehabilitation.  Dr. Fuad Aleskerov is Medical Director for [facility] Pulmonary Rehabilitation.

## 2024-08-16 ENCOUNTER — Encounter

## 2024-08-19 ENCOUNTER — Encounter

## 2024-08-19 DIAGNOSIS — Z951 Presence of aortocoronary bypass graft: Secondary | ICD-10-CM | POA: Diagnosis not present

## 2024-08-19 NOTE — Progress Notes (Signed)
 Daily Session Note  Patient Details  Name: Chase Harris MRN: [id number] Date of Birth: Aug 31, 1961 Referring Provider:   Flowsheet Row Cardiac Rehab from 07/01/2024 in [facility] Cardiac and Pulmonary Rehab  Referring Provider Dr. Evalene Lunger, MD    Encounter Date: 08/19/2024  Check In:  Session Check In - 08/19/24 0918       Check-In   Supervising physician immediately available to respond to emergencies See telemetry face sheet for immediately available ER MD    Location [facility]-Cardiac & Pulmonary Rehab    Staff Present Burnard Davenport RN,BSN,MPA;Joseph [facility] Dyane BS, ACSM CEP, Exercise Physiologist;Maxon Conetta BS, Exercise Physiologist    Virtual Visit No    Medication changes reported     No    Fall or balance concerns reported    No    Warm-up and Cool-down Performed on first and last piece of equipment    Resistance Training Performed Yes    VAD Patient? No    PAD/SET Patient? No      Pain Assessment   Currently in Pain? No/denies             Social History   Tobacco Use  Smoking Status Former  Smokeless Tobacco Never    Goals Met:  Independence with exercise equipment Exercise tolerated well No report of concerns or symptoms today Strength training completed today  Goals Unmet:  Not Applicable  Comments: Pt able to follow exercise prescription today without complaint.  Will continue to monitor for progression.    Dr. Oneil Pinal is Medical Director for [facility] Cardiac Rehabilitation.  Dr. Fuad Aleskerov is Medical Director for [facility] Pulmonary Rehabilitation.

## 2024-08-21 ENCOUNTER — Encounter

## 2024-08-21 DIAGNOSIS — Z951 Presence of aortocoronary bypass graft: Secondary | ICD-10-CM

## 2024-08-21 NOTE — Progress Notes (Signed)
 Daily Session Note  Patient Details  Name: Chase Harris MRN: [id number] Date of Birth: [birth year] Referring Provider:   Flowsheet Row Cardiac Rehab from 07/01/2024 in [facility] Cardiac and Pulmonary Rehab  Referring Provider Dr. Evalene Lunger, MD    Encounter Date: 08/21/2024  Check In:  Session Check In - 08/21/24 0913       Check-In   Supervising physician immediately available to respond to emergencies See telemetry face sheet for immediately available ER MD    Location [facility]-Cardiac & Pulmonary Rehab    Staff Present Burnard Davenport RN,BSN,MPA;Maxon Conetta BS, Exercise Physiologist;Laura Cates RN,BSN;Margaret Best, MS, Exercise Physiologist    Virtual Visit No    Medication changes reported     No    Fall or balance concerns reported    No    Warm-up and Cool-down Performed on first and last piece of equipment    Resistance Training Performed Yes    VAD Patient? No    PAD/SET Patient? No      Pain Assessment   Currently in Pain? No/denies             Social History   Tobacco Use  Smoking Status Former  Smokeless Tobacco Never    Goals Met:  Independence with exercise equipment Exercise tolerated well No report of concerns or symptoms today Strength training completed today  Goals Unmet:  Not Applicable  Comments: Pt able to follow exercise prescription today without complaint.  Will continue to monitor for progression.    Dr. Oneil Pinal is Medical Director for [facility] Cardiac Rehabilitation.  Dr. Fuad Aleskerov is Medical Director for [facility] Pulmonary Rehabilitation.

## 2024-08-26 ENCOUNTER — Encounter

## 2024-08-28 ENCOUNTER — Encounter

## 2024-08-30 ENCOUNTER — Encounter

## 2024-08-30 DIAGNOSIS — Z951 Presence of aortocoronary bypass graft: Secondary | ICD-10-CM | POA: Diagnosis present

## 2024-08-30 NOTE — Progress Notes (Signed)
 Daily Session Note  Patient Details  Name: Chase Harris MRN: [id number] Date of Birth: April 19, 1961 Referring Provider:   Flowsheet Row Cardiac Rehab from 07/01/2024 in [facility] Cardiac and Pulmonary Rehab  Referring Provider Dr. Evalene Lunger, MD    Encounter Date: 08/30/2024  Check In:  Session Check In - 08/30/24 0904       Check-In   Supervising physician immediately available to respond to emergencies See telemetry face sheet for immediately available ER MD    Location [facility]-Cardiac & Pulmonary Rehab    Staff Present Burnard Davenport RN,BSN,MPA;Joseph Hood RCP,RRT,BSRT;Maxon Conetta BS, Exercise Physiologist;Laura Cates RN,BSN    Virtual Visit No    Medication changes reported     No    Fall or balance concerns reported    No    Warm-up and Cool-down Performed on first and last piece of equipment    Resistance Training Performed Yes    VAD Patient? No    PAD/SET Patient? No      Pain Assessment   Currently in Pain? No/denies             Social History   Tobacco Use  Smoking Status Former  Smokeless Tobacco Never    Goals Met:  Independence with exercise equipment Exercise tolerated well No report of concerns or symptoms today Strength training completed today  Goals Unmet:  Not Applicable  Comments: Pt able to follow exercise prescription today without complaint.  Will continue to monitor for progression.    Dr. Oneil Pinal is Medical Director for [facility] Cardiac Rehabilitation.  Dr. Fuad Aleskerov is Medical Director for [facility] Pulmonary Rehabilitation.

## 2024-09-02 ENCOUNTER — Encounter

## 2024-09-04 ENCOUNTER — Encounter

## 2024-09-04 DIAGNOSIS — Z951 Presence of aortocoronary bypass graft: Secondary | ICD-10-CM

## 2024-09-04 NOTE — Progress Notes (Signed)
 Daily Session Note  Patient Details  Name: Chase Harris MRN: [id number] Date of Birth: 1961/07/09 Referring Provider:   Flowsheet Row Cardiac Rehab from 07/01/2024 in [facility] Cardiac and Pulmonary Rehab  Referring Provider Dr. Evalene Lunger, MD    Encounter Date: 09/04/2024  Check In:  Session Check In - 09/04/24 0915       Check-In   Supervising physician immediately available to respond to emergencies See telemetry face sheet for immediately available ER MD    Location [facility]-Cardiac & Pulmonary Rehab    Staff Present Burnard Davenport RN,BSN,MPA;Laura Cates RN,BSN;Joseph [facility] BS, Exercise Physiologist    Virtual Visit No    Medication changes reported     No    Fall or balance concerns reported    No    Warm-up and Cool-down Performed on first and last piece of equipment    Resistance Training Performed Yes    VAD Patient? No    PAD/SET Patient? No      Pain Assessment   Currently in Pain? No/denies             Social History   Tobacco Use  Smoking Status Former  Smokeless Tobacco Never    Goals Met:  Independence with exercise equipment Exercise tolerated well No report of concerns or symptoms today Strength training completed today  Goals Unmet:  Not Applicable  Comments: Pt able to follow exercise prescription today without complaint.  Will continue to monitor for progression.    Dr. Oneil Pinal is Medical Director for [facility] Cardiac Rehabilitation.  Dr. Fuad Aleskerov is Medical Director for [facility] Pulmonary Rehabilitation.

## 2024-09-06 ENCOUNTER — Encounter: Admitting: Emergency Medicine

## 2024-09-06 ENCOUNTER — Encounter

## 2024-09-06 DIAGNOSIS — Z951 Presence of aortocoronary bypass graft: Secondary | ICD-10-CM | POA: Diagnosis not present

## 2024-09-06 NOTE — Progress Notes (Signed)
 Daily Session Note  Patient Details  Name: Chase Harris MRN: [id number] Date of Birth: [birth year] Referring Provider:   Flowsheet Row Cardiac Rehab from 07/01/2024 in [facility]  Referring Provider Dr. Evalene Lunger, MD    Encounter Date: 09/06/2024  Check In:  Session Check In - 09/06/24 0937       Check-In   Supervising physician immediately available to respond to emergencies See telemetry face sheet for immediately available ER MD    Location [facility]-Cardiac & Pulmonary Rehab    Staff Present Leita Franks RN,BSN;Joseph [facility] [address] BS, Exercise Physiologist;Noah Tickle, BS, Exercise Physiologist    Virtual Visit No    Medication changes reported     No    Fall or balance concerns reported    No    Warm-up and Cool-down Performed on first and last piece of equipment    Resistance Training Performed Yes    VAD Patient? No    PAD/SET Patient? No      Pain Assessment   Currently in Pain? No/denies             Tobacco Use History[1]  Goals Met:  Independence with exercise equipment Exercise tolerated well No report of concerns or symptoms today Strength training completed today  Goals Unmet:  Not Applicable  Comments: Pt able to follow exercise prescription today without complaint.  Will continue to monitor for progression.    Dr. Oneil Pinal is Medical Director for [facility].  Dr. Fuad Aleskerov is Medical Director for [facility].    [1]  Social History Tobacco Use  Smoking Status Former  Smokeless Tobacco Never

## 2024-09-09 ENCOUNTER — Encounter

## 2024-09-11 ENCOUNTER — Encounter

## 2024-09-11 DIAGNOSIS — Z951 Presence of aortocoronary bypass graft: Secondary | ICD-10-CM

## 2024-09-11 NOTE — Progress Notes (Signed)
 Cardiac Individual Treatment Plan  Patient Details  Name: Chase Harris MRN: [id number] Date of Birth: Sep 22, 1961 Referring Provider:   Flowsheet Row Cardiac Rehab from 07/01/2024 in [facility] Cardiac and Pulmonary Rehab  Referring Provider Dr. Evalene Lunger, MD    Initial Encounter Date:  Flowsheet Row Cardiac Rehab from 07/01/2024 in [facility] Cardiac and Pulmonary Rehab  Date 07/01/24    Visit Diagnosis: S/P CABG x 4  Patient's Home Medications on Admission: Current Medications[1]  Past Medical History: Past Medical History:  Diagnosis Date   Arthritis    Chronic kidney disease    Constipation    Diabetes mellitus without complication (HCC)    Metformin   Diabetic peripheral neuropathy (HCC)    GERD (gastroesophageal reflux disease)    History of prostate cancer    Hypertension    Peripheral vascular disease    Prostate cancer (HCC)    Sleep apnea    has cpap   Stroke [facility])    mini stroke 2016 - no deficitis    Tobacco Use: Tobacco Use History[2]  Labs: Review Flowsheet       Latest Ref Rng & Units 03/06/2013 04/07/2019 01/31/2023 06/17/2024  Labs for ITP Cardiac and Pulmonary Rehab  Cholestrol 0 - 200 mg/dL 824  - - 97   LDL (calc) 0 - 99 mg/dL 93  - - 29   HDL-C >59 mg/dL 34  - - 43   Trlycerides <150 mg/dL 761  - - 876   Hemoglobin A1c 4.8 - 5.6 % 10.3  8.2  7.3  7.4      Exercise Target Goals: Exercise Program Goal: Individual exercise prescription set using results from initial 6 min walk test and THRR while considering  patients activity barriers and safety.   Exercise Prescription Goal: Initial exercise prescription builds to 30-45 minutes a day of aerobic activity, 2-3 days per week.  Home exercise guidelines will be given to patient during program as part of exercise prescription that the participant will acknowledge.   Education: Aerobic Exercise: - Group verbal and visual presentation on the components of exercise prescription. Introduces F.I.T.T  principle from ACSM for exercise prescriptions.  Reviews F.I.T.T. principles of aerobic exercise including progression. Written material provided at class time.   Education: Resistance Exercise: - Group verbal and visual presentation on the components of exercise prescription. Introduces F.I.T.T principle from ACSM for exercise prescriptions  Reviews F.I.T.T. principles of resistance exercise including progression. Written material provided at class time.    Education: Exercise & Equipment Safety: - Individual verbal instruction and demonstration of equipment use and safety with use of the equipment. Flowsheet Row Cardiac Rehab from 09/04/2024 in [facility] Cardiac and Pulmonary Rehab  Date 07/01/24  Educator [facility]  Instruction Review Code 1- Verbalizes Understanding    Education: Exercise Physiology & General Exercise Guidelines: - Group verbal and written instruction with models to review the exercise physiology of the cardiovascular system and associated critical values. Provides general exercise guidelines with specific guidelines to those with heart or lung disease. Written material provided at class time. Flowsheet Row Cardiac Rehab from 09/04/2024 in [facility] Cardiac and Pulmonary Rehab  Date 09/04/24  Educator nt  Instruction Review Code 1- Tefl Teacher Understanding    Education: Flexibility, Balance, Mind/Body Relaxation: - Group verbal and visual presentation with interactive activity on the components of exercise prescription. Introduces F.I.T.T principle from ACSM for exercise prescriptions. Reviews F.I.T.T. principles of flexibility and balance exercise training including progression. Also discusses the mind body connection.  Reviews various  relaxation techniques to help reduce and manage stress (i.e. Deep breathing, progressive muscle relaxation, and visualization). Balance handout provided to take home. Written material provided at class time.   Activity Barriers & Risk Stratification:   Activity Barriers & Cardiac Risk Stratification - 07/01/24 1330       Activity Barriers & Cardiac Risk Stratification   Activity Barriers Left Knee Replacement;Joint Problems;Deconditioning;Arthritis;Balance Concerns;Other (comment)    Comments Plantar Fasciitis    Cardiac Risk Stratification High          6 Minute Walk:  6 Minute Walk     Row Name 07/01/24 1329         6 Minute Walk   Phase Initial     Distance 1005 feet     Walk Time 6 minutes     # of Rest Breaks 0     MPH 1.9     METS 2.28     RPE 13     Perceived Dyspnea  0     VO2 Peak 7.99     Symptoms Yes (comment)     Comments lightheadedness     Resting HR 74 bpm     Resting BP 126/68     Resting Oxygen Saturation  96 %     Exercise Oxygen Saturation  during 6 min walk 96 %     Max Ex. HR 101 bpm     Max Ex. BP 152/68     2 Minute Post BP 134/66        Oxygen Initial Assessment:   Oxygen Re-Evaluation:   Oxygen Discharge (Final Oxygen Re-Evaluation):   Initial Exercise Prescription:  Initial Exercise Prescription - 07/01/24 1300       Date of Initial Exercise RX and Referring Provider   Date 07/01/24    Referring Provider Dr. Timothy Gollan, MD      Oxygen   Maintain Oxygen Saturation 88% or higher      Treadmill   MPH 2    Grade 0    Minutes 15    METs 2.53      NuStep   Level 2   T6 nustep   SPM 80    Minutes 15    METs 2.28      REL-XR   Level 2    Speed 50    Minutes 15    METs 2.28      Prescription Details   Frequency (times per week) 3    Duration Progress to 30 minutes of continuous aerobic without signs/symptoms of physical distress      Intensity   THRR 40-80% of Max Heartrate 106-139    Ratings of Perceived Exertion 11-13    Perceived Dyspnea 0-4      Progression   Progression Continue to progress workloads to maintain intensity without signs/symptoms of physical distress.      Resistance Training   Training Prescription Yes    Weight 5 lb    Reps 10-15           Perform Capillary Blood Glucose checks as needed.  Exercise Prescription Changes:   Exercise Prescription Changes     Row Name 07/01/24 1300 07/30/24 1500 08/05/24 0900 08/14/24 1500 08/29/24 1000     Response to Exercise   Blood Pressure (Admit) 126/68 120/72 -- 130/62 110/72   Blood Pressure (Exercise) 152/68 144/72 -- 138/76 114/78   Blood Pressure (Exit) 134/66 102/60 -- 116/70 114/60   Heart Rate (Admit) 74 bpm 94 bpm -- 97 bpm  108 bpm   Heart Rate (Exercise) 101 bpm 123 bpm -- 120 bpm 114 bpm   Heart Rate (Exit) 83 bpm 95 bpm -- 94 bpm 97 bpm   Oxygen Saturation (Admit) 96 % -- -- -- --   Oxygen Saturation (Exercise) 96 % -- -- -- --   Rating of Perceived Exertion (Exercise) 13 14 -- 12 12   Perceived Dyspnea (Exercise) 0 -- -- -- --   Symptoms lightheadedness none -- none none   Comments Results 1st 2 weeks of exercise sessions -- -- --   Duration -- Progress to 30 minutes of  aerobic without signs/symptoms of physical distress Progress to 30 minutes of  aerobic without signs/symptoms of physical distress Continue with 30 min of aerobic exercise without signs/symptoms of physical distress. Continue with 30 min of aerobic exercise without signs/symptoms of physical distress.   Intensity -- THRR unchanged THRR unchanged THRR unchanged THRR unchanged     Progression   Progression -- Continue to progress workloads to maintain intensity without signs/symptoms of physical distress. Continue to progress workloads to maintain intensity without signs/symptoms of physical distress. Continue to progress workloads to maintain intensity without signs/symptoms of physical distress. Continue to progress workloads to maintain intensity without signs/symptoms of physical distress.   Average METs -- 2.31 2.31 2.53 2.6     Resistance Training   Training Prescription -- Yes Yes Yes Yes   Weight -- 5 lb 5 lb 5 lb 5 lb   Reps -- 10-15 10-15 10-15 10-15     Interval Training    Interval Training -- No No No No     Treadmill   MPH -- 2 2 2 2    Grade -- 0 0 0 0   Minutes -- 15 15 15 15    METs -- 2.53 2.53 2.53 2.53     Recumbant Bike   Level -- 3 3 -- --   RPM -- 50 50 -- --   Watts -- 19 19 -- --   Minutes -- 15 15 -- --   METs -- 2.51 2.51 -- --     NuStep   Level -- -- -- 2  T6 nustep --   Minutes -- -- -- 15 --   METs -- -- -- 2.3 --     REL-XR   Level -- 2 2 -- 1   Minutes -- 15 15 -- 15   METs -- 2.3 2.3 -- 4.2     T5 Nustep   Level -- 2 2 3 2    SPM -- 80 80 -- --   Minutes -- 15 15 15 15    METs -- 2 2 3  1.9     Biostep-RELP   Level -- -- -- -- 2   Minutes -- -- -- -- 15   METs -- -- -- -- 2     Home Exercise Plan   Plans to continue exercise at -- -- [employer] (comment)  [employer] (comment)  [employer] (comment)  [employer]   Frequency -- -- Add 2 additional days to program exercise sessions. Add 2 additional days to program exercise sessions. Add 2 additional days to program exercise sessions.   Initial Home Exercises Provided -- -- 08/05/24 08/05/24 08/05/24     Oxygen   Maintain Oxygen Saturation -- 88% or higher 88% or higher 88% or higher 88% or higher      Exercise Comments:   Exercise Comments  Row Name 07/17/24 [phone]           Exercise Comments First full day of exercise!  Patient was oriented to gym and equipment including functions, settings, policies, and procedures.  Patient's individual exercise prescription and treatment plan were reviewed.  All starting workloads were established based on the results of the 6 minute walk test done at initial orientation visit.  The plan for exercise progression was also introduced and progression will be customized based on patient's performance and goals.          Exercise Goals and Review:   Exercise Goals     Row Name 07/01/24 1331             Exercise Goals   Increase Physical Activity Yes        Intervention Develop an individualized exercise prescription for aerobic and resistive training based on initial evaluation findings, risk stratification, comorbidities and participant's personal goals.;Provide advice, education, support and counseling about physical activity/exercise needs.       Expected Outcomes Short Term: Attend rehab on a regular basis to increase amount of physical activity.;Long Term: Add in home exercise to make exercise part of routine and to increase amount of physical activity.;Long Term: Exercising regularly at least 3-5 days a week.       Increase Strength and Stamina Yes       Intervention Provide advice, education, support and counseling about physical activity/exercise needs.;Develop an individualized exercise prescription for aerobic and resistive training based on initial evaluation findings, risk stratification, comorbidities and participant's personal goals.       Expected Outcomes Short Term: Increase workloads from initial exercise prescription for resistance, speed, and METs.;Short Term: Perform resistance training exercises routinely during rehab and add in resistance training at home;Long Term: Improve cardiorespiratory fitness, muscular endurance and strength as measured by increased METs and functional capacity ( )       Able to understand and use rate of perceived exertion (RPE) scale Yes       Intervention Provide education and explanation on how to use RPE scale       Expected Outcomes Short Term: Able to use RPE daily in rehab to express subjective intensity level;Long Term:  Able to use RPE to guide intensity level when exercising independently       Able to understand and use Dyspnea scale Yes       Intervention Provide education and explanation on how to use Dyspnea scale       Expected Outcomes Short Term: Able to use Dyspnea scale daily in rehab to express subjective sense of shortness of breath during exertion;Long Term: Able to use Dyspnea scale to  guide intensity level when exercising independently       Knowledge and understanding of Target Heart Rate Range (THRR) Yes       Intervention Provide education and explanation of THRR including how the numbers were predicted and where they are located for reference       Expected Outcomes Short Term: Able to state/look up THRR;Long Term: Able to use THRR to govern intensity when exercising independently;Short Term: Able to use daily as guideline for intensity in rehab       Able to check pulse independently Yes       Intervention Provide education and demonstration on how to check pulse in carotid and radial arteries.;Review the importance of being able to check your own pulse for safety during independent exercise       Expected Outcomes Short  Term: Able to explain why pulse checking is important during independent exercise;Long Term: Able to check pulse independently and accurately       Understanding of Exercise Prescription Yes       Intervention Provide education, explanation, and written materials on patient's individual exercise prescription       Expected Outcomes Short Term: Able to explain program exercise prescription;Long Term: Able to explain home exercise prescription to exercise independently          Exercise Goals Re-Evaluation :  Exercise Goals Re-Evaluation     Row Name 07/17/24 0752 07/30/24 1528 08/05/24 0941 08/14/24 1548 08/29/24 1048     Exercise Goal Re-Evaluation   Exercise Goals Review Increase Physical Activity;Able to understand and use rate of perceived exertion (RPE) scale;Knowledge and understanding of Target Heart Rate Range (THRR);Understanding of Exercise Prescription;Increase Strength and Stamina;Able to understand and use Dyspnea scale;Able to check pulse independently Increase Physical Activity;Understanding of Exercise Prescription;Increase Strength and Stamina Increase Physical Activity;Increase Strength and Stamina;Able to understand and use rate of  perceived exertion (RPE) scale;Able to understand and use Dyspnea scale;Knowledge and understanding of Target Heart Rate Range (THRR);Able to check pulse independently;Understanding of Exercise Prescription Increase Physical Activity;Increase Strength and Stamina;Understanding of Exercise Prescription Increase Physical Activity;Increase Strength and Stamina;Understanding of Exercise Prescription   Comments Reviewed RPE and dyspnea scale, THR and program prescription with pt today.  Pt voiced understanding and was given a copy of goals to take home. Chase Harris is off to a good start in the program and completed his 1st 2 weeks of exercise sessions. He was able to do a speed of 2 mph on the treadmill with no incline. He also worked at level 3 on the recumbent bike, level 2 on the T5 nustep, and level 2 on the XR. We will continue to monitor his progress in the program. Reviewed home exercise with pt today.  Pt plans to go to [employer] for exercise.  Reviewed THR, pulse, RPE, sign and symptoms, pulse oximetery and when to call 911 or MD.  Also discussed weather considerations and indoor options.  Pt voiced understanding. Chase Harris is doing well in rehab. He continues to walk on the treadmill at a speed of 2 mph with no incline. He also improved to level 3 on the T5 nustep and continues to us  5 lb hand weights for resistance training. We will continue to monitor his progress in the program. Chase Harris is doing well in rehab. He has been able to maintain his workload on the treadmill at a speed of and no incline. He was also able to [facility] level 2 on both the T5 nustep and biostep. We will continue to monitor his progress in the program.   Expected Outcomes Short: Use RPE daily to regulate intensity. Long: Follow program prescription in THR. Short: Continue to follow current exercise prescription. Long: Continue exercise to improve strength and stamina. Short: add 1-2 days a week of exercise on off days of rehab. Long:  maintain indpendent exercise routine upon graduation from cardiac rehab. Short: Add incline to treadmill workload. Long: Continue exercise to improve strength and stamina. Short: Continue to work to improve treadmill workload. Long: Continue exercise to improve strength and stamina.      Discharge Exercise Prescription (Final Exercise Prescription Changes):  Exercise Prescription Changes - 08/29/24 1000       Response to Exercise   Blood Pressure (Admit) 110/72    Blood Pressure (Exercise) 114/78    Blood Pressure (Exit) 114/60  Heart Rate (Admit) 108 bpm    Heart Rate (Exercise) 114 bpm    Heart Rate (Exit) 97 bpm    Rating of Perceived Exertion (Exercise) 12    Symptoms none    Duration Continue with 30 min of aerobic exercise without signs/symptoms of physical distress.    Intensity THRR unchanged      Progression   Progression Continue to progress workloads to maintain intensity without signs/symptoms of physical distress.    Average METs 2.6      Resistance Training   Training Prescription Yes    Weight 5 lb    Reps 10-15      Interval Training   Interval Training No      Treadmill   MPH 2    Grade 0    Minutes 15    METs 2.53      REL-XR   Level 1    Minutes 15    METs 4.2      T5 Nustep   Level 2    Minutes 15    METs 1.9      Biostep-RELP   Level 2    Minutes 15    METs 2      Home Exercise Plan   Plans to continue exercise at [employer] (comment)   [employer]   Frequency Add 2 additional days to program exercise sessions.    Initial Home Exercises Provided 08/05/24      Oxygen   Maintain Oxygen Saturation 88% or higher          Nutrition:  Target Goals: Understanding of nutrition guidelines, daily intake of sodium 1500mg , cholesterol 200mg , calories 30% from fat and 7% or less from saturated fats, daily to have 5 or more servings of fruits and vegetables.  Education: Nutrition 1 -Group instruction provided by verbal,  written material, interactive activities, discussions, models, and posters to present general guidelines for heart healthy nutrition including macronutrients, label reading, and promoting whole foods over processed counterparts. Education serves as pensions consultant of discussion of heart healthy eating for all. Written material provided at class time. Flowsheet Row Cardiac Rehab from 09/04/2024 in [facility] Cardiac and Pulmonary Rehab  Date 07/17/24  Educator jg  Instruction Review Code 1- Verbalizes Understanding     Education: Nutrition 2 -Group instruction provided by verbal, written material, interactive activities, discussions, models, and posters to present general guidelines for heart healthy nutrition including sodium, cholesterol, and saturated fat. Providing guidance of habit forming to improve blood pressure, cholesterol, and body weight. Written material provided at class time.     Biometrics:  Pre Biometrics - 07/01/24 1331       Pre Biometrics   Height 5' 9 (1.753 m)    Weight 255 lb 4.8 oz (115.8 kg)    Waist Circumference 48 inches    Hip Circumference 44 inches    Waist to Hip Ratio 1.09 %    BMI (Calculated) 37.68    Single Leg Stand 4.5 seconds           Nutrition Therapy Plan and Nutrition Goals:  Nutrition Therapy & Goals - 07/01/24 1341       Nutrition Therapy   Protein (specify units) 80    Fiber 30 grams    Whole Grain Foods 3 servings    Saturated Fats 15 max. grams    Fruits and Vegetables 5 servings/day    Sodium 2 grams      Personal Nutrition Goals   Nutrition Goal Read labels  and reduce sodium intake to below 2300mg . Ideally 1500mg  per day.    Personal Goal #2 Reduce saturated fat, less than 12g per day. Replace bad fats for more heart healthy fats.    Personal Goal #3 Include more colorful produce, aim for 5-8 servings of fruits and veggies per day    Comments Patient drinking mostly unsweet tea, getting about 2 bottles of water in per day. He  usually eat 3 meals per day. His wife is a [employer] and tries to help him eat heart healthy and keep his DM under control. Spoke with about eating carb smart versus eating for heart health. Reviewed mediterranean diet handout, provided guidelines of less than 1500mg  sodium and less than 12g saturated fat per day, Educated on types of fats,sources, and how to read facts labels. Brainstormed a few meals with a focus on reducing sodium and saturated fat and including more colorful produce. A chicken pasta with side salad as an example with half as much sodium and saturated fat as the country ham sandwich while still the same amount of calories and carbs.      Intervention Plan   Intervention Prescribe, educate and counsel regarding individualized specific dietary modifications aiming towards targeted core components such as weight, hypertension, lipid management, diabetes, heart failure and other comorbidities.;Nutrition handout(s) given to patient.    Expected Outcomes Short Term Goal: Understand basic principles of dietary content, such as calories, fat, sodium, cholesterol and nutrients.;Short Term Goal: A plan has been developed with personal nutrition goals set during dietitian appointment.;Long Term Goal: Adherence to prescribed nutrition plan.          Nutrition Assessments:  MEDIFICTS Score Key: >=70 Need to make dietary changes  40-70 Heart Healthy Diet <= 40 Therapeutic Level Cholesterol Diet  Flowsheet Row Cardiac Rehab from 07/01/2024 in [facility] Cardiac and Pulmonary Rehab  Picture Your Plate Total Score on Admission 68   Picture Your Plate Scores: <59 Unhealthy dietary pattern with much room for improvement. 41-50 Dietary pattern unlikely to meet recommendations for good health and room for improvement. 51-60 More healthful dietary pattern, with some room for improvement.  >60 Healthy dietary pattern, although there may be some specific behaviors that could be improved.    Nutrition Goals  Re-Evaluation:  Nutrition Goals Re-Evaluation     Row Name 08/05/24 0943             Goals   Comment Chase Harris is getting more comfortable reading food labels and reports that he is watching sodium and saturated fats. He is doing well with eating more vegetables but not so much fruit, but he states that is because he is also watching his blood sugars.       Expected Outcome Short:continue to get more comfortable reading food labels. Long: maintain heart healthy diet.          Nutrition Goals Discharge (Final Nutrition Goals Re-Evaluation):  Nutrition Goals Re-Evaluation - 08/05/24 0943       Goals   Comment Chase Harris is getting more comfortable reading food labels and reports that he is watching sodium and saturated fats. He is doing well with eating more vegetables but not so much fruit, but he states that is because he is also watching his blood sugars.    Expected Outcome Short:continue to get more comfortable reading food labels. Long: maintain heart healthy diet.          Psychosocial: Target Goals: Acknowledge presence or absence of significant depression and/or stress, maximize coping skills,  provide positive support system. Participant is able to verbalize types and ability to use techniques and skills needed for reducing stress and depression.   Education: Stress, Anxiety, and Depression - Group verbal and visual presentation to define topics covered.  Reviews how body is impacted by stress, anxiety, and depression.  Also discusses healthy ways to reduce stress and to treat/manage anxiety and depression. Written material provided at class time.   Education: Sleep Hygiene -Provides group verbal and written instruction about how sleep can affect your health.  Define sleep hygiene, discuss sleep cycles and impact of sleep habits. Review good sleep hygiene tips.   Initial Review & Psychosocial Screening:  Initial Psych Review & Screening - 07/01/24 0946       Initial Review    Current issues with Current Sleep Concerns      Family Dynamics   Good Support System? Yes      Barriers   Psychosocial barriers to participate in program There are no identifiable barriers or psychosocial needs.      Screening Interventions   Interventions Encouraged to exercise;Provide feedback about the scores to participant    Expected Outcomes Short Term goal: Utilizing psychosocial counselor, staff and physician to assist with identification of specific Stressors or current issues interfering with healing process. Setting desired goal for each stressor or current issue identified.;Long Term Goal: Stressors or current issues are controlled or eliminated.;Short Term goal: Identification and review with participant of any Quality of Life or Depression concerns found by scoring the questionnaire.;Long Term goal: The participant improves quality of Life and PHQ9 Scores as seen by post scores and/or verbalization of changes          Quality of Life Scores:   Quality of Life - 07/01/24 1338       Quality of Life   Select Quality of Life      Quality of Life Scores   Health/Function Pre 16.12 %    Socioeconomic Pre 21.17 %    Psych/Spiritual Pre 21.21 %    Family Pre 27.6 %    GLOBAL Pre 20.1 %         Scores of 19 and below usually indicate a poorer quality of life in these areas.  A difference of  2-3 points is a clinically meaningful difference.  A difference of 2-3 points in the total score of the Quality of Life Index has been associated with significant improvement in overall quality of life, self-image, physical symptoms, and general health in studies assessing change in quality of life.  PHQ-9: Review Flowsheet       08/05/2024  Depression screen PHQ 2/9  Decreased Interest 0  Down, Depressed, Hopeless 0  PHQ - 2 Score 0  Altered sleeping 1  Tired, decreased energy 3  Change in appetite 0  Feeling bad or failure about yourself  0  Trouble concentrating 0  Moving  slowly or fidgety/restless 0  Suicidal thoughts 0  PHQ-9 Score 4  Difficult doing work/chores Somewhat difficult   Interpretation of Total Score  Total Score Depression Severity:  1-4 = Minimal depression, 5-9 = Mild depression, 10-14 = Moderate depression, 15-19 = Moderately severe depression, 20-27 = Severe depression   Psychosocial Evaluation and Intervention:  Psychosocial Evaluation - 07/01/24 1200       Psychosocial Evaluation & Interventions   Interventions Encouraged to exercise with the program and follow exercise prescription;Stress management education;Relaxation education    Comments 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 He states  he has been healing well and is ready to start getting closer to getting back to the golf course. His sleep has been a recent concern post CABG and then two weeks after his surgery he had to have a pacemaker implanted. His wife helps him manage his health care, she is a engineer, civil (consulting). He enjoys going to his beach place and golfing. He had a trip planned to [place], but had to be postponed until May because of his heart surgery. He notes that he would not have been able to walk and keep up had he gone prior to having surgery, so he is thankful they didn't go and is looking forward to the new trip dates because he feels like he will be ready to enjoy the trip more. He also plans on being in his son's wedding in April and that is a big motivation for him to get stronger. He reports no stress concerns and is ready to start the program    Expected Outcomes Short: attend cardiac rehab for education and exercise Long: develop and maintain positive self care habits    Continue Psychosocial Services  Follow up required by staff          Psychosocial Re-Evaluation:  Psychosocial Re-Evaluation     Row Name 08/05/24 [phone]             Psychosocial Re-Evaluation   Current issues with Current Stress Concerns       Comments Chase Harris reports that he doesn't sleep well,  but his sleep patterns have been this way for a while. He states he manages it ok. He has no concerns with mental health and reports that he manages stress well. He continues to have a strong support system.       Expected Outcomes Short: continue to attend cardiac rehab for mental health benefits of exercise. Long: maintain good mental health routine upon graduation from cardiac rehab.       Interventions Encouraged to attend Cardiac Rehabilitation for the exercise       Continue Psychosocial Services  Follow up required by staff          Psychosocial Discharge (Final Psychosocial Re-Evaluation):  Psychosocial Re-Evaluation - 08/05/24 0958       Psychosocial Re-Evaluation   Current issues with Current Stress Concerns    Comments Chase Harris reports that he doesn't sleep well, but his sleep patterns have been this way for a while. He states he manages it ok. He has no concerns with mental health and reports that he manages stress well. He continues to have a strong support system.    Expected Outcomes Short: continue to attend cardiac rehab for mental health benefits of exercise. Long: maintain good mental health routine upon graduation from cardiac rehab.    Interventions Encouraged to attend Cardiac Rehabilitation for the exercise    Continue Psychosocial Services  Follow up required by staff          Vocational Rehabilitation: Provide vocational rehab assistance to qualifying candidates.   Vocational Rehab Evaluation & Intervention:  Vocational Rehab - 07/01/24 0945       Initial Vocational Rehab Evaluation & Intervention   Assessment shows need for Vocational Rehabilitation No          Education: Education Goals: Education classes will be provided on a variety of topics geared toward better understanding of heart health and risk factor modification. Participant will state understanding/return demonstration of topics presented as noted by education test scores.  Learning  Barriers/Preferences:  Learning Barriers/Preferences - 07/01/24 9057       Learning Barriers/Preferences   Learning Barriers None    Learning Preferences Individual Instruction          General Cardiac Education Topics:  AED/CPR: - Group verbal and written instruction with the use of models to demonstrate the basic use of the AED with the basic ABC's of resuscitation.   Test and Procedures: - Group verbal and visual presentation and models provide information about basic cardiac anatomy and function. Reviews the testing methods done to diagnose heart disease and the outcomes of the test results. Describes the treatment choices: Medical Management, Angioplasty, or Coronary Bypass Surgery for treating various heart conditions including Myocardial Infarction, Angina, Valve Disease, and Cardiac Arrhythmias. Written material provided at class time.   Medication Safety: - Group verbal and visual instruction to review commonly prescribed medications for heart and lung disease. Reviews the medication, class of the drug, and side effects. Includes the steps to properly store meds and maintain the prescription regimen. Written material provided at class time.   Intimacy: - Group verbal instruction through game format to discuss how heart and lung disease can affect sexual intimacy. Written material provided at class time.   Know Your Numbers and Heart Failure: - Group verbal and visual instruction to discuss disease risk factors for cardiac and pulmonary disease and treatment options.  Reviews associated critical values for Overweight/Obesity, Hypertension, Cholesterol, and Diabetes.  Discusses basics of heart failure: signs/symptoms and treatments.  Introduces Heart Failure Zone chart for action plan for heart failure. Written material provided at class time.   Infection Prevention: - Provides verbal and written material to individual with discussion of infection control including proper  hand washing and proper equipment cleaning during exercise session. Flowsheet Row Cardiac Rehab from 09/04/2024 in [facility] Cardiac and Pulmonary Rehab  Date 07/01/24  Educator [facility]  Instruction Review Code 1- Verbalizes Understanding    Falls Prevention: - Provides verbal and written material to individual with discussion of falls prevention and safety. Flowsheet Row Cardiac Rehab from 09/04/2024 in [facility] Cardiac and Pulmonary Rehab  Date 07/01/24  Educator [facility]  Instruction Review Code 1- Verbalizes Understanding    Other: -Provides group and verbal instruction on various topics (see comments)   Knowledge Questionnaire Score:  Knowledge Questionnaire Score - 07/01/24 1338       Knowledge Questionnaire Score   Pre Score 25/26          Core Components/Risk Factors/Patient Goals at Admission:  Personal Goals and Risk Factors at Admission - 07/01/24 0940       Core Components/Risk Factors/Patient Goals on Admission    Weight Management Yes;Weight Loss    Intervention Weight Management: Develop a combined nutrition and exercise program designed to reach desired caloric intake, while maintaining appropriate intake of nutrient and fiber, sodium and fats, and appropriate energy expenditure required for the weight goal.;Weight Management: Provide education and appropriate resources to help participant work on and attain dietary goals.;Weight Management/Obesity: Establish reasonable short term and long term weight goals.;Obesity: Provide education and appropriate resources to help participant work on and attain dietary goals.    Admit Weight 255 lb (115.7 kg)    Goal Weight: Long Term 225 lb (102.1 kg)    Expected Outcomes Short Term: Continue to assess and modify interventions until short term weight is achieved;Long Term: Adherence to nutrition and physical activity/exercise program aimed toward attainment of established weight goal;Weight Loss: Understanding of general recommendations for a  balanced deficit meal  plan, which promotes 1-2 lb weight loss per week and includes a negative energy balance of [phone] kcal/d;Understanding recommendations for meals to include 15-35% energy as protein, 25-35% energy from fat, 35-60% energy from carbohydrates, less than 200mg  of dietary cholesterol, 20-35 gm of total fiber daily;Understanding of distribution of calorie intake throughout the day with the consumption of 4-5 meals/snacks    Diabetes Yes    Intervention Provide education about signs/symptoms and action to take for hypo/hyperglycemia.;Provide education about proper nutrition, including hydration, and aerobic/resistive exercise prescription along with prescribed medications to achieve blood glucose in normal ranges: Fasting glucose 65-99 mg/dL    Expected Outcomes Short Term: Participant verbalizes understanding of the signs/symptoms and immediate care of hyper/hypoglycemia, proper foot care and importance of medication, aerobic/resistive exercise and nutrition plan for blood glucose control.;Long Term: Attainment of HbA1C < 7%.    Hypertension Yes    Intervention Monitor prescription use compliance.;Provide education on lifestyle modifcations including regular physical activity/exercise, weight management, moderate sodium restriction and increased consumption of fresh fruit, vegetables, and low fat dairy, alcohol moderation, and smoking cessation.    Expected Outcomes Short Term: Continued assessment and intervention until BP is < 140/83mm HG in hypertensive participants. < 130/22mm HG in hypertensive participants with diabetes, heart failure or chronic kidney disease.;Long Term: Maintenance of blood pressure at goal levels.    Lipids Yes    Intervention Provide education and support for participant on nutrition & aerobic/resistive exercise along with prescribed medications to achieve LDL 70mg , HDL >40mg .    Expected Outcomes Short Term: Participant states understanding of desired  cholesterol values and is compliant with medications prescribed. Participant is following exercise prescription and nutrition guidelines.;Long Term: Cholesterol controlled with medications as prescribed, with individualized exercise RX and with personalized nutrition plan. Value goals: LDL < 70mg , HDL > 40 mg.          Education:Diabetes - Individual verbal and written instruction to review signs/symptoms of diabetes, desired ranges of glucose level fasting, after meals and with exercise. Acknowledge that pre and post exercise glucose checks will be done for 3 sessions at entry of program. Flowsheet Row Cardiac Rehab from 09/04/2024 in [facility] Cardiac and Pulmonary Rehab  Date 07/01/24  Educator [facility]  Instruction Review Code 1- Verbalizes Understanding    Core Components/Risk Factors/Patient Goals Review:   Goals and Risk Factor Review     Row Name 08/05/24 [phone]             Core Components/Risk Factors/Patient Goals Review   Personal Goals Review Lipids;Diabetes;Hypertension;Weight Management/Obesity       Review Chase Harris states that his weight has gone up a little, but he was on vacation and ate out alot. He has been trying to eat at home more to manage that. He does report that he has a BP cuff at home but does not check it regulalrly. He was encouraged to start checking it several times a week to get into the habit of keeping an eye on it. He also monitors blood sugar at home and reports it is mostly in acceptable ranges. He reports that he takes all medicaiton from DM, BP, and cholesterol. He follows up regularly with his doctor for lab work and check ups to manage his risk factors.       Expected Outcomes Short: start checking BP at home. Long: control cardiac risk factors and keep working towards weight loss goal of 245 lbs.          Core Components/Risk Factors/Patient Goals at  Discharge (Final Review):   Goals and Risk Factor Review - 08/05/24 0949       Core Components/Risk  Factors/Patient Goals Review   Personal Goals Review Lipids;Diabetes;Hypertension;Weight Management/Obesity    Review Chase Harris states that his weight has gone up a little, but he was on vacation and ate out alot. He has been trying to eat at home more to manage that. He does report that he has a BP cuff at home but does not check it regulalrly. He was encouraged to start checking it several times a week to get into the habit of keeping an eye on it. He also monitors blood sugar at home and reports it is mostly in acceptable ranges. He reports that he takes all medicaiton from DM, BP, and cholesterol. He follows up regularly with his doctor for lab work and check ups to manage his risk factors.    Expected Outcomes Short: start checking BP at home. Long: control cardiac risk factors and keep working towards weight loss goal of 245 lbs.          ITP Comments:  ITP Comments     Row Name 07/01/24 1208 07/17/24 0752 07/17/24 1021 08/14/24 0943 09/11/24 0931   ITP Comments Initital orientation and 6 MWT completed.  Initial ITP created and sent for review to Dr. Oneil Pinal, Medical Director. First full day of exercise!  Patient was oriented to gym and equipment including functions, settings, policies, and procedures.  Patient's individual exercise prescription and treatment plan were reviewed.  All starting workloads were established based on the results of the 6 minute walk test done at initial orientation visit.  The plan for exercise progression was also introduced and progression will be customized based on patient's performance and goals. 30 Day review completed. Medical Director ITP review done; changes made as directed and signed approval by Medical Director. New to program. 30 Day review completed. Medical Director ITP review done; changes made as directed and signed approval by Medical Director. 30 Day review completed. Medical Director ITP review done, changes made as directed, and signed approval by  Medical Director.      Comments: 30 day review    [1]  Current Outpatient Medications:    acetaminophen  (TYLENOL ) 500 MG tablet, Take 1,500 mg by mouth 2 (two) times daily as needed for headache or fever (pain)., Disp: , Rfl:    allopurinol (ZYLOPRIM) 300 MG tablet, Take 300 mg by mouth daily., Disp: , Rfl:    ELIQUIS  5 MG TABS tablet, Take 1 tablet (5 mg total) by mouth 2 (two) times daily., Disp: 180 tablet, Rfl: 3   etanercept (ENBREL) 50 MG/ML injection, Inject 50 mg into the skin every Wednesday., Disp: , Rfl:    Evolocumab  (REPATHA ) 140 MG/ML SOSY, Inject 140 mg into the skin See admin instructions. Inject 1mL (140mg ) into the skin every other Thursday., Disp: 2.1 mL, Rfl: 3   FARXIGA  10 MG TABS tablet, Take 1 tablet (10 mg total) by mouth daily., Disp: 90 tablet, Rfl: 3   fenofibrate 160 MG tablet, Take 160 mg by mouth daily., Disp: , Rfl:    fluticasone (FLONASE) 50 MCG/ACT nasal spray, Place 2 sprays into the nose daily as needed for allergies or rhinitis. , Disp: , Rfl:    furosemide  (LASIX ) 20 MG tablet, Take 1 tablet (20 mg total) by mouth daily., Disp: 90 tablet, Rfl: 3   gabapentin  (NEURONTIN ) 600 MG tablet, TAKE ONE TABLET (600 MG TOTAL) BY MOUTH THREE TIMES DAILY., Disp:  270 tablet, Rfl: 0   hydroxychloroquine (PLAQUENIL) 200 MG tablet, Take 200 mg by mouth 2 (two) times daily., Disp: , Rfl:    imipramine (TOFRANIL) 50 MG tablet, Take 50 mg by mouth at bedtime., Disp: , Rfl:    insulin  aspart (FIASP  FLEXTOUCH) 100 UNIT/ML FlexTouch Pen, Inject 45 Units into the skin with breakfast, with lunch, and with evening meal., Disp: , Rfl:    insulin  degludec (TRESIBA FLEXTOUCH) 100 UNIT/ML FlexTouch Pen, Inject 140 Units into the skin at bedtime., Disp: , Rfl:    loratadine (CLARITIN) 10 MG tablet, Take 10 mg by mouth daily. , Disp: , Rfl:    metFORMIN (GLUCOPHAGE) 1000 MG tablet, Take 1,000 mg by mouth 2 (two) times a day. , Disp: , Rfl:    metoprolol  tartrate (LOPRESSOR ) 50 MG  tablet, Take 1 tablet (50 mg total) by mouth 2 (two) times daily., Disp: 180 tablet, Rfl: 3   Multiple Vitamins-Minerals (MULTIVITAMIN MEN 50+) TABS, Take 1 tablet by mouth daily., Disp: , Rfl:    nystatin  (MYCOSTATIN ) 100000 UNIT/ML suspension, Take 4 mLs by mouth 4 (four) times daily. 10 day course, Disp: , Rfl:    omeprazole (PRILOSEC) 40 MG capsule, Take 40 mg by mouth daily., Disp: , Rfl:    oxyCODONE  (OXY IR/ROXICODONE ) 5 MG immediate release tablet, Take 5 mg by mouth every 4 (four) hours as needed for severe pain (pain score 7-10)., Disp: , Rfl:    predniSONE (DELTASONE) 2.5 MG tablet, Take 2.5 mg by mouth daily with breakfast., Disp: , Rfl:    spironolactone  (ALDACTONE ) 25 MG tablet, Take 1 tablet (25 mg total) by mouth daily., Disp: 90 tablet, Rfl: 3 [2]  Social History Tobacco Use  Smoking Status Former  Smokeless Tobacco Never

## 2024-09-13 ENCOUNTER — Encounter

## 2024-09-16 ENCOUNTER — Encounter

## 2024-09-16 DIAGNOSIS — Z951 Presence of aortocoronary bypass graft: Secondary | ICD-10-CM

## 2024-09-16 NOTE — Progress Notes (Signed)
 Daily Session Note  Patient Details  Name: Chase Harris MRN: [id number] Date of Birth: Sep 23, 1961 Referring Provider:   Flowsheet Row Cardiac Rehab from 07/01/2024 in [facility] Cardiac and Pulmonary Rehab  Referring Provider Dr. Evalene Lunger, MD    Encounter Date: 09/16/2024  Check In:  Session Check In - 09/16/24 0931       Check-In   Supervising physician immediately available to respond to emergencies See telemetry face sheet for immediately available ER MD    Location [facility]-Cardiac & Pulmonary Rehab    Staff Present Burnard Davenport [facility] Peggi, RN, DNP, NE-BC;Laura Cates RN,BSN;Markevion Lattin Dyane BS, ACSM CEP, Exercise Physiologist;Kristen Coble RN,BC,MSN    Virtual Visit No    Medication changes reported     No    Fall or balance concerns reported    No    Warm-up and Cool-down Performed on first and last piece of equipment    Resistance Training Performed Yes    VAD Patient? No    PAD/SET Patient? No      Pain Assessment   Currently in Pain? No/denies             Tobacco Use History[1]  Goals Met:  Independence with exercise equipment Exercise tolerated well No report of concerns or symptoms today Strength training completed today  Goals Unmet:  Not Applicable  Comments: Pt able to follow exercise prescription today without complaint.  Will continue to monitor for progression.    Dr. Oneil Pinal is Medical Director for [facility] Cardiac Rehabilitation.  Dr. Fuad Aleskerov is Medical Director for [facility] Pulmonary Rehabilitation.    [1]  Social History Tobacco Use  Smoking Status Former  Smokeless Tobacco Never

## 2024-09-18 ENCOUNTER — Encounter

## 2024-09-23 ENCOUNTER — Encounter

## 2024-09-23 ENCOUNTER — Other Ambulatory Visit: Payer: Self-pay | Admitting: Podiatry

## 2024-09-23 DIAGNOSIS — Z951 Presence of aortocoronary bypass graft: Secondary | ICD-10-CM | POA: Diagnosis not present

## 2024-09-23 NOTE — Progress Notes (Signed)
 Daily Session Note  Patient Details  Name: Chase Harris MRN: [id number] Date of Birth: [birth year] Referring Provider:   Flowsheet Row Cardiac Rehab from 07/01/2024 in [facility] Cardiac and Pulmonary Rehab  Referring Provider Dr. Evalene Lunger, MD    Encounter Date: 09/23/2024  Check In:  Session Check In - 09/23/24 0936       Check-In   Supervising physician immediately available to respond to emergencies See telemetry face sheet for immediately available ER MD    Location [facility]-Cardiac & Pulmonary Rehab    Staff Present Burnard Davenport RN,BSN,MPA;Maxon Conetta BS, Exercise Physiologist;Laureen Delores, BS, RRT, CPFT;Joseph Rolinda RCP,RRT,BSRT    Virtual Visit No    Medication changes reported     No    Fall or balance concerns reported    No    Warm-up and Cool-down Performed on first and last piece of equipment    Resistance Training Performed Yes    VAD Patient? No    PAD/SET Patient? No      Pain Assessment   Currently in Pain? No/denies             Tobacco Use History[1]  Goals Met:  Independence with exercise equipment Exercise tolerated well No report of concerns or symptoms today Strength training completed today  Goals Unmet:  Not Applicable  Comments: Pt able to follow exercise prescription today without complaint.  Will continue to monitor for progression.    Dr. Oneil Pinal is Medical Director for [facility] Cardiac Rehabilitation.  Dr. Fuad Aleskerov is Medical Director for [facility] Pulmonary Rehabilitation.    [1]  Social History Tobacco Use  Smoking Status Former  Smokeless Tobacco Never

## 2024-09-25 ENCOUNTER — Encounter

## 2024-09-25 ENCOUNTER — Ambulatory Visit: Admitting: Cardiology

## 2024-09-27 ENCOUNTER — Encounter

## 2024-09-30 ENCOUNTER — Encounter: Payer: Self-pay | Attending: Cardiovascular Disease | Admitting: *Deleted

## 2024-09-30 ENCOUNTER — Encounter

## 2024-09-30 DIAGNOSIS — Z48812 Encounter for surgical aftercare following surgery on the circulatory system: Secondary | ICD-10-CM | POA: Insufficient documentation

## 2024-09-30 DIAGNOSIS — Z951 Presence of aortocoronary bypass graft: Secondary | ICD-10-CM | POA: Insufficient documentation

## 2024-09-30 NOTE — Progress Notes (Signed)
 Daily Session Note  Patient Details  Name: BRYEN HINDERMAN MRN: [id number] Date of Birth: 1961-03-17 Referring Provider:   Flowsheet Row Cardiac Rehab from 07/01/2024 in [facility] Cardiac and Pulmonary Rehab  Referring Provider Dr. Evalene Lunger, MD    Encounter Date: 09/30/2024  Check In:  Session Check In - 09/30/24 1016       Check-In   Supervising physician immediately available to respond to emergencies See telemetry face sheet for immediately available ER MD    Location [facility]-Cardiac & Pulmonary Rehab    Staff Present Othel Durand, RN, BSN, CCRP;Joseph Hood RCP,RRT,BSRT;Maxon [employer], Exercise Physiologist;Kelly Bluelinx, ACSM CEP, Exercise Physiologist    Virtual Visit No    Medication changes reported     No    Fall or balance concerns reported    No    Warm-up and Cool-down Performed on first and last piece of equipment    Resistance Training Performed Yes    VAD Patient? No    PAD/SET Patient? No      Pain Assessment   Currently in Pain? No/denies             Tobacco Use History[1]  Goals Met:  Independence with exercise equipment Exercise tolerated well No report of concerns or symptoms today  Goals Unmet:  Not Applicable  Comments: Pt able to follow exercise prescription today without complaint.  Will continue to monitor for progression.    Dr. Oneil Pinal is Medical Director for [facility] Cardiac Rehabilitation.  Dr. Fuad Aleskerov is Medical Director for [facility] Pulmonary Rehabilitation.    [1]  Social History Tobacco Use  Smoking Status Former  Smokeless Tobacco Never

## 2024-10-02 ENCOUNTER — Encounter: Payer: Self-pay | Admitting: Emergency Medicine

## 2024-10-02 VITALS — Ht 69.0 in | Wt 273.8 lb

## 2024-10-02 DIAGNOSIS — Z951 Presence of aortocoronary bypass graft: Secondary | ICD-10-CM

## 2024-10-02 NOTE — Patient Instructions (Signed)
 Discharge Patient Instructions  Patient Details  Name: Chase Harris MRN: [id number] Date of Birth: 1961-03-22 Referring Provider:  Alla Amis, MD   Number of Visits: 22  Reason for Discharge:  Early Exit:  Insurance  Diagnosis:  S/P CABG x 4  Initial Exercise Prescription:  Initial Exercise Prescription - 07/01/24 1300       Date of Initial Exercise RX and Referring Provider   Date 07/01/24    Referring Provider Dr. Timothy Gollan, MD      Oxygen   Maintain Oxygen Saturation 88% or higher      Treadmill   MPH 2    Grade 0    Minutes 15    METs 2.53      NuStep   Level 2   T6 nustep   SPM 80    Minutes 15    METs 2.28      REL-XR   Level 2    Speed 50    Minutes 15    METs 2.28      Prescription Details   Frequency (times per week) 3    Duration Progress to 30 minutes of continuous aerobic without signs/symptoms of physical distress      Intensity   THRR 40-80% of Max Heartrate 106-139    Ratings of Perceived Exertion 11-13    Perceived Dyspnea 0-4      Progression   Progression Continue to progress workloads to maintain intensity without signs/symptoms of physical distress.      Resistance Training   Training Prescription Yes    Weight 5 lb    Reps 10-15          Discharge Exercise Prescription (Final Exercise Prescription Changes):  Exercise Prescription Changes - 09/30/24 1500       Response to Exercise   Blood Pressure (Admit) 122/68    Blood Pressure (Exit) 124/52    Heart Rate (Admit) 102 bpm    Heart Rate (Exercise) 115 bpm    Heart Rate (Exit) 108 bpm    Rating of Perceived Exertion (Exercise) 11    Symptoms none    Duration Continue with 30 min of aerobic exercise without signs/symptoms of physical distress.    Intensity THRR unchanged      Progression   Progression Continue to progress workloads to maintain intensity without signs/symptoms of physical distress.    Average METs 2.47      Resistance Training    Weight 5 lb    Reps 10-15      Interval Training   Interval Training No      Treadmill   MPH 1.6    Grade 0    Minutes 15    METs 2.23      NuStep   Level 2    Minutes 15    METs 2.7      Home Exercise Plan   Plans to continue exercise at [employer] (comment)   [employer]   Frequency Add 2 additional days to program exercise sessions.    Initial Home Exercises Provided 08/05/24      Oxygen   Maintain Oxygen Saturation 88% or higher          Functional Capacity:  6 Minute Walk     Row Name 07/01/24 1329 10/02/24 1106       6 Minute Walk   Phase Initial Discharge    Distance 1005 feet 1310 feet    Distance % Change -- 30 %    Distance Feet  Change -- 305 ft    Walk Time 6 minutes --    # of Rest Breaks 0 0    MPH 1.9 2.5    METS 2.28 2.72    RPE 13 12    Perceived Dyspnea  0 0    VO2 Peak 7.99 9.5    Symptoms Yes (comment) No    Comments lightheadedness --    Resting HR 74 bpm 98 bpm    Resting BP 126/68 124/64    Resting Oxygen Saturation  96 % 94 %    Exercise Oxygen Saturation  during 6 min walk 96 % 99 %    Max Ex. HR 101 bpm 115 bpm    Max Ex. BP 152/68 142/64    2 Minute Post BP 134/66 --      Nutrition & Weight - Outcomes:  Pre Biometrics - 07/01/24 1331       Pre Biometrics   Height 5' 9 (1.753 m)    Weight 255 lb 4.8 oz (115.8 kg)    Waist Circumference 48 inches    Hip Circumference 44 inches    Waist to Hip Ratio 1.09 %    BMI (Calculated) 37.68    Single Leg Stand 4.5 seconds          Post Biometrics - 10/02/24 1109        Post  Biometrics   Height 5' 9 (1.753 m)    Weight 273 lb 12.8 oz (124.2 kg)    Waist Circumference 50 inches    Hip Circumference 48 inches    Waist to Hip Ratio 1.04 %    BMI (Calculated) 40.41    Single Leg Stand 9 seconds         Goals reviewed with patient; copy given to patient.

## 2024-10-02 NOTE — Progress Notes (Signed)
 Cardiac Individual Treatment Plan  Patient Details  Name: Chase Harris MRN: [id number] Date of Birth: 01-26-1961 Referring Provider:   Flowsheet Row Cardiac Rehab from 07/01/2024 in [facility] Cardiac and Pulmonary Rehab  Referring Provider Dr. Evalene Lunger, MD    Initial Encounter Date:  Flowsheet Row Cardiac Rehab from 07/01/2024 in [facility] Cardiac and Pulmonary Rehab  Date 07/01/24    Visit Diagnosis: S/P CABG x 4  Patient's Home Medications on Admission: Current Medications[1]  Past Medical History: Past Medical History:  Diagnosis Date   Arthritis    Chronic kidney disease    Constipation    Diabetes mellitus without complication (HCC)    Metformin   Diabetic peripheral neuropathy (HCC)    GERD (gastroesophageal reflux disease)    History of prostate cancer    Hypertension    Peripheral vascular disease    Prostate cancer (HCC)    Sleep apnea    has cpap   Stroke [facility])    mini stroke 2016 - no deficitis    Tobacco Use: Tobacco Use History[2]  Labs: Review Flowsheet       Latest Ref Rng & Units 03/06/2013 04/07/2019 01/31/2023 06/17/2024  Labs for ITP Cardiac and Pulmonary Rehab  Cholestrol 0 - 200 mg/dL 824  - - 97   LDL (calc) 0 - 99 mg/dL 93  - - 29   HDL-C >59 mg/dL 34  - - 43   Trlycerides <150 mg/dL 761  - - 876   Hemoglobin A1c 4.8 - 5.6 % 10.3  8.2  7.3  7.4      Exercise Target Goals: Exercise Program Goal: Individual exercise prescription set using results from initial 6 min walk test and THRR while considering  patients activity barriers and safety.   Exercise Prescription Goal: Initial exercise prescription builds to 30-45 minutes a day of aerobic activity, 2-3 days per week.  Home exercise guidelines will be given to patient during program as part of exercise prescription that the participant will acknowledge.   Education: Aerobic Exercise: - Group verbal and visual presentation on the components of exercise prescription. Introduces F.I.T.T  principle from ACSM for exercise prescriptions.  Reviews F.I.T.T. principles of aerobic exercise including progression. Written material provided at class time. Flowsheet Row Cardiac Rehab from 10/02/2024 in [facility] Cardiac and Pulmonary Rehab  Date 09/16/24  Educator mb  Instruction Review Code 1- Verbalizes Understanding    Education: Resistance Exercise: - Group verbal and visual presentation on the components of exercise prescription. Introduces F.I.T.T principle from ACSM for exercise prescriptions  Reviews F.I.T.T. principles of resistance exercise including progression. Written material provided at class time.    Education: Exercise & Equipment Safety: - Individual verbal instruction and demonstration of equipment use and safety with use of the equipment. Flowsheet Row Cardiac Rehab from 10/02/2024 in [facility] Cardiac and Pulmonary Rehab  Date 07/01/24  Educator [facility]  Instruction Review Code 1- Verbalizes Understanding    Education: Exercise Physiology & General Exercise Guidelines: - Group verbal and written instruction with models to review the exercise physiology of the cardiovascular system and associated critical values. Provides general exercise guidelines with specific guidelines to those with heart or lung disease. Written material provided at class time. Flowsheet Row Cardiac Rehab from 10/02/2024 in [facility] Cardiac and Pulmonary Rehab  Date 09/04/24  Educator nt  Instruction Review Code 1- Tefl Teacher Understanding    Education: Flexibility, Balance, Mind/Body Relaxation: - Group verbal and visual presentation with interactive activity on the components of exercise prescription. Introduces F.I.T.T  principle from ACSM for exercise prescriptions. Reviews F.I.T.T. principles of flexibility and balance exercise training including progression. Also discusses the mind body connection.  Reviews various relaxation techniques to help reduce and manage stress (i.e. Deep breathing, progressive muscle  relaxation, and visualization). Balance handout provided to take home. Written material provided at class time.   Activity Barriers & Risk Stratification:  Activity Barriers & Cardiac Risk Stratification - 07/01/24 1330       Activity Barriers & Cardiac Risk Stratification   Activity Barriers Left Knee Replacement;Joint Problems;Deconditioning;Arthritis;Balance Concerns;Other (comment)    Comments Plantar Fasciitis    Cardiac Risk Stratification High          6 Minute Walk:  6 Minute Walk     Row Name 07/01/24 1329 10/02/24 1106       6 Minute Walk   Phase Initial Discharge    Distance 1005 feet 1310 feet    Distance % Change -- 30 %    Distance Feet Change -- 305 ft    Walk Time 6 minutes --    # of Rest Breaks 0 0    MPH 1.9 2.5    METS 2.28 2.72    RPE 13 12    Perceived Dyspnea  0 0    VO2 Peak 7.99 9.5    Symptoms Yes (comment) No    Comments lightheadedness --    Resting HR 74 bpm 98 bpm    Resting BP 126/68 124/64    Resting Oxygen Saturation  96 % 94 %    Exercise Oxygen Saturation  during 6 min walk 96 % 99 %    Max Ex. HR 101 bpm 115 bpm    Max Ex. BP 152/68 142/64    2 Minute Post BP 134/66 --       Oxygen Initial Assessment:   Oxygen Re-Evaluation:   Oxygen Discharge (Final Oxygen Re-Evaluation):   Initial Exercise Prescription:  Initial Exercise Prescription - 07/01/24 1300       Date of Initial Exercise RX and Referring Provider   Date 07/01/24    Referring Provider Dr. Timothy Gollan, MD      Oxygen   Maintain Oxygen Saturation 88% or higher      Treadmill   MPH 2    Grade 0    Minutes 15    METs 2.53      NuStep   Level 2   T6 nustep   SPM 80    Minutes 15    METs 2.28      REL-XR   Level 2    Speed 50    Minutes 15    METs 2.28      Prescription Details   Frequency (times per week) 3    Duration Progress to 30 minutes of continuous aerobic without signs/symptoms of physical distress      Intensity   THRR 40-80%  of Max Heartrate 106-139    Ratings of Perceived Exertion 11-13    Perceived Dyspnea 0-4      Progression   Progression Continue to progress workloads to maintain intensity without signs/symptoms of physical distress.      Resistance Training   Training Prescription Yes    Weight 5 lb    Reps 10-15          Perform Capillary Blood Glucose checks as needed.  Exercise Prescription Changes:   Exercise Prescription Changes     Row Name 07/01/24 1300 07/30/24 1500 08/05/24 0900 08/14/24 1500 08/29/24 1000  Response to Exercise   Blood Pressure (Admit) 126/68 120/72 -- 130/62 110/72   Blood Pressure (Exercise) 152/68 144/72 -- 138/76 114/78   Blood Pressure (Exit) 134/66 102/60 -- 116/70 114/60   Heart Rate (Admit) 74 bpm 94 bpm -- 97 bpm 108 bpm   Heart Rate (Exercise) 101 bpm 123 bpm -- 120 bpm 114 bpm   Heart Rate (Exit) 83 bpm 95 bpm -- 94 bpm 97 bpm   Oxygen Saturation (Admit) 96 % -- -- -- --   Oxygen Saturation (Exercise) 96 % -- -- -- --   Rating of Perceived Exertion (Exercise) 13 14 -- 12 12   Perceived Dyspnea (Exercise) 0 -- -- -- --   Symptoms lightheadedness none -- none none   Comments Results 1st 2 weeks of exercise sessions -- -- --   Duration -- Progress to 30 minutes of  aerobic without signs/symptoms of physical distress Progress to 30 minutes of  aerobic without signs/symptoms of physical distress Continue with 30 min of aerobic exercise without signs/symptoms of physical distress. Continue with 30 min of aerobic exercise without signs/symptoms of physical distress.   Intensity -- THRR unchanged THRR unchanged THRR unchanged THRR unchanged     Progression   Progression -- Continue to progress workloads to maintain intensity without signs/symptoms of physical distress. Continue to progress workloads to maintain intensity without signs/symptoms of physical distress. Continue to progress workloads to maintain intensity without signs/symptoms of physical  distress. Continue to progress workloads to maintain intensity without signs/symptoms of physical distress.   Average METs -- 2.31 2.31 2.53 2.6     Resistance Training   Training Prescription -- Yes Yes Yes Yes   Weight -- 5 lb 5 lb 5 lb 5 lb   Reps -- 10-15 10-15 10-15 10-15     Interval Training   Interval Training -- No No No No     Treadmill   MPH -- 2 2 2 2    Grade -- 0 0 0 0   Minutes -- 15 15 15 15    METs -- 2.53 2.53 2.53 2.53     Recumbant Bike   Level -- 3 3 -- --   RPM -- 50 50 -- --   Watts -- 19 19 -- --   Minutes -- 15 15 -- --   METs -- 2.51 2.51 -- --     NuStep   Level -- -- -- 2  T6 nustep --   Minutes -- -- -- 15 --   METs -- -- -- 2.3 --     REL-XR   Level -- 2 2 -- 1   Minutes -- 15 15 -- 15   METs -- 2.3 2.3 -- 4.2     T5 Nustep   Level -- 2 2 3 2    SPM -- 80 80 -- --   Minutes -- 15 15 15 15    METs -- 2 2 3  1.9     Biostep-RELP   Level -- -- -- -- 2   Minutes -- -- -- -- 15   METs -- -- -- -- 2     Home Exercise Plan   Plans to continue exercise at -- -- [employer] (comment)  [employer] (comment)  [employer] (comment)  [employer]   Frequency -- -- Add 2 additional days to program exercise sessions. Add 2 additional days to program exercise sessions. Add 2 additional days to program exercise sessions.   Initial Home  Exercises Provided -- -- 08/05/24 08/05/24 08/05/24     Oxygen   Maintain Oxygen Saturation -- 88% or higher 88% or higher 88% or higher 88% or higher    Row Name 09/11/24 1100 09/30/24 1500           Response to Exercise   Blood Pressure (Admit) 124/68 122/68      Blood Pressure (Exit) 118/64 124/52      Heart Rate (Admit) 98 bpm 102 bpm      Heart Rate (Exercise) 115 bpm 115 bpm      Heart Rate (Exit) 98 bpm 108 bpm      Rating of Perceived Exertion (Exercise) 12 11      Symptoms none none      Duration Continue with 30 min of aerobic exercise without  signs/symptoms of physical distress. Continue with 30 min of aerobic exercise without signs/symptoms of physical distress.      Intensity THRR unchanged THRR unchanged        Progression   Progression Continue to progress workloads to maintain intensity without signs/symptoms of physical distress. Continue to progress workloads to maintain intensity without signs/symptoms of physical distress.      Average METs 3.39 2.47        Resistance Training   Weight 5 lb 5 lb      Reps 10-15 10-15        Interval Training   Interval Training No No        Treadmill   MPH 2.2 1.6      Grade 0 0      Minutes 15 15      METs 2.69 2.23        NuStep   Level -- 2      Minutes -- 15      METs -- 2.7        REL-XR   Level 2 --      Minutes 15 --      METs 4.8 --        Home Exercise Plan   Plans to continue exercise at [employer] (comment)  [employer] (comment)  [employer]      Frequency Add 2 additional days to program exercise sessions. Add 2 additional days to program exercise sessions.      Initial Home Exercises Provided 08/05/24 08/05/24        Oxygen   Maintain Oxygen Saturation 88% or higher 88% or higher         Exercise Comments:   Exercise Comments     Row Name 07/17/24 9247           Exercise Comments First full day of exercise!  Patient was oriented to gym and equipment including functions, settings, policies, and procedures.  Patient's individual exercise prescription and treatment plan were reviewed.  All starting workloads were established based on the results of the 6 minute walk test done at initial orientation visit.  The plan for exercise progression was also introduced and progression will be customized based on patient's performance and goals.          Exercise Goals and Review:   Exercise Goals     Row Name 07/01/24 1331             Exercise Goals   Increase Physical Activity Yes       Intervention Develop an  individualized exercise prescription for aerobic and resistive training based on initial evaluation findings, risk stratification, comorbidities  and participant's personal goals.;Provide advice, education, support and counseling about physical activity/exercise needs.       Expected Outcomes Short Term: Attend rehab on a regular basis to increase amount of physical activity.;Long Term: Add in home exercise to make exercise part of routine and to increase amount of physical activity.;Long Term: Exercising regularly at least 3-5 days a week.       Increase Strength and Stamina Yes       Intervention Provide advice, education, support and counseling about physical activity/exercise needs.;Develop an individualized exercise prescription for aerobic and resistive training based on initial evaluation findings, risk stratification, comorbidities and participant's personal goals.       Expected Outcomes Short Term: Increase workloads from initial exercise prescription for resistance, speed, and METs.;Short Term: Perform resistance training exercises routinely during rehab and add in resistance training at home;Long Term: Improve cardiorespiratory fitness, muscular endurance and strength as measured by increased METs and functional capacity ( )       Able to understand and use rate of perceived exertion (RPE) scale Yes       Intervention Provide education and explanation on how to use RPE scale       Expected Outcomes Short Term: Able to use RPE daily in rehab to express subjective intensity level;Long Term:  Able to use RPE to guide intensity level when exercising independently       Able to understand and use Dyspnea scale Yes       Intervention Provide education and explanation on how to use Dyspnea scale       Expected Outcomes Short Term: Able to use Dyspnea scale daily in rehab to express subjective sense of shortness of breath during exertion;Long Term: Able to use Dyspnea scale to guide intensity level  when exercising independently       Knowledge and understanding of Target Heart Rate Range (THRR) Yes       Intervention Provide education and explanation of THRR including how the numbers were predicted and where they are located for reference       Expected Outcomes Short Term: Able to state/look up THRR;Long Term: Able to use THRR to govern intensity when exercising independently;Short Term: Able to use daily as guideline for intensity in rehab       Able to check pulse independently Yes       Intervention Provide education and demonstration on how to check pulse in carotid and radial arteries.;Review the importance of being able to check your own pulse for safety during independent exercise       Expected Outcomes Short Term: Able to explain why pulse checking is important during independent exercise;Long Term: Able to check pulse independently and accurately       Understanding of Exercise Prescription Yes       Intervention Provide education, explanation, and written materials on patient's individual exercise prescription       Expected Outcomes Short Term: Able to explain program exercise prescription;Long Term: Able to explain home exercise prescription to exercise independently          Exercise Goals Re-Evaluation :  Exercise Goals Re-Evaluation     Row Name 07/17/24 0752 07/30/24 1528 08/05/24 0941 08/14/24 1548 08/29/24 1048     Exercise Goal Re-Evaluation   Exercise Goals Review Increase Physical Activity;Able to understand and use rate of perceived exertion (RPE) scale;Knowledge and understanding of Target Heart Rate Range (THRR);Understanding of Exercise Prescription;Increase Strength and Stamina;Able to understand and use Dyspnea scale;Able to check pulse independently  Increase Physical Activity;Understanding of Exercise Prescription;Increase Strength and Stamina Increase Physical Activity;Increase Strength and Stamina;Able to understand and use rate of perceived exertion (RPE)  scale;Able to understand and use Dyspnea scale;Knowledge and understanding of Target Heart Rate Range (THRR);Able to check pulse independently;Understanding of Exercise Prescription Increase Physical Activity;Increase Strength and Stamina;Understanding of Exercise Prescription Increase Physical Activity;Increase Strength and Stamina;Understanding of Exercise Prescription   Comments Reviewed RPE and dyspnea scale, THR and program prescription with pt today.  Pt voiced understanding and was given a copy of goals to take home. Chase Harris is off to a good start in the program and completed his 1st 2 weeks of exercise sessions. He was able to do a speed of 2 mph on the treadmill with no incline. He also worked at level 3 on the recumbent bike, level 2 on the T5 nustep, and level 2 on the XR. We will continue to monitor his progress in the program. Reviewed home exercise with pt today.  Pt plans to go to [employer] for exercise.  Reviewed THR, pulse, RPE, sign and symptoms, pulse oximetery and when to call 911 or MD.  Also discussed weather considerations and indoor options.  Pt voiced understanding. Chase Harris is doing well in rehab. He continues to walk on the treadmill at a speed of 2 mph with no incline. He also improved to level 3 on the T5 nustep and continues to us  5 lb hand weights for resistance training. We will continue to monitor his progress in the program. Chase Harris is doing well in rehab. He has been able to maintain his workload on the treadmill at a speed of and no incline. He was also able to [facility] level 2 on both the T5 nustep and biostep. We will continue to monitor his progress in the program.   Expected Outcomes Short: Use RPE daily to regulate intensity. Long: Follow program prescription in THR. Short: Continue to follow current exercise prescription. Long: Continue exercise to improve strength and stamina. Short: add 1-2 days a week of exercise on off days of rehab. Long: maintain indpendent exercise  routine upon graduation from cardiac rehab. Short: Add incline to treadmill workload. Long: Continue exercise to improve strength and stamina. Short: Continue to work to improve treadmill workload. Long: Continue exercise to improve strength and stamina.    Row Name 09/11/24 1123 09/30/24 1539           Exercise Goal Re-Evaluation   Exercise Goals Review Increase Physical Activity;Increase Strength and Stamina;Understanding of Exercise Prescription Increase Physical Activity;Increase Strength and Stamina;Understanding of Exercise Prescription      Comments Chase Harris continues to do well in rehab. He recently increased his speed on the treadmill to 2.2 mph with no incline. He also improved back up to level 2 on the XR. We will continue to monitor his progress in the program. Chase Harris only attended one session since the last review. During his one session he did well on the treadmill at a speed of 1.6 mph with no incline. He also continues to do well with 5 lb hand weights for resistance training. We will continue to monitor his progress in the program.      Expected Outcomes Short: Increase to level 3 on the XR. Long: Continue exercise to improve strength and stamina. Short: Return to consistent attendence in rehab. Long: Continue exercise to improve strength and stamina.         Discharge Exercise Prescription (Final Exercise Prescription Changes):  Exercise Prescription Changes - 09/30/24 1500  Response to Exercise   Blood Pressure (Admit) 122/68    Blood Pressure (Exit) 124/52    Heart Rate (Admit) 102 bpm    Heart Rate (Exercise) 115 bpm    Heart Rate (Exit) 108 bpm    Rating of Perceived Exertion (Exercise) 11    Symptoms none    Duration Continue with 30 min of aerobic exercise without signs/symptoms of physical distress.    Intensity THRR unchanged      Progression   Progression Continue to progress workloads to maintain intensity without signs/symptoms of physical distress.     Average METs 2.47      Resistance Training   Weight 5 lb    Reps 10-15      Interval Training   Interval Training No      Treadmill   MPH 1.6    Grade 0    Minutes 15    METs 2.23      NuStep   Level 2    Minutes 15    METs 2.7      Home Exercise Plan   Plans to continue exercise at [employer] (comment)   [employer]   Frequency Add 2 additional days to program exercise sessions.    Initial Home Exercises Provided 08/05/24      Oxygen   Maintain Oxygen Saturation 88% or higher          Nutrition:  Target Goals: Understanding of nutrition guidelines, daily intake of sodium 1500mg , cholesterol 200mg , calories 30% from fat and 7% or less from saturated fats, daily to have 5 or more servings of fruits and vegetables.  Education: Nutrition 1 -Group instruction provided by verbal, written material, interactive activities, discussions, models, and posters to present general guidelines for heart healthy nutrition including macronutrients, label reading, and promoting whole foods over processed counterparts. Education serves as pensions consultant of discussion of heart healthy eating for all. Written material provided at class time. Flowsheet Row Cardiac Rehab from 10/02/2024 in [facility] Cardiac and Pulmonary Rehab  Date 07/17/24  Educator jg  Instruction Review Code 1- Verbalizes Understanding     Education: Nutrition 2 -Group instruction provided by verbal, written material, interactive activities, discussions, models, and posters to present general guidelines for heart healthy nutrition including sodium, cholesterol, and saturated fat. Providing guidance of habit forming to improve blood pressure, cholesterol, and body weight. Written material provided at class time.     Biometrics:  Pre Biometrics - 07/01/24 1331       Pre Biometrics   Height 5' 9 (1.753 m)    Weight 255 lb 4.8 oz (115.8 kg)    Waist Circumference 48 inches    Hip Circumference 44 inches    Waist  to Hip Ratio 1.09 %    BMI (Calculated) 37.68    Single Leg Stand 4.5 seconds          Post Biometrics - 10/02/24 1109        Post  Biometrics   Height 5' 9 (1.753 m)    Weight 273 lb 12.8 oz (124.2 kg)    Waist Circumference 50 inches    Hip Circumference 48 inches    Waist to Hip Ratio 1.04 %    BMI (Calculated) 40.41    Single Leg Stand 9 seconds          Nutrition Therapy Plan and Nutrition Goals:  Nutrition Therapy & Goals - 07/01/24 1341       Nutrition Therapy   Protein (specify units) 80  Fiber 30 grams    Whole Grain Foods 3 servings    Saturated Fats 15 max. grams    Fruits and Vegetables 5 servings/day    Sodium 2 grams      Personal Nutrition Goals   Nutrition Goal Read labels and reduce sodium intake to below 2300mg . Ideally 1500mg  per day.    Personal Goal #2 Reduce saturated fat, less than 12g per day. Replace bad fats for more heart healthy fats.    Personal Goal #3 Include more colorful produce, aim for 5-8 servings of fruits and veggies per day    Comments Patient drinking mostly unsweet tea, getting about 2 bottles of water in per day. He usually eat 3 meals per day. His wife is a [employer] and tries to help him eat heart healthy and keep his DM under control. Spoke with about eating carb smart versus eating for heart health. Reviewed mediterranean diet handout, provided guidelines of less than 1500mg  sodium and less than 12g saturated fat per day, Educated on types of fats,sources, and how to read facts labels. Brainstormed a few meals with a focus on reducing sodium and saturated fat and including more colorful produce. A chicken pasta with side salad as an example with half as much sodium and saturated fat as the country ham sandwich while still the same amount of calories and carbs.      Intervention Plan   Intervention Prescribe, educate and counsel regarding individualized specific dietary modifications aiming towards targeted core components such as  weight, hypertension, lipid management, diabetes, heart failure and other comorbidities.;Nutrition handout(s) given to patient.    Expected Outcomes Short Term Goal: Understand basic principles of dietary content, such as calories, fat, sodium, cholesterol and nutrients.;Short Term Goal: A plan has been developed with personal nutrition goals set during dietitian appointment.;Long Term Goal: Adherence to prescribed nutrition plan.          Nutrition Assessments:  MEDIFICTS Score Key: >=70 Need to make dietary changes  40-70 Heart Healthy Diet <= 40 Therapeutic Level Cholesterol Diet  Flowsheet Row Cardiac Rehab from 07/01/2024 in [facility] Cardiac and Pulmonary Rehab  Picture Your Plate Total Score on Admission 68   Picture Your Plate Scores: <59 Unhealthy dietary pattern with much room for improvement. 41-50 Dietary pattern unlikely to meet recommendations for good health and room for improvement. 51-60 More healthful dietary pattern, with some room for improvement.  >60 Healthy dietary pattern, although there may be some specific behaviors that could be improved.    Nutrition Goals Re-Evaluation:  Nutrition Goals Re-Evaluation     Row Name 08/05/24 0943             Goals   Comment Chase Harris is getting more comfortable reading food labels and reports that he is watching sodium and saturated fats. He is doing well with eating more vegetables but not so much fruit, but he states that is because he is also watching his blood sugars.       Expected Outcome Short:continue to get more comfortable reading food labels. Long: maintain heart healthy diet.          Nutrition Goals Discharge (Final Nutrition Goals Re-Evaluation):  Nutrition Goals Re-Evaluation - 08/05/24 0943       Goals   Comment Chase Harris is getting more comfortable reading food labels and reports that he is watching sodium and saturated fats. He is doing well with eating more vegetables but not so much fruit, but he states that  is because he is  also watching his blood sugars.    Expected Outcome Short:continue to get more comfortable reading food labels. Long: maintain heart healthy diet.          Psychosocial: Target Goals: Acknowledge presence or absence of significant depression and/or stress, maximize coping skills, provide positive support system. Participant is able to verbalize types and ability to use techniques and skills needed for reducing stress and depression.   Education: Stress, Anxiety, and Depression - Group verbal and visual presentation to define topics covered.  Reviews how body is impacted by stress, anxiety, and depression.  Also discusses healthy ways to reduce stress and to treat/manage anxiety and depression. Written material provided at class time.   Education: Sleep Hygiene -Provides group verbal and written instruction about how sleep can affect your health.  Define sleep hygiene, discuss sleep cycles and impact of sleep habits. Review good sleep hygiene tips.   Initial Review & Psychosocial Screening:  Initial Psych Review & Screening - 07/01/24 0946       Initial Review   Current issues with Current Sleep Concerns      Family Dynamics   Good Support System? Yes      Barriers   Psychosocial barriers to participate in program There are no identifiable barriers or psychosocial needs.      Screening Interventions   Interventions Encouraged to exercise;Provide feedback about the scores to participant    Expected Outcomes Short Term goal: Utilizing psychosocial counselor, staff and physician to assist with identification of specific Stressors or current issues interfering with healing process. Setting desired goal for each stressor or current issue identified.;Long Term Goal: Stressors or current issues are controlled or eliminated.;Short Term goal: Identification and review with participant of any Quality of Life or Depression concerns found by scoring the questionnaire.;Long Term  goal: The participant improves quality of Life and PHQ9 Scores as seen by post scores and/or verbalization of changes          Quality of Life Scores:   Quality of Life - 07/01/24 1338       Quality of Life   Select Quality of Life      Quality of Life Scores   Health/Function Pre 16.12 %    Socioeconomic Pre 21.17 %    Psych/Spiritual Pre 21.21 %    Family Pre 27.6 %    GLOBAL Pre 20.1 %         Scores of 19 and below usually indicate a poorer quality of life in these areas.  A difference of  2-3 points is a clinically meaningful difference.  A difference of 2-3 points in the total score of the Quality of Life Index has been associated with significant improvement in overall quality of life, self-image, physical symptoms, and general health in studies assessing change in quality of life.  PHQ-9: Review Flowsheet       08/05/2024  Depression screen PHQ 2/9  Decreased Interest 0  Down, Depressed, Hopeless 0  PHQ - 2 Score 0  Altered sleeping 1  Tired, decreased energy 3  Change in appetite 0  Feeling bad or failure about yourself  0  Trouble concentrating 0  Moving slowly or fidgety/restless 0  Suicidal thoughts 0  PHQ-9 Score 4  Difficult doing work/chores Somewhat difficult   Interpretation of Total Score  Total Score Depression Severity:  1-4 = Minimal depression, 5-9 = Mild depression, 10-14 = Moderate depression, 15-19 = Moderately severe depression, 20-27 = Severe depression   Psychosocial Evaluation and Intervention:  Psychosocial Evaluation - 07/01/24 1200       Psychosocial Evaluation & Interventions   Interventions Encouraged to exercise with the program and follow exercise prescription;Stress management education;Relaxation education    Comments 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 He states he has been healing well and is ready to start getting closer to getting back to the golf [place]. His sleep has been a recent concern post CABG and then two  weeks after his surgery he had to have a pacemaker implanted. His wife helps him manage his health care, she is a engineer, civil (consulting). He enjoys going to his beach place and golfing. He had a trip planned to [place], but had to be postponed until May because of his heart surgery. He notes that he would not have been able to walk and keep up had he gone prior to having surgery, so he is thankful they didn't go and is looking forward to the new trip dates because he feels like he will be ready to enjoy the trip more. He also plans on being in his son's wedding in April and that is a big motivation for him to get stronger. He reports no stress concerns and is ready to start the program    Expected Outcomes Short: attend cardiac rehab for education and exercise Long: develop and maintain positive self care habits    Continue Psychosocial Services  Follow up required by staff          Psychosocial Re-Evaluation:  Psychosocial Re-Evaluation     Row Name 08/05/24 [phone]             Psychosocial Re-Evaluation   Current issues with Current Stress Concerns       Comments Chase Harris reports that he doesn't sleep well, but his sleep patterns have been this way for a while. He states he manages it ok. He has no concerns with mental health and reports that he manages stress well. He continues to have a strong support system.       Expected Outcomes Short: continue to attend cardiac rehab for mental health benefits of exercise. Long: maintain good mental health routine upon graduation from cardiac rehab.       Interventions Encouraged to attend Cardiac Rehabilitation for the exercise       Continue Psychosocial Services  Follow up required by staff          Psychosocial Discharge (Final Psychosocial Re-Evaluation):  Psychosocial Re-Evaluation - 08/05/24 0958       Psychosocial Re-Evaluation   Current issues with Current Stress Concerns    Comments Chase Harris reports that he doesn't sleep well, but his sleep patterns have been  this way for a while. He states he manages it ok. He has no concerns with mental health and reports that he manages stress well. He continues to have a strong support system.    Expected Outcomes Short: continue to attend cardiac rehab for mental health benefits of exercise. Long: maintain good mental health routine upon graduation from cardiac rehab.    Interventions Encouraged to attend Cardiac Rehabilitation for the exercise    Continue Psychosocial Services  Follow up required by staff          Vocational Rehabilitation: Provide vocational rehab assistance to qualifying candidates.   Vocational Rehab Evaluation & Intervention:  Vocational Rehab - 07/01/24 0945       Initial Vocational Rehab Evaluation & Intervention   Assessment shows need for Vocational Rehabilitation No  Education: Education Goals: Education classes will be provided on a variety of topics geared toward better understanding of heart health and risk factor modification. Participant will state understanding/return demonstration of topics presented as noted by education test scores.  Learning Barriers/Preferences:  Learning Barriers/Preferences - 07/01/24 0942       Learning Barriers/Preferences   Learning Barriers None    Learning Preferences Individual Instruction          General Cardiac Education Topics:  AED/CPR: - Group verbal and written instruction with the use of models to demonstrate the basic use of the AED with the basic ABC's of resuscitation.   Test and Procedures: - Group verbal and visual presentation and models provide information about basic cardiac anatomy and function. Reviews the testing methods done to diagnose heart disease and the outcomes of the test results. Describes the treatment choices: Medical Management, Angioplasty, or Coronary Bypass Surgery for treating various heart conditions including Myocardial Infarction, Angina, Valve Disease, and Cardiac Arrhythmias.  Written material provided at class time. Flowsheet Row Cardiac Rehab from 10/02/2024 in [facility] Cardiac and Pulmonary Rehab  Date 10/02/24  Educator kb  Instruction Review Code 1- Verbalizes Understanding    Medication Safety: - Group verbal and visual instruction to review commonly prescribed medications for heart and lung disease. Reviews the medication, class of the drug, and side effects. Includes the steps to properly store meds and maintain the prescription regimen. Written material provided at class time.   Intimacy: - Group verbal instruction through game format to discuss how heart and lung disease can affect sexual intimacy. Written material provided at class time. Flowsheet Row Cardiac Rehab from 10/02/2024 in [facility] Cardiac and Pulmonary Rehab  Date 09/16/24  Educator mb  Instruction Review Code 1- Verbalizes Understanding    Know Your Numbers and Heart Failure: - Group verbal and visual instruction to discuss disease risk factors for cardiac and pulmonary disease and treatment options.  Reviews associated critical values for Overweight/Obesity, Hypertension, Cholesterol, and Diabetes.  Discusses basics of heart failure: signs/symptoms and treatments.  Introduces Heart Failure Zone chart for action plan for heart failure. Written material provided at class time.   Infection Prevention: - Provides verbal and written material to individual with discussion of infection control including proper hand washing and proper equipment cleaning during exercise session. Flowsheet Row Cardiac Rehab from 10/02/2024 in [facility] Cardiac and Pulmonary Rehab  Date 07/01/24  Educator [facility]  Instruction Review Code 1- Verbalizes Understanding    Falls Prevention: - Provides verbal and written material to individual with discussion of falls prevention and safety. Flowsheet Row Cardiac Rehab from 10/02/2024 in [facility] Cardiac and Pulmonary Rehab  Date 07/01/24  Educator [facility]  Instruction Review Code 1- Verbalizes  Understanding    Other: -Provides group and verbal instruction on various topics (see comments)   Knowledge Questionnaire Score:  Knowledge Questionnaire Score - 07/01/24 1338       Knowledge Questionnaire Score   Pre Score 25/26          Core Components/Risk Factors/Patient Goals at Admission:  Personal Goals and Risk Factors at Admission - 07/01/24 0940       Core Components/Risk Factors/Patient Goals on Admission    Weight Management Yes;Weight Loss    Intervention Weight Management: Develop a combined nutrition and exercise program designed to reach desired caloric intake, while maintaining appropriate intake of nutrient and fiber, sodium and fats, and appropriate energy expenditure required for the weight goal.;Weight Management: Provide education and appropriate resources to help participant  work on and attain dietary goals.;Weight Management/Obesity: Establish reasonable short term and long term weight goals.;Obesity: Provide education and appropriate resources to help participant work on and attain dietary goals.    Admit Weight 255 lb (115.7 kg)    Goal Weight: Long Term 225 lb (102.1 kg)    Expected Outcomes Short Term: Continue to assess and modify interventions until short term weight is achieved;Long Term: Adherence to nutrition and physical activity/exercise program aimed toward attainment of established weight goal;Weight Loss: Understanding of general recommendations for a balanced deficit meal plan, which promotes 1-2 lb weight loss per week and includes a negative energy balance of [phone] kcal/d;Understanding recommendations for meals to include 15-35% energy as protein, 25-35% energy from fat, 35-60% energy from carbohydrates, less than 200mg  of dietary cholesterol, 20-35 gm of total fiber daily;Understanding of distribution of calorie intake throughout the day with the consumption of 4-5 meals/snacks    Diabetes Yes    Intervention Provide education about  signs/symptoms and action to take for hypo/hyperglycemia.;Provide education about proper nutrition, including hydration, and aerobic/resistive exercise prescription along with prescribed medications to achieve blood glucose in normal ranges: Fasting glucose 65-99 mg/dL    Expected Outcomes Short Term: Participant verbalizes understanding of the signs/symptoms and immediate care of hyper/hypoglycemia, proper foot care and importance of medication, aerobic/resistive exercise and nutrition plan for blood glucose control.;Long Term: Attainment of HbA1C < 7%.    Hypertension Yes    Intervention Monitor prescription use compliance.;Provide education on lifestyle modifcations including regular physical activity/exercise, weight management, moderate sodium restriction and increased consumption of fresh fruit, vegetables, and low fat dairy, alcohol moderation, and smoking cessation.    Expected Outcomes Short Term: Continued assessment and intervention until BP is < 140/40mm HG in hypertensive participants. < 130/57mm HG in hypertensive participants with diabetes, heart failure or chronic kidney disease.;Long Term: Maintenance of blood pressure at goal levels.    Lipids Yes    Intervention Provide education and support for participant on nutrition & aerobic/resistive exercise along with prescribed medications to achieve LDL 70mg , HDL >40mg .    Expected Outcomes Short Term: Participant states understanding of desired cholesterol values and is compliant with medications prescribed. Participant is following exercise prescription and nutrition guidelines.;Long Term: Cholesterol controlled with medications as prescribed, with individualized exercise RX and with personalized nutrition plan. Value goals: LDL < 70mg , HDL > 40 mg.          Education:Diabetes - Individual verbal and written instruction to review signs/symptoms of diabetes, desired ranges of glucose level fasting, after meals and with exercise.  Acknowledge that pre and post exercise glucose checks will be done for 3 sessions at entry of program. Flowsheet Row Cardiac Rehab from 10/02/2024 in [facility] Cardiac and Pulmonary Rehab  Date 07/01/24  Educator [facility]  Instruction Review Code 1- Verbalizes Understanding    Core Components/Risk Factors/Patient Goals Review:   Goals and Risk Factor Review     Row Name 08/05/24 [phone]             Core Components/Risk Factors/Patient Goals Review   Personal Goals Review Lipids;Diabetes;Hypertension;Weight Management/Obesity       Review Chase Harris states that his weight has gone up a little, but he was on vacation and ate out alot. He has been trying to eat at home more to manage that. He does report that he has a BP cuff at home but does not check it regulalrly. He was encouraged to start checking it several times a week to get into the  habit of keeping an eye on it. He also monitors blood sugar at home and reports it is mostly in acceptable ranges. He reports that he takes all medicaiton from DM, BP, and cholesterol. He follows up regularly with his doctor for lab work and check ups to manage his risk factors.       Expected Outcomes Short: start checking BP at home. Long: control cardiac risk factors and keep working towards weight loss goal of 245 lbs.          Core Components/Risk Factors/Patient Goals at Discharge (Final Review):   Goals and Risk Factor Review - 08/05/24 0949       Core Components/Risk Factors/Patient Goals Review   Personal Goals Review Lipids;Diabetes;Hypertension;Weight Management/Obesity    Review Chase Harris states that his weight has gone up a little, but he was on vacation and ate out alot. He has been trying to eat at home more to manage that. He does report that he has a BP cuff at home but does not check it regulalrly. He was encouraged to start checking it several times a week to get into the habit of keeping an eye on it. He also monitors blood sugar at home and reports it is  mostly in acceptable ranges. He reports that he takes all medicaiton from DM, BP, and cholesterol. He follows up regularly with his doctor for lab work and check ups to manage his risk factors.    Expected Outcomes Short: start checking BP at home. Long: control cardiac risk factors and keep working towards weight loss goal of 245 lbs.          ITP Comments:  ITP Comments     Row Name 07/01/24 1208 07/17/24 0752 07/17/24 1021 08/14/24 0943 09/11/24 0931   ITP Comments Initital orientation and 6 MWT completed.  Initial ITP created and sent for review to Dr. Oneil Pinal, Medical Director. First full day of exercise!  Patient was oriented to gym and equipment including functions, settings, policies, and procedures.  Patient's individual exercise prescription and treatment plan were reviewed.  All starting workloads were established based on the results of the 6 minute walk test done at initial orientation visit.  The plan for exercise progression was also introduced and progression will be customized based on patient's performance and goals. 30 Day review completed. Medical Director ITP review done; changes made as directed and signed approval by Medical Director. New to program. 30 Day review completed. Medical Director ITP review done; changes made as directed and signed approval by Medical Director. 30 Day review completed. Medical Director ITP review done, changes made as directed, and signed approval by Medical Director.    Row Name 10/02/24 1046           ITP Comments Chase Harris graduated today from  rehab with 22 sessions completed.  Details of the patient's exercise prescription and what He needs to do in order to continue the prescription and progress were discussed with patient.  Patient was given a copy of prescription and goals.  Patient verbalized understanding. Chase Harris plans to continue to exercise by attending [employer].          Comments: Discharge ITP     [1]  Current  Outpatient Medications:    acetaminophen  (TYLENOL ) 500 MG tablet, Take 1,500 mg by mouth 2 (two) times daily as needed for headache or fever (pain)., Disp: , Rfl:    allopurinol (ZYLOPRIM) 300 MG tablet, Take 300 mg by mouth daily., Disp: , Rfl:  ELIQUIS  5 MG TABS tablet, Take 1 tablet (5 mg total) by mouth 2 (two) times daily., Disp: 180 tablet, Rfl: 3   etanercept (ENBREL) 50 MG/ML injection, Inject 50 mg into the skin every Wednesday., Disp: , Rfl:    Evolocumab  (REPATHA ) 140 MG/ML SOSY, Inject 140 mg into the skin See admin instructions. Inject 1mL (140mg ) into the skin every other Thursday., Disp: 2.1 mL, Rfl: 3   FARXIGA  10 MG TABS tablet, Take 1 tablet (10 mg total) by mouth daily., Disp: 90 tablet, Rfl: 3   fenofibrate 160 MG tablet, Take 160 mg by mouth daily., Disp: , Rfl:    fluticasone (FLONASE) 50 MCG/ACT nasal spray, Place 2 sprays into the nose daily as needed for allergies or rhinitis. , Disp: , Rfl:    furosemide  (LASIX ) 20 MG tablet, Take 1 tablet (20 mg total) by mouth daily., Disp: 90 tablet, Rfl: 3   gabapentin  (NEURONTIN ) 600 MG tablet, TAKE ONE TABLET (600 MG TOTAL) BY MOUTH THREE TIMES DAILY., Disp: 270 tablet, Rfl: 0   hydroxychloroquine (PLAQUENIL) 200 MG tablet, Take 200 mg by mouth 2 (two) times daily., Disp: , Rfl:    imipramine (TOFRANIL) 50 MG tablet, Take 50 mg by mouth at bedtime., Disp: , Rfl:    insulin  aspart (FIASP  FLEXTOUCH) 100 UNIT/ML FlexTouch Pen, Inject 45 Units into the skin with breakfast, with lunch, and with evening meal., Disp: , Rfl:    insulin  degludec (TRESIBA FLEXTOUCH) 100 UNIT/ML FlexTouch Pen, Inject 140 Units into the skin at bedtime., Disp: , Rfl:    loratadine (CLARITIN) 10 MG tablet, Take 10 mg by mouth daily. , Disp: , Rfl:    metFORMIN (GLUCOPHAGE) 1000 MG tablet, Take 1,000 mg by mouth 2 (two) times a day. , Disp: , Rfl:    metoprolol  tartrate (LOPRESSOR ) 50 MG tablet, Take 1 tablet (50 mg total) by mouth 2 (two) times daily., Disp: 180  tablet, Rfl: 3   Multiple Vitamins-Minerals (MULTIVITAMIN MEN 50+) TABS, Take 1 tablet by mouth daily., Disp: , Rfl:    nystatin  (MYCOSTATIN ) 100000 UNIT/ML suspension, Take 4 mLs by mouth 4 (four) times daily. 10 day course, Disp: , Rfl:    omeprazole (PRILOSEC) 40 MG capsule, Take 40 mg by mouth daily., Disp: , Rfl:    oxyCODONE  (OXY IR/ROXICODONE ) 5 MG immediate release tablet, Take 5 mg by mouth every 4 (four) hours as needed for severe pain (pain score 7-10)., Disp: , Rfl:    predniSONE (DELTASONE) 2.5 MG tablet, Take 2.5 mg by mouth daily with breakfast., Disp: , Rfl:    spironolactone  (ALDACTONE ) 25 MG tablet, Take 1 tablet (25 mg total) by mouth daily., Disp: 90 tablet, Rfl: 3 [2]  Social History Tobacco Use  Smoking Status Former  Smokeless Tobacco Never

## 2024-10-02 NOTE — Progress Notes (Signed)
 Discharge Summary   RYON LAYTON  02/01/1961   Nelwyn graduated today from  rehab with 22 sessions completed.  Details of the patient's exercise prescription and what He needs to do in order to continue the prescription and progress were discussed with patient.  Patient was given a copy of prescription and goals.  Patient verbalized understanding. Julyan plans to continue to exercise by attending [employer].   6 Minute Walk     Row Name 07/01/24 1329 10/02/24 1106       6 Minute Walk   Phase Initial Discharge    Distance 1005 feet 1310 feet    Distance % Change -- 30 %    Distance Feet Change -- 305 ft    Walk Time 6 minutes --    # of Rest Breaks 0 0    MPH 1.9 2.5    METS 2.28 2.72    RPE 13 12    Perceived Dyspnea  0 0    VO2 Peak 7.99 9.5    Symptoms Yes (comment) No    Comments lightheadedness --    Resting HR 74 bpm 98 bpm    Resting BP 126/68 124/64    Resting Oxygen Saturation  96 % 94 %    Exercise Oxygen Saturation  during 6 min walk 96 % 99 %    Max Ex. HR 101 bpm 115 bpm    Max Ex. BP 152/68 142/64    2 Minute Post BP 134/66 --       6 Minute Walk     Row Name 07/01/24 1329 10/02/24 1106       6 Minute Walk   Phase Initial Discharge    Distance 1005 feet 1310 feet    Distance % Change -- 30 %    Distance Feet Change -- 305 ft    Walk Time 6 minutes --    # of Rest Breaks 0 0    MPH 1.9 2.5    METS 2.28 2.72    RPE 13 12    Perceived Dyspnea  0 0    VO2 Peak 7.99 9.5    Symptoms Yes (comment) No    Comments lightheadedness --    Resting HR 74 bpm 98 bpm    Resting BP 126/68 124/64    Resting Oxygen Saturation  96 % 94 %    Exercise Oxygen Saturation  during 6 min walk 96 % 99 %    Max Ex. HR 101 bpm 115 bpm    Max Ex. BP 152/68 142/64    2 Minute Post BP 134/66 --

## 2024-10-02 NOTE — Progress Notes (Signed)
 Daily Session Note  Patient Details  Name: Chase Harris MRN: [id number] Date of Birth: Apr 02, 1961 Referring Provider:   Flowsheet Row Cardiac Rehab from 07/01/2024 in [facility] Cardiac and Pulmonary Rehab  Referring Provider Dr. Evalene Lunger, MD    Encounter Date: 10/02/2024  Check In:  Session Check In - 10/02/24 0934       Check-In   Supervising physician immediately available to respond to emergencies See telemetry face sheet for immediately available ER MD    Location [facility]-Cardiac & Pulmonary Rehab    Staff Present Leita Franks RN,BSN;Maxon Conetta BS, Exercise Physiologist;Joseph Rolinda NORWOOD HARMAN Hobert Best, MS, Exercise Physiologist    Virtual Visit No    Medication changes reported     No    Fall or balance concerns reported    No    Warm-up and Cool-down Performed on first and last piece of equipment    Resistance Training Performed Yes    VAD Patient? No    PAD/SET Patient? No      Pain Assessment   Currently in Pain? No/denies             Tobacco Use History[1]  Goals Met:  Independence with exercise equipment Exercise tolerated well No report of concerns or symptoms today Strength training completed today  Goals Unmet:  Not Applicable  Comments:  Delonta graduated today from  rehab with 22 sessions completed.  Details of the patient's exercise prescription and what He needs to do in order to continue the prescription and progress were discussed with patient.  Patient was given a copy of prescription and goals.  Patient verbalized understanding. Charlton plans to continue to exercise by attending [employer].     Dr. Oneil Pinal is Medical Director for [facility] Cardiac Rehabilitation.  Dr. Fuad Aleskerov is Medical Director for [facility] Pulmonary Rehabilitation.     [1]  Social History Tobacco Use  Smoking Status Former  Smokeless Tobacco Never

## 2024-10-14 ENCOUNTER — Telehealth: Payer: Self-pay | Admitting: Pharmacy Technician

## 2024-10-14 ENCOUNTER — Encounter: Payer: Self-pay | Admitting: Cardiovascular Disease

## 2024-10-14 ENCOUNTER — Other Ambulatory Visit (HOSPITAL_COMMUNITY): Payer: Self-pay

## 2024-10-14 MED ORDER — REPATHA 140 MG/ML ~~LOC~~ SOSY: 140.0000 mg | PREFILLED_SYRINGE | SUBCUTANEOUS | 3 refills | Status: AC

## 2024-10-14 NOTE — Telephone Encounter (Signed)
 Pharmacy Patient Advocate Encounter  Received notification from [employer] that Prior Authorization for Repatha  has been APPROVED from 09/26/24 to 09/25/25. Ran test claim, Copay is $47.00- one month. This test claim was processed through [facility]- copay amounts may vary at other pharmacies due to pharmacy/plan contracts, or as the patient moves through the different stages of their insurance plan.   PA #/Case ID/Reference #: [id number]

## 2024-10-14 NOTE — Telephone Encounter (Signed)
 Pharmacy Patient Advocate Encounter   Received notification from Patient Advice Request messages that prior authorization for Repatha  is required/requested.   Insurance verification completed.   The patient is insured through [employer].   Per test claim: PA required; PA submitted to above mentioned insurance via Latent Key/confirmation #/EOC [id number] Status is pending

## 2024-10-15 ENCOUNTER — Inpatient Hospital Stay

## 2024-10-15 ENCOUNTER — Encounter: Payer: Self-pay | Admitting: Oncology

## 2024-10-15 ENCOUNTER — Inpatient Hospital Stay: Attending: Oncology | Admitting: Oncology

## 2024-10-15 VITALS — BP 119/71 | HR 87 | Temp 98.7°F | Resp 19 | Ht 69.0 in | Wt 275.2 lb

## 2024-10-15 DIAGNOSIS — D509 Iron deficiency anemia, unspecified: Secondary | ICD-10-CM | POA: Insufficient documentation

## 2024-10-15 DIAGNOSIS — E1122 Type 2 diabetes mellitus with diabetic chronic kidney disease: Secondary | ICD-10-CM

## 2024-10-15 DIAGNOSIS — N184 Chronic kidney disease, stage 4 (severe): Secondary | ICD-10-CM

## 2024-10-15 DIAGNOSIS — Z950 Presence of cardiac pacemaker: Secondary | ICD-10-CM

## 2024-10-15 DIAGNOSIS — I129 Hypertensive chronic kidney disease with stage 1 through stage 4 chronic kidney disease, or unspecified chronic kidney disease: Secondary | ICD-10-CM

## 2024-10-15 NOTE — Progress Notes (Signed)
 New patient; Iron  deficiency anemia - referred by Dr. Douglas.

## 2024-10-15 NOTE — Progress Notes (Signed)
 Secure chat to Twyla Primmer indicating the following: Dr. Melanee asked me to reach out to you regarding this patient.  He's a new patient to us  referred for IDA; I see that he has an annual f/u appointment with you scheduled 11/26/24.  His last colonoscopy as far I can see was with Dr. Geryl on 06/27/2018.  Report reads A small polyp was found in the transverse colon and is probably due for repeat.  Also mentioned and discuss endoscopy if needed. Croley indicated he will get the patient set up.

## 2024-10-15 NOTE — Progress Notes (Signed)
 "  Hematology/Oncology Consult note [facility] [phone] [phone]  Patient Care Team: Alla Amis, MD as PCP - General (Family Medicine) Melanee Annah BROCKS, MD as Consulting Physician (Oncology)   Name of the patient: Chase Harris  [id number]  [birth year]    Reason for referral-anemia   Referring physician-Dr. Douglas  Date of visit: 10/15/24   History of presenting illness-patient is a 64 year old male with a past medical history significant for hypertension, hyperlipidemia, type 2 diabetes, stage IV CKD who has been referred for anemia.  CBC from 10/03/2024 showed white count of 7.3, H&H of 12.5/41.6 with an MCV of 73.2 and a platelet count of 348.  Iron  studies showed elevated TIBC of 557 with an iron  saturation of 6%.  Ferritin levels were not checked at that time.  Looking back at his prior CBCs his hemoglobin last year has been mostly between 14.5-15.5.  He had developed worsening microcytosis in September 2025.  He has not experienced fatigue, and his energy level has not improved with oral iron  supplementation.He took Thorne iron  supplements for approximately six weeks as recommended by another provider, but home testing after this period continued to show low iron  levels.  He denies gastrointestinal blood loss, including hematochezia, melena, or black tarry stools. He underwent colonoscopy five to six years ago. His primary care provider has discussed scheduling a repeat colonoscopy, but he has not yet been contacted regarding this.  He underwent quadruple coronary artery bypass and pacemaker implantation in August of last year. He first noted the decline in hemoglobin following these procedures and does not recall similar issues prior to that.       ECOG PS- 1  Pain scale- 0   Review of systems- Review of Systems  Constitutional:  Positive for malaise/fatigue. Negative for chills, fever and weight loss.  HENT:  Negative  for congestion, ear discharge and nosebleeds.   Eyes:  Negative for blurred vision.  Respiratory:  Negative for cough, hemoptysis, sputum production, shortness of breath and wheezing.   Cardiovascular:  Negative for chest pain, palpitations, orthopnea and claudication.  Gastrointestinal:  Negative for abdominal pain, blood in stool, constipation, diarrhea, heartburn, melena, nausea and vomiting.  Genitourinary:  Negative for dysuria, flank pain, frequency, hematuria and urgency.  Musculoskeletal:  Negative for back pain, joint pain and myalgias.  Skin:  Negative for rash.  Neurological:  Negative for dizziness, tingling, focal weakness, seizures, weakness and headaches.  Endo/Heme/Allergies:  Does not bruise/bleed easily.  Psychiatric/Behavioral:  Negative for depression and suicidal ideas. The patient does not have insomnia.     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   Chronic venous insufficiency 11/22/2021   Plantar fasciitis 10/11/2019   Sepsis (HCC) 04/07/2019   Diabetic retinopathy associated with type 2 diabetes mellitus (HCC) 03/01/2019   Localized osteoarthritis of right knee 05/22/2018   Chronic pain of right knee 01/22/2018   Varicose veins of leg with pain, bilateral 10/17/2017   Painful orthopaedic hardware 04/17/2017   DM type 2 with diabetic peripheral neuropathy (HCC) 02/13/2017   Achilles tendinitis of left lower extremity 09/22/2016   Vaccine counseling 09/09/2016   Atherosclerosis of native arteries of extremity with intermittent claudication 08/23/2016   Essential hypertension with goal blood pressure less than 140/90 08/22/2016  History of prostate cancer 08/22/2016   IDDM (insulin  dependent diabetes mellitus) 08/22/2016   Mixed  hyperlipidemia 08/22/2016   Non morbid obesity due to excess calories 08/22/2016   Sleep apnea 08/22/2016   Trigger little finger of right hand 06/13/2016   Encounter for long-term (current) use of high-risk medication 04/04/2016   Bilateral hand pain 03/21/2016   Inflammatory arthritis 03/21/2016   Type 2 diabetes mellitus (HCC) 03/21/2016   Seronegative rheumatoid arthritis (HCC) 03/21/2016   Headache 03/17/2015     Past Medical History:  Diagnosis Date   Arthritis    Chronic kidney disease    Constipation    Diabetes mellitus without complication (HCC)    Metformin   Diabetic peripheral neuropathy (HCC)    GERD (gastroesophageal reflux disease)    History of prostate cancer    Hypertension    Peripheral vascular disease    Prostate cancer (HCC)    Sleep apnea    has cpap   Stroke [facility])    mini stroke 2016 - no deficitis     Past Surgical History:  Procedure Laterality Date   COLONOSCOPY WITH PROPOFOL  N/A 06/27/2018   Procedure: COLONOSCOPY WITH PROPOFOL ;  Surgeon: Viktoria Lamar DASEN, MD;  Location: [facility] ENDOSCOPY;  Service: Endoscopy;  Laterality: N/A;   CORONARY ARTERY BYPASS GRAFT     ESOPHAGOGASTRODUODENOSCOPY (EGD) WITH PROPOFOL  N/A 06/27/2018   Procedure: ESOPHAGOGASTRODUODENOSCOPY (EGD) WITH PROPOFOL ;  Surgeon: Viktoria Lamar DASEN, MD;  Location: [facility] ENDOSCOPY;  Service: Endoscopy;  Laterality: N/A;   HERNIA REPAIR     PACEMAKER IMPLANT N/A 06/18/2024   Procedure: PACEMAKER IMPLANT;  Surgeon: Inocencio Soyla Lunger, MD;  Location: [facility] INVASIVE CV LAB;  Service: Cardiovascular;  Laterality: N/A;   PROSTATECTOMY     TAYLOR BUNIONECTOMY     TEMPORARY PACEMAKER N/A 06/16/2024   Procedure: TEMPORARY PACEMAKER;  Surgeon: Mady Bruckner, MD;  Location: [facility] INVASIVE CV LAB;  Service: Cardiovascular;  Laterality: N/A;   TOTAL KNEE ARTHROPLASTY Left 02/10/2023   Procedure: TOTAL KNEE ARTHROPLASTY;  Surgeon: Kay Kemps, MD;  Location: [facility] ORS;  Service: Orthopedics;   Laterality: Left;  choice with interscalene block   WISDOM TOOTH EXTRACTION      Social History   Socioeconomic History   Marital status: Married    Spouse name: Lexicographer   Number of children: 2   Years of education: Not on file   Highest education level: Not on file  Occupational History   Occupation: Retired  Tobacco Use   Smoking status: Former   Smokeless tobacco: Never  Vaping Use   Vaping status: Never Used  Substance and Sexual Activity   Alcohol use: Yes    Alcohol/week: 4.0 standard drinks of alcohol    Types: 2 Cans of beer, 2 Shots of liquor per week    Comment: on occas   Drug use: No   Sexual activity: Yes  Other Topics Concern   Not on file  Social History Narrative   Not on file   Social Drivers of Health   Tobacco Use: Medium Risk (10/15/2024)   Patient History    Smoking Tobacco Use: Former    Smokeless Tobacco Use: Never    Passive Exposure: Not on file  Financial Resource Strain: Low Risk  (08/21/2024)   Received from [facility]   Overall Financial Resource Strain (CARDIA)    Difficulty of Paying Living Expenses: Not hard at all  Food Insecurity: No Food Insecurity (10/15/2024)   Epic    Worried About  Running Out of Food in the Last Year: Never true    Ran Out of Food in the Last Year: Never true  Transportation Needs: No Transportation Needs (10/15/2024)   Epic    Lack of Transportation (Medical): No    Lack of Transportation (Non-Medical): No  Physical Activity: Insufficiently Active (05/08/2024)   Received from [facility]   Exercise Vital Sign    On average, how many days per week do you engage in moderate to strenuous exercise (like a brisk walk)?: 3 days    On average, how many minutes do you engage in exercise at this level?: 30 min  Stress: Patient Declined (05/08/2024)   Received from [facility] of Occupational Health - Occupational Stress Questionnaire    Feeling of Stress : Patient  declined  Social Connections: Moderately Integrated (05/08/2024)   Received from [facility]   Social Connection and Isolation Panel    In a typical week, how many times do you talk on the phone with family, friends, or neighbors?: More than three times a week    How often do you get together with friends or relatives?: More than three times a week    How often do you attend church or religious services?: More than 4 times per year    Do you belong to any clubs or organizations such as church groups, unions, fraternal or athletic groups, or school groups?: No    How often do you attend meetings of the clubs or organizations you belong to?: Never    Are you married, widowed, divorced, separated, never married, or living with a partner?: Married  Intimate Partner Violence: Not At Risk (10/15/2024)   Epic    Fear of Current or Ex-Partner: No    Emotionally Abused: No    Physically Abused: No    Sexually Abused: No  Depression (PHQ2-9): Low Risk (10/15/2024)   Depression (PHQ2-9)    PHQ-2 Score: 0  Alcohol Screen: Not on file  Housing: Low Risk (10/15/2024)   Epic    Unable to Pay for Housing in the Last Year: No    Number of Times Moved in the Last Year: 0    Homeless in the Last Year: No  Utilities: Not At Risk (10/15/2024)   Epic    Threatened with loss of utilities: No  Health Literacy: Adequate Health Literacy (05/06/2024)   Received from [facility]   B1300 Health Literacy    Frequency of need for help with medical instructions: Never     Family History  Problem Relation Age of Onset   Hypothyroidism Mother    Congestive Heart Failure Father    Hypertension Father    Kidney disease Father    Diabetes Paternal Aunt    Diabetes Paternal Grandmother     Current Medications[2]   Physical exam:  Vitals:   10/15/24 1107  BP: 119/71  Pulse: 87  Resp: 19  Temp: 98.7 F (37.1 C)  TempSrc: Tympanic  SpO2: 95%  Weight: 275 lb 3.2 oz (124.8 kg)  Height: 5' 9 (1.753 m)    Physical Exam Cardiovascular:     Rate and Rhythm: Normal rate and regular rhythm.     Heart sounds: Normal heart sounds.  Pulmonary:     Effort: Pulmonary effort is normal.     Breath sounds: Normal breath sounds.  Skin:    General: Skin is warm and dry.  Neurological:     Mental Status: He is alert and oriented  to person, place, and time.           Latest Ref Rng & Units 06/19/2024    4:59 AM  CMP  Glucose 70 - 99 mg/dL 857   BUN 8 - 23 mg/dL 21   Creatinine 9.38 - 1.24 mg/dL 8.48   Sodium 864 - 854 mmol/L 136   Potassium 3.5 - 5.1 mmol/L 4.1   Chloride 98 - 111 mmol/L 102   CO2 22 - 32 mmol/L 23   Calcium 8.9 - 10.3 mg/dL 8.9       Latest Ref Rng & Units 06/19/2024    4:59 AM  CBC  WBC 4.0 - 10.5 K/uL 7.6   Hemoglobin 13.0 - 17.0 g/dL 88.5   Hematocrit 60.9 - 52.0 % 38.4   Platelets 150 - 400 K/uL 325      Assessment and plan- Patient is a 64 y.o. male referred for iron  deficiency anemia  Assessment and Plan    Iron  deficiency anemia Chronic iron  deficiency anemia with hemoglobin decreased to 12 g/dL, confirmed by low iron  saturation and lack of response to oral iron . Stage 4 chronic kidney disease likely impairs absorption and contributes to inflammation. Planned repeat laboratory evaluation in three months, including iron  studies, thyroid function, vitamin B12, and folic acid. - Offered intravenous iron  infusions due to ineffective oral iron  and impaired absorption from chronic kidney disease; brand selection to be determined by insurance coverage. - Scheduled IV iron  infusions at his convenience.  Plan for Venofer  200 mg x 5 doses - Planned repeat laboratory evaluation in three months, including iron  studies, thyroid function, vitamin B12, and folic acid. - Discussed rare risk of allergic or anaphylactic reaction during infusion; nursing staff will monitor and manage. - Will contact gastroenterology to discuss need for further evaluation, including possible  colonoscopy. - Documented shared decision making; he agreed to proceed with iron  infusions.         Thank you for this kind referral and the opportunity to participate in the care of this patient   Visit Diagnosis 1. Iron  deficiency anemia, unspecified iron  deficiency anemia type     Dr. Annah Skene, MD, MPH [facility] [phone] 10/15/2024                    [1]  Allergies Allergen Reactions   Vibramycin  [Doxycycline ] Hives and Diarrhea   Penicillins Rash    Childhood reaction   Dynabac [Dirithromycin] Nausea And Vomiting and Other (See Comments)    Abdominal pain  [2]  Current Outpatient Medications:    acetaminophen  (TYLENOL ) 500 MG tablet, Take 1,500 mg by mouth 2 (two) times daily as needed for headache or fever (pain)., Disp: , Rfl:    allopurinol (ZYLOPRIM) 300 MG tablet, Take 300 mg by mouth daily., Disp: , Rfl:    ELIQUIS  5 MG TABS tablet, Take 1 tablet (5 mg total) by mouth 2 (two) times daily., Disp: 180 tablet, Rfl: 3   etanercept (ENBREL) 50 MG/ML injection, Inject 50 mg into the skin every Wednesday., Disp: , Rfl:    Evolocumab  (REPATHA ) 140 MG/ML SOSY, Inject 140 mg into the skin every 14 (fourteen) days. Inject 1mL (140mg ) into the skin every other Thursday., Disp: 2.1 mL, Rfl: 3   FARXIGA  10 MG TABS tablet, Take 1 tablet (10 mg total) by mouth daily., Disp: 90 tablet, Rfl: 3   fenofibrate 160 MG tablet, Take 160 mg by mouth daily., Disp: , Rfl:    fluticasone (  FLONASE) 50 MCG/ACT nasal spray, Place 2 sprays into the nose daily as needed for allergies or rhinitis. , Disp: , Rfl:    furosemide  (LASIX ) 20 MG tablet, Take 1 tablet (20 mg total) by mouth daily., Disp: 90 tablet, Rfl: 3   gabapentin  (NEURONTIN ) 600 MG tablet, TAKE ONE TABLET (600 MG TOTAL) BY MOUTH THREE TIMES DAILY., Disp: 270 tablet, Rfl: 0   hydroxychloroquine (PLAQUENIL) 200 MG tablet, Take 200 mg by mouth 2 (two) times daily., Disp: , Rfl:     imipramine (TOFRANIL) 50 MG tablet, Take 50 mg by mouth at bedtime., Disp: , Rfl:    insulin  aspart (FIASP  FLEXTOUCH) 100 UNIT/ML FlexTouch Pen, Inject 45 Units into the skin with breakfast, with lunch, and with evening meal., Disp: , Rfl:    insulin  degludec (TRESIBA FLEXTOUCH) 100 UNIT/ML FlexTouch Pen, Inject 140 Units into the skin at bedtime., Disp: , Rfl:    loratadine (CLARITIN) 10 MG tablet, Take 10 mg by mouth daily. , Disp: , Rfl:    metFORMIN (GLUCOPHAGE) 1000 MG tablet, Take 1,000 mg by mouth 2 (two) times a day. , Disp: , Rfl:    metoprolol  tartrate (LOPRESSOR ) 50 MG tablet, Take 1 tablet (50 mg total) by mouth 2 (two) times daily., Disp: 180 tablet, Rfl: 3   Multiple Vitamins-Minerals (MULTIVITAMIN MEN 50+) TABS, Take 1 tablet by mouth daily., Disp: , Rfl:    omeprazole (PRILOSEC) 40 MG capsule, Take 40 mg by mouth daily., Disp: , Rfl:    predniSONE (DELTASONE) 2.5 MG tablet, Take 2.5 mg by mouth daily with breakfast., Disp: , Rfl:    REPATHA  SURECLICK 140 MG/ML SOAJ, Inject into the skin., Disp: , Rfl:    spironolactone  (ALDACTONE ) 25 MG tablet, Take 1 tablet (25 mg total) by mouth daily., Disp: 90 tablet, Rfl: 3   telmisartan (MICARDIS) 20 MG tablet, Take 20 mg by mouth., Disp: , Rfl:    insulin  lispro (HUMALOG) 100 UNIT/ML KwikPen, Inject 50 Units into the skin. (Patient not taking: Reported on 10/15/2024), Disp: , Rfl:    nystatin  (MYCOSTATIN ) 100000 UNIT/ML suspension, Take 4 mLs by mouth 4 (four) times daily. 10 day course (Patient not taking: Reported on 10/15/2024), Disp: , Rfl:    oxyCODONE  (OXY IR/ROXICODONE ) 5 MG immediate release tablet, Take 5 mg by mouth every 4 (four) hours as needed for severe pain (pain score 7-10). (Patient not taking: Reported on 10/15/2024), Disp: , Rfl:   "

## 2024-10-16 ENCOUNTER — Encounter: Payer: Self-pay | Admitting: Oncology

## 2024-10-17 ENCOUNTER — Inpatient Hospital Stay

## 2024-10-17 VITALS — BP 133/66 | HR 87 | Temp 97.2°F | Resp 18

## 2024-10-17 DIAGNOSIS — D509 Iron deficiency anemia, unspecified: Secondary | ICD-10-CM | POA: Diagnosis not present

## 2024-10-17 MED ORDER — IRON SUCROSE 20 MG/ML IV SOLN
200.0000 mg | INTRAVENOUS | Status: DC
  Administered 2024-10-17: 200 mg via INTRAVENOUS

## 2024-10-17 NOTE — Patient Instructions (Signed)

## 2024-10-21 ENCOUNTER — Ambulatory Visit: Payer: Self-pay | Admitting: Cardiology

## 2024-10-23 ENCOUNTER — Ambulatory Visit: Admitting: Cardiology

## 2024-10-24 ENCOUNTER — Telehealth: Payer: Self-pay | Admitting: Oncology

## 2024-10-24 NOTE — Telephone Encounter (Signed)
 Pt called and lvm asking to reschedule his iron  infusion he had scheduled for Monday. I called him back and helped him get rescheduled

## 2024-10-28 ENCOUNTER — Inpatient Hospital Stay

## 2024-10-28 NOTE — Progress Notes (Unsigned)
 "     Electrophysiology Clinic Note    Date:  10/29/2024  Patient ID:  Chase Harris, Chase Harris [birth year], MRN [id number] PCP:  Alla Amis, MD  Cardiologist:  Evalene Lunger, MD  Electrophysiologist:  Soyla Gladis Norton, MD  Electrophysiology APP:  Lashawndra Lampkins, NP    Discussed the use of AI scribe software for clinical note transcription with the patient, who gave verbal consent to proceed.   Patient Profile    Chief Complaint: PPM follow-up  History of Present Illness: Chase Harris is a 64 y.o. male with PMH notable for CAD s/p 4v CABG (2025), post-op AFib, HTN, HLD, T2DM, CKD, OSA on CPAP, RA, TIA, CVA; seen today for Will Gladis Norton, MD for routine electrophysiology follow-up s/p Pacemaker implant.  Presented to ER 05/2024 with pre-syncope, found to be in intermittent 3rd deg HB and underwent TVP while awaiting amio, dilt, and metop wash out. He continued to have periods of CHB, so is now s/p PPM implant. Amio and dilt were both stopped at discharge.   Today, he has some localized tenderness at his PPM implant site, believes it is slowly improving. He also some some tingling into his L arm, but also improving.   He is not aware of any AF episodes, continues to take eliquis  BID without significant bleeding noted. He was recently found to have IDA, rec'd his first iron  infusion last week, planning for 4 more. He feels much less fatigued since infusion.   He continues to use CPAP nightly.   Arrhythmia/Device History St. Jude dual chamber PPM, imp 05/2024; dx CHB    ROS:  Please see the history of present illness. All other systems are reviewed and otherwise negative.    Physical Exam    VS:  BP 130/72 (BP Location: Left Arm, Patient Position: Sitting, Cuff Size: Normal)   Pulse (!) 104   Ht 5' 9 (1.753 m)   Wt 276 lb (125.2 kg)   SpO2 97%   BMI 40.76 kg/m  BMI: Body mass index is 40.76 kg/m.           Wt Readings from Last 3 Encounters:   10/29/24 276 lb (125.2 kg)  10/15/24 275 lb 3.2 oz (124.8 kg)  10/02/24 273 lb 12.8 oz (124.2 kg)     GEN- The patient is well appearing, alert and oriented x 3 today.   Lungs- Clear to ausculation bilaterally, normal work of breathing.  Heart- Regular rate and rhythm, no murmurs, rubs or gallops Extremities- No peripheral edema, warm, dry Skin-  device pocket well-healed, no tethering   Device interrogation done today and reviewed by myself:  Battery good Lead thresholds, impedence, sensing stable  Atrial auto-capture with threshold at  0.75V at 0.11ms  Presents AS-VP, increased SAV delay to allow intrinsic Several brief AF episodes, overall burden < 1%   Increased sensed AV delay as above, no other changes made today   Studies Reviewed   Previous EP, cardiology notes.    EKG is ordered. Personal review of EKG from today shows:    EKG Interpretation Date/Time:  Tuesday October 29 2024 09:58:19 EST Ventricular Rate:  104 PR Interval:  204 QRS Duration:  136 QT Interval:  378 QTC Calculation: 497 R Axis:   85  Text Interpretation: Sinus tachycardia Right bundle branch block Confirmed by Chase Harris [phone]) on 10/29/2024 10:08:05 AM     TTE, 06/17/2024  1. Left ventricular ejection fraction, by estimation, is 60 to 65%. The left ventricle has  normal function. The left ventricle has no regional wall motion abnormalities. Left ventricular diastolic function could not be evaluated.   2. Right ventricular systolic function is normal. The right ventricular size is normal.   3. The mitral valve is normal in structure. No evidence of mitral valve regurgitation. No evidence of mitral stenosis.   4. The aortic valve is normal in structure. Aortic valve regurgitation is not visualized. No aortic stenosis is present.   5. The inferior vena cava is dilated in size with <50% respiratory variability, suggesting right atrial pressure of 15 mmHg.     Assessment and Plan     #)  CHB s/p PPM S/p recent PPM implant Intermittently dependent. Increased sensed AV delay to allow intrinsic to come through.  Histograms shifted right Lead measurements stable   #) parox afib Overall low burden Amiodarone stopped 07/2024   #) Hypercoag d/t parox afib CHA2DS2-VASc Score = at least 5 [CHF History: 0, HTN History: 1, Diabetes History: 1, Stroke History: 2, Vascular Disease History: 1, Age Score: 0, Gender Score: 0].  Therefore, the patient's annual risk of stroke is 7.2 %.    Stroke ppx - 5mg  eliquis  BID, appropriately dosed No bleeding concerns  #) IDA #) tachycardia Recently started iron  infusion Will defer increasing BB at this time until has completed more Fe infusions   #) CAD s/p CABG No ischemic s/s  Continue to monitor    Current medicines are reviewed at length with the patient today.   The patient does not have concerns regarding his medicines.  The following changes were made today:  none  Labs/ tests ordered today include:  Orders Placed This Encounter  Procedures   EKG 12-Lead     Disposition: Follow up with Dr. Inocencio or EP APP in 6 months, continue remote monitoring  Follow-up with general cardiology in about 1-2 months   Signed, Alice Vitelli, NP  10/29/24  12:42 PM  Electrophysiology [facility] "

## 2024-10-29 ENCOUNTER — Ambulatory Visit: Admitting: Cardiology

## 2024-10-29 ENCOUNTER — Encounter: Payer: Self-pay | Admitting: Cardiology

## 2024-10-29 VITALS — BP 130/72 | HR 104 | Ht 69.0 in | Wt 276.0 lb

## 2024-10-29 DIAGNOSIS — I442 Atrioventricular block, complete: Secondary | ICD-10-CM

## 2024-10-29 DIAGNOSIS — I480 Paroxysmal atrial fibrillation: Secondary | ICD-10-CM

## 2024-10-29 DIAGNOSIS — D6869 Other thrombophilia: Secondary | ICD-10-CM | POA: Diagnosis not present

## 2024-10-29 DIAGNOSIS — Z950 Presence of cardiac pacemaker: Secondary | ICD-10-CM | POA: Diagnosis not present

## 2024-10-29 LAB — CUP PACEART INCLINIC DEVICE CHECK
Battery Remaining Longevity: 120 mo
Battery Voltage: 3.02 V
Brady Statistic RA Percent Paced: 0.11 %
Brady Statistic RV Percent Paced: 45 %
Date Time Interrogation Session: 20260203130744
Implantable Lead Connection Status: 753985
Implantable Lead Connection Status: 753985
Implantable Lead Implant Date: 20250923
Implantable Lead Implant Date: 20250923
Implantable Lead Location: 753859
Implantable Lead Location: 753860
Implantable Pulse Generator Implant Date: 20250923
Lead Channel Impedance Value: 462.5 Ohm
Lead Channel Impedance Value: 525 Ohm
Lead Channel Pacing Threshold Amplitude: 0.75 V
Lead Channel Pacing Threshold Amplitude: 0.75 V
Lead Channel Pacing Threshold Amplitude: 1.25 V
Lead Channel Pacing Threshold Amplitude: 1.25 V
Lead Channel Pacing Threshold Pulse Width: 0.5 ms
Lead Channel Pacing Threshold Pulse Width: 0.5 ms
Lead Channel Pacing Threshold Pulse Width: 0.5 ms
Lead Channel Pacing Threshold Pulse Width: 0.5 ms
Lead Channel Sensing Intrinsic Amplitude: 1.6 mV
Lead Channel Sensing Intrinsic Amplitude: 12 mV
Lead Channel Setting Pacing Amplitude: 1.5 V
Lead Channel Setting Pacing Amplitude: 5 V
Lead Channel Setting Pacing Pulse Width: 0.5 ms
Lead Channel Setting Sensing Sensitivity: 2 mV
Pulse Gen Model: 2272
Pulse Gen Serial Number: 5291451

## 2024-10-29 NOTE — Patient Instructions (Signed)
 Medication Instructions:  Your physician recommends that you continue on your current medications as directed. Please refer to the Current Medication list given to you today.  *If you need a refill on your cardiac medications before your next appointment, please call your pharmacy*  Lab Work: No labs ordered today    Testing/Procedures: No test ordered today   Follow-Up:  Home Blood Pressure Log for General Cardiology.  At [facility], you and your health needs are our priority.  As part of our continuing mission to provide you with exceptional heart care, our providers are all part of one team.  This team includes your primary Cardiologist (physician) and Advanced Practice Providers or APPs (Physician Assistants and Nurse Practitioners) who all work together to provide you with the care you need, when you need it.  Your next appointment:   4 week(s)  Provider:   Evalene Lunger, MD or Lesley Maffucci, PA     Electrophysiology 6 month follow up with Suzann Riddle, NP

## 2024-10-30 ENCOUNTER — Inpatient Hospital Stay

## 2024-10-30 VITALS — BP 132/68 | HR 92 | Temp 97.9°F | Resp 18

## 2024-10-30 DIAGNOSIS — D509 Iron deficiency anemia, unspecified: Secondary | ICD-10-CM

## 2024-10-30 MED ORDER — IRON SUCROSE 20 MG/ML IV SOLN
200.0000 mg | INTRAVENOUS | Status: DC
  Administered 2024-10-30: 200 mg via INTRAVENOUS
  Filled 2024-10-30: qty 10

## 2024-10-30 NOTE — Patient Instructions (Signed)
 Iron  Sucrose Injection What is this medication? IRON  SUCROSE (EYE ern SOO krose) treats low levels of iron  (iron  deficiency anemia) in people with kidney disease. Iron  is a mineral that plays an important role in making red blood cells, which carry oxygen from your lungs to the rest of your body. This medicine may be used for other purposes; ask your health care provider or pharmacist if you have questions. COMMON BRAND NAME(S): Venofer  What should I tell my care team before I take this medication? They need to know if you have any of these conditions: Anemia not caused by low iron  levels Heart disease High levels of iron  in the blood Kidney disease Liver disease An unusual or allergic reaction to iron , other medications, foods, dyes, or preservatives Pregnant or trying to get pregnant Breastfeeding How should I use this medication? This medication is infused into a vein. It is given by your care team in a hospital or clinic setting. Talk to your care team about the use of this medication in children. While it may be prescribed for children as young as 2 years for selected conditions, precautions do apply. Overdosage: If you think you have taken too much of this medicine contact a poison control center or emergency room at once. NOTE: This medicine is only for you. Do not share this medicine with others. What if I miss a dose? Keep appointments for follow-up doses. It is important not to miss your dose. Call your care team if you are unable to keep an appointment. What may interact with this medication? Do not take this medication with any of the following: Deferoxamine Dimercaprol Other iron  products This medication may also interact with the following: Chloramphenicol Deferasirox This list may not describe all possible interactions. Give your health care provider a list of all the medicines, herbs, non-prescription drugs, or dietary supplements you use. Also tell them if you smoke,  drink alcohol, or use illegal drugs. Some items may interact with your medicine. What should I watch for while using this medication? Your condition will be monitored carefully while you are receiving this medication. Tell your care team if your symptoms do not start to get better or if they get worse. You may need blood work done while you are taking this medication. Sometimes, when medications are infused into veins, a little can leak out of the vein and into the tissue around it. If this medication leaks, it can cause a brown or dark stain on the skin. This is not common. It may be permanent. If you feel pain or swelling during your infusion, tell your care team right away. They can stop the infusion and treat the area. You may need to eat more foods that contain iron . Talk to your care team. Foods that contain iron  include whole grains or cereals, dried fruits, beans, peas, leafy green vegetables, and organ meats (liver, kidney). What side effects may I notice from receiving this medication? Side effects that you should report to your care team as soon as possible: Allergic reactions--skin rash, itching, hives, swelling of the face, lips, tongue, or throat Low blood pressure--dizziness, feeling faint or lightheaded, blurry vision Painful swelling, warmth, or redness of the skin, brown or dark skin color at the infusion site Shortness of breath Side effects that usually do not require medical attention (report these to your care team if they continue or are bothersome): Flushing Headache Joint pain Muscle pain Nausea This list may not describe all possible side effects. Call your  doctor for medical advice about side effects. You may report side effects to FDA at 1-800-FDA-1088. Where should I keep my medication? This medication is given in a hospital or clinic. It will not be stored at home. NOTE: This sheet is a summary. It may not cover all possible information. If you have questions about  this medicine, talk to your doctor, pharmacist, or health care provider.  2025 Elsevier/Gold Standard (2024-07-31 00:00:00)

## 2024-10-31 ENCOUNTER — Ambulatory Visit

## 2024-11-01 ENCOUNTER — Inpatient Hospital Stay

## 2024-11-01 VITALS — BP 114/61 | HR 92 | Temp 98.6°F | Resp 18

## 2024-11-01 DIAGNOSIS — D509 Iron deficiency anemia, unspecified: Secondary | ICD-10-CM

## 2024-11-01 LAB — CUP PACEART REMOTE DEVICE CHECK
Battery Remaining Longevity: 100 mo
Battery Remaining Percentage: 95.5 %
Battery Voltage: 3.02 V
Brady Statistic AP VP Percent: 1 %
Brady Statistic AP VS Percent: 2 %
Brady Statistic AS VP Percent: 1 %
Brady Statistic AS VS Percent: 97 %
Brady Statistic RA Percent Paced: 1 %
Brady Statistic RV Percent Paced: 1 %
Date Time Interrogation Session: 20260205020014
Implantable Lead Connection Status: 753985
Implantable Lead Connection Status: 753985
Implantable Lead Implant Date: 20250923
Implantable Lead Implant Date: 20250923
Implantable Lead Location: 753859
Implantable Lead Location: 753860
Implantable Pulse Generator Implant Date: 20250923
Lead Channel Impedance Value: 480 Ohm
Lead Channel Impedance Value: 540 Ohm
Lead Channel Pacing Threshold Amplitude: 0.75 V
Lead Channel Pacing Threshold Amplitude: 1.125 V
Lead Channel Pacing Threshold Pulse Width: 0.5 ms
Lead Channel Pacing Threshold Pulse Width: 0.5 ms
Lead Channel Sensing Intrinsic Amplitude: 1.8 mV
Lead Channel Sensing Intrinsic Amplitude: 12 mV
Lead Channel Setting Pacing Amplitude: 1.375
Lead Channel Setting Pacing Amplitude: 5 V
Lead Channel Setting Pacing Pulse Width: 0.5 ms
Lead Channel Setting Sensing Sensitivity: 2 mV
Pulse Gen Model: 2272
Pulse Gen Serial Number: 5291451

## 2024-11-01 MED ORDER — IRON SUCROSE 20 MG/ML IV SOLN
200.0000 mg | INTRAVENOUS | Status: DC
  Administered 2024-11-01: 200 mg via INTRAVENOUS
  Filled 2024-11-01: qty 10

## 2024-11-01 NOTE — Patient Instructions (Signed)

## 2024-11-04 ENCOUNTER — Inpatient Hospital Stay

## 2024-11-06 ENCOUNTER — Inpatient Hospital Stay

## 2024-11-26 ENCOUNTER — Ambulatory Visit: Admitting: Physician Assistant

## 2025-01-13 ENCOUNTER — Inpatient Hospital Stay: Admitting: Oncology

## 2025-01-13 ENCOUNTER — Inpatient Hospital Stay

## 2025-01-14 ENCOUNTER — Inpatient Hospital Stay: Admitting: Oncology

## 2025-01-14 ENCOUNTER — Inpatient Hospital Stay

## 2025-01-30 ENCOUNTER — Encounter

## 2025-05-01 ENCOUNTER — Encounter

## 2025-07-31 ENCOUNTER — Encounter

## 2025-10-30 ENCOUNTER — Encounter
